# Patient Record
Sex: Male | Born: 1955 | Race: White | Hispanic: No | Marital: Married | Smoking: Current every day smoker
Health system: Southern US, Community
[De-identification: ages and names within clinical notes are randomized; demographics above are authoritative.]

## PROBLEM LIST (undated history)

## (undated) DIAGNOSIS — I252 Old myocardial infarction: Secondary | ICD-10-CM

## (undated) DIAGNOSIS — E785 Hyperlipidemia, unspecified: Secondary | ICD-10-CM

## (undated) DIAGNOSIS — N179 Acute kidney failure, unspecified: Secondary | ICD-10-CM

## (undated) DIAGNOSIS — Z720 Tobacco use: Secondary | ICD-10-CM

## (undated) DIAGNOSIS — I219 Acute myocardial infarction, unspecified: Secondary | ICD-10-CM

## (undated) DIAGNOSIS — Z951 Presence of aortocoronary bypass graft: Secondary | ICD-10-CM

## (undated) DIAGNOSIS — I2510 Atherosclerotic heart disease of native coronary artery without angina pectoris: Secondary | ICD-10-CM

## (undated) HISTORY — DX: Old myocardial infarction: I25.2

## (undated) HISTORY — DX: Hyperlipidemia, unspecified: E78.5

## (undated) HISTORY — DX: Tobacco use: Z72.0

## (undated) HISTORY — DX: Presence of aortocoronary bypass graft: Z95.1

## (undated) HISTORY — PX: CORONARY ARTERY BYPASS GRAFT: SHX141

## (undated) HISTORY — DX: Acute kidney failure, unspecified: N17.9

## (undated) HISTORY — PX: CORONARY ANGIOPLASTY WITH STENT PLACEMENT: SHX49

---

## 2002-10-07 ENCOUNTER — Inpatient Hospital Stay (HOSPITAL_COMMUNITY): Admission: EM | Admit: 2002-10-07 | Discharge: 2002-10-17 | Payer: Self-pay | Admitting: Unknown Physician Specialty

## 2002-10-07 ENCOUNTER — Encounter: Payer: Self-pay | Admitting: Internal Medicine

## 2002-10-07 ENCOUNTER — Encounter: Payer: Self-pay | Admitting: *Deleted

## 2002-10-13 ENCOUNTER — Encounter: Payer: Self-pay | Admitting: Surgery

## 2002-10-14 ENCOUNTER — Encounter: Payer: Self-pay | Admitting: Surgery

## 2002-10-15 ENCOUNTER — Encounter: Payer: Self-pay | Admitting: Surgery

## 2003-11-09 ENCOUNTER — Inpatient Hospital Stay (HOSPITAL_COMMUNITY): Admission: EM | Admit: 2003-11-09 | Discharge: 2003-11-10 | Payer: Self-pay

## 2004-10-02 ENCOUNTER — Ambulatory Visit: Payer: Self-pay

## 2006-09-20 ENCOUNTER — Ambulatory Visit: Payer: Self-pay | Admitting: Cardiology

## 2006-09-20 ENCOUNTER — Observation Stay (HOSPITAL_COMMUNITY): Admission: EM | Admit: 2006-09-20 | Discharge: 2006-09-23 | Payer: Self-pay | Admitting: Emergency Medicine

## 2009-01-10 ENCOUNTER — Encounter: Payer: Self-pay | Admitting: Cardiology

## 2009-01-11 ENCOUNTER — Ambulatory Visit: Payer: Self-pay | Admitting: Cardiology

## 2010-09-10 ENCOUNTER — Ambulatory Visit
Admission: RE | Admit: 2010-09-10 | Discharge: 2010-09-10 | Payer: Self-pay | Source: Home / Self Care | Admitting: Family Medicine

## 2010-09-10 ENCOUNTER — Encounter: Payer: Self-pay | Admitting: Family Medicine

## 2010-09-10 DIAGNOSIS — IMO0002 Reserved for concepts with insufficient information to code with codable children: Secondary | ICD-10-CM | POA: Insufficient documentation

## 2010-09-10 DIAGNOSIS — I252 Old myocardial infarction: Secondary | ICD-10-CM | POA: Insufficient documentation

## 2010-09-12 ENCOUNTER — Telehealth (INDEPENDENT_AMBULATORY_CARE_PROVIDER_SITE_OTHER): Payer: Self-pay | Admitting: *Deleted

## 2010-09-27 NOTE — Letter (Signed)
Summary: Out of Work  [facility]  [address]   [place], [place] [address]   Phone: [phone]  Fax: [phone]    September 10, 2010   Employee:  [employer]    To Whom It May Concern:   For Medical reasons, please excuse the above named employee from work today and tomorrow. He has a musculoskeletal chest injury (no evidence of cardiac related pain).      If you need additional information, please feel free to contact our office.         Sincerely,    Donna Christen MD

## 2010-09-27 NOTE — Assessment & Plan Note (Signed)
Summary: CHEST PAIN/WSE   Vital Signs:  Patient Profile:   55 Years Old Male CC:      sharp right side chest pain x 1pm today Height:     66 inches Weight:      251.8 pounds O2 Sat:      98 % O2 treatment:    Room Air Temp:     98.4 degrees F oral Pulse rate:   76 / minute Resp:     18 per minute BP sitting:   167 / 106  (left arm) Cuff size:   large  Pt. in pain?   yes    Location:   right chest    Intensity:   7    Type:       sharp  Vitals Entered By: Lajean Saver RN (September 10, 2010 8:13 PM)                   Updated Prior Medication List: VIAGRA 50 MG TABS (SILDENAFIL CITRATE) 1 by mouth 1 hour prior to sexual activity FISH OIL 1000 MG CAPS (OMEGA-3 FATTY ACIDS)  ASPIRIN 81 MG TBEC (ASPIRIN)  GARLIC OIL 3 MG CAPS (GARLIC)  BEE POLLEN 580 MG CAPS (BEE POLLEN)  B COMPLEX  TABS (B COMPLEX VITAMINS)  RED YEAST RICE EXTRACT 600 MG CAPS (RED YEAST RICE EXTRACT)   Current Allergies: No known allergies History of Present Illness Chief Complaint: sharp right side chest pain x 1pm today History of Present Illness:  Subjective:  Patient was pushing a wire connector into a receptacle on his truck at about 1PM today with his right hand when he felt a sudden stabbing sharp pain in his right anterior chest.  He took two regular aspirin tabs and took two more about 2 hours later.  The pain recurs with deep inspiration and certain movements of his right arm/shoulder.  No shortness of breath.  The pain is not present when he sitting quietly without movement, breathing quietly.  He states that he had a recent cold that lasted about two weeks, and still has a mild cough.  No fevers, chills, and sweats  He has a past history of MI and cardiac bypass surgery.   He smokes 1/2 pack per day.  He takes no prescription meds except Viagra.  REVIEW OF SYSTEMS Constitutional Symptoms      Denies fever, chills, night sweats, weight loss, weight gain, and fatigue.  Eyes       Denies  change in vision, eye pain, eye discharge, glasses, contact lenses, and eye surgery. Ear/Nose/Throat/Mouth       Denies hearing loss/aids, change in hearing, ear pain, ear discharge, dizziness, frequent runny nose, frequent nose bleeds, sinus problems, sore throat, hoarseness, and tooth pain or bleeding.  Respiratory       Denies dry cough, productive cough, wheezing, shortness of breath, asthma, bronchitis, and emphysema/COPD.  Cardiovascular       Complains of chest pain.      Denies murmurs and tires easily with exhertion.      Comments: right side   Gastrointestinal       Denies stomach pain, nausea/vomiting, diarrhea, constipation, blood in bowel movements, and indigestion. Genitourniary       Denies painful urination, kidney stones, and loss of urinary control. Neurological       Denies paralysis, seizures, and fainting/blackouts. Musculoskeletal       Denies muscle pain, joint pain, joint stiffness, decreased range of motion, redness, swelling, muscle weakness, and gout.  Skin       Denies bruising, unusual mles/lumps or sores, and hair/skin or nail changes.  Psych       Denies mood changes, temper/anger issues, anxiety/stress, speech problems, depression, and sleep problems. Other Comments: Patient c/o sudden right sided sharp chest pain today at 1pm. it is constant. He denies SOB, pain elsewhere and is not diaphoretic. EKG done.    Past History:  Past Medical History: Myocardial infarction, hx of  Past Surgical History: 5 bypass  Family History: Family History Diabetes 1st degree relative- mother Family History of Cardiovascular disorder- father  Social History: Occupation: Acupuncturist Married Current Smoker 1/2 PPD x 21 years Alcohol use-yes Drug use-no Regular exercise-no Smoking Status:  current Does Patient Exercise:  no Drug Use:  no   Objective:  Appearance:  Patient appears in no acute distress.  He is alert and oriented  Skin:  warm and  dry; no rash Eyes:  Pupils are equal, round, and reactive to light and accomdation.  Extraocular movement is intact.  Conjunctivae are not inflamed.  Mouth:  moist mucous membranes  Neck:  Supple.  No adenopathy is present.  No tenderness Lungs:  Clear to auscultation.  Breath sounds are equal.   Chest:  Tenderness to deep palpation in the right anterior axillary line. Right shoulder:  Full range of motion without pain. Heart:  Regular rate and rhythm without murmurs, rubs, or gallops.  Abdomen:  Nontender without masses or hepatosplenomegaly.  Bowel sounds are present.  No CVA or flank tenderness.  Extremities:  No edema.  No calf tenderness.   Pedal pulses are full and equal.  EKG:  Normal Chest X-ray with rib detail:   IMPRESSION: No evidence of right-sided rib fracture, pleural effusion or pneumothorax.  No acute cardiopulmonary process.   Assessment New Problems: STRAIN, CHEST WALL (ICD-848.8) FAMILY HISTORY DIABETES 1ST DEGREE RELATIVE (ICD-V18.0) MYOCARDIAL INFARCTION, HX OF (ICD-412) CHEST PAIN UNSPECIFIED (ICD-786.50)  MUSCULOSKELETAL CHEST PAIN (RIGHT PECTORALIS AREA) ? BRONCHITIS  Plan New Medications/Changes: CEFDINIR 300 MG CAPS (CEFDINIR) 1 by mouth q12hr  #20 x 0, 09/10/2010, Donna Christen MD  New Orders: T-DG Ribs Unilateral w/Chest*R* [71101] EKG w/ Interpretation [93000] Pulse Oximetry (single measurment) [94760] New Patient Level IV [[address]] Planning Comments:   Begin applying ice pack to chest 2 or 3 times daily.  Tylenol for pain Begin Omnicef, expectorant. Follow-up with PCP for elevated BP If symptoms become significantly worse during the night, proceed to the local emergency room.   The patient and/or caregiver has been counseled thoroughly with regard to medications prescribed including dosage, schedule, interactions, rationale for use, and possible side effects and they verbalize understanding.  Diagnoses and expected course of recovery discussed and  will return if not improved as expected or if the condition worsens. Patient and/or caregiver verbalized understanding.  Prescriptions: CEFDINIR 300 MG CAPS (CEFDINIR) 1 by mouth q12hr  #20 x 0   Entered and Authorized by:   Donna Christen MD   Signed by:   Donna Christen MD on 09/10/2010   Method used:   Print then Give to Patient   RxID:   [id number]   Patient Instructions: 1)  Apply ice pack for 30 to 45 minutes, 2 or 3 times daily today and tomorrow.   2)  Take Tylenol as needed for pain. 3)  Take plain Mucinex with plenty of fluids for cough. 4)  Followup with your family doctor for high blood pressure.  Orders Added: 1)  T-DG Ribs Unilateral  w/Chest*R* [71101] 2)  EKG w/ Interpretation [93000] 3)  Pulse Oximetry (single measurment) [94760] 4)  New Patient Level IV [[address]]

## 2010-09-27 NOTE — Progress Notes (Signed)
  Phone Note Outgoing Call Call back at [facility] Phone [phone]   Call placed by: Lajean Saver RN,  September 12, 2010 11:21 AM Call placed to: Patient Action Taken: Phone Call Completed Summary of Call: Callback: Patient reports the pain in his chest is no better or worse even with ice and aleve for pain. I referred him to his PCP

## 2011-01-08 NOTE — Assessment & Plan Note (Signed)
[facility]                            CARDIOLOGY OFFICE NOTE   Randall Hall, Randall Hall                        MRN:          [id number]  DATE:01/11/2009                            DOB:          1956/08/13    PRIMARY CARE Randall Hall:  Randall Agee, NP   REASON FOR PRESENTATION:  Evaluate the patient with dizziness and known  coronary artery disease.   HISTORY OF PRESENT ILLNESS:  The patient is 55 years old now.  He has a  history of coronary artery disease as described below.  We last saw him  in 2008 when he had some chest discomfort and had a cardiac  catheterization for evaluation of this.  Since that time, he has had no  further cardiovascular testing.  He denies any chest discomfort.  He is  active at work. However, it is clear he does not exercise routinely.  He  says with his current activities at work he does not get any chest  pressure, neck, or arm discomfort.  He does not describe any  palpitations, presyncope, or syncope.  Unfortunately, he is still  smoking cigarettes.  He was referred to me primarily because of  dizziness.  He had this at work and said that he did not think he could  drive.  He drives a truck.  He did not describe presyncope or syncope  but thinks like he was going backwards when he tried to get up in his  truck.  He said sometimes he wakes in the morning with dizziness and it  may last for several hours.  He downplays it and says it is not severe.  He has not been allowed to drive a truck until he gets this cleared.  We  did check orthostatic blood pressures and they are not positive.  He  does not describe visual disturbances or voice disturbances.  He just  feels slightly off balance.   PAST MEDICAL HISTORY:  Coronary artery disease (status post CABG.  The  last catheterization was in 2008.  LIMA to the LAD was patent, saphenous  vein graft to the PDA and posterolateral was patent about proximal 25%  stenosis, saphenous  vein graft to the diagonal at proximal luminal  irregularity but was otherwise widely patent.  Pre-RIMA to the obtuse  marginal was widely patent.  His EF was 60%.  He had severe native three-  vessel coronary artery disease.), ongoing tobacco abuse, erectile  dysfunction, dyslipidemia.   ALLERGIES:  None.   MEDICATIONS:  Vitamin E, garlic, herbal vitamin, aspirin, iron, ginseng,  fish oil, Bee pollen.   REVIEW OF SYSTEMS:  As stated in the HPI and otherwise negative for all  other systems.   PHYSICAL EXAMINATION:  GENERAL:  The patient is in no distress.  VITAL SIGNS:  Blood pressure 100/80 without an orthostatic blood  pressure drop, 74 and irregular.  HEENT:  Eyes are unremarkable.  Pupils are equal, round, and reactive to  light.  Fundi not visualized.  Oral mucosa unremarkable.  NECK:  No jugular venous distension at 45 degrees, carotid upstroke  brisk  and symmetric.  No bruits.  No thyromegaly.  LYMPHATICS:  No cervical, axillary, inguinal adenopathy.  LUNGS:  Clear to auscultation bilaterally.  BACK:  No costovertebral angle tenderness.  CHEST:  Unremarkable.  HEART:  PMI not displaced or sustained.  S1 and S2 are within normal  limits.  No S3, no S4.  No clicks, no rubs, no murmurs.  ABDOMEN:  Obese, positive bowel sounds, normal in frequency and pitch.  No bruits, no rebound, no guarding.  No midline pulsatile mass.  No  hepatomegaly.  No splenomegaly.  SKIN:  No rashes, no nodules.  EXTREMITIES:  Pulses 2+ throughout.  No edema.  No cyanosis or clubbing.  NEURO:  Oriented to person, place, and time.  Cranial nerves II through  XII grossly intact.  Motor grossly intact.   EKG, sinus rhythm, rate 62, axis within normal limits, intervals within  normal limits.  No acute ST-T wave changes.   ASSESSMENT AND PLAN:  1. Dizziness.  I would not suspect a cardiac etiology to this.  I      would not suspect dysrhythmia by his description.  He is not      orthostatic.  This may  be more an ENT problem.  I did not clear him      to drive, referred him back to Ms. Weeks to see if she wanted to      write the clearance letter based on this evaluation or send him to      ENT.  2. Tobacco.  I discussed with him the need to stop smoking ( greater      than 3 minutes).  3. Coronary artery disease.  He had his catheterization in 2008.  He      has had no new symptoms.  No further cardiovascular testing is      suggested.  However, I think with his lack of attention to      secondary risk reduction, this gentleman is at significant risk for      future cardiovascular events, and he and I discussed this and he      understands this.  4. Risk reduction.  I did review his lipids.  His LDL is 175.  His      triglycerides 203 and his HDL 37.  He has not complied with statins      in the past.  He took himself off of Zocor because of muscle aches.      He understands that he needs to be on statin.  I will defer to his      primary providers to see if they can convince him to do this.      Again, he is at very significant risk for future cardiovascular      events with his current lifestyle and nonadherence/noncompliance.  5. Followup.  I will see him in one year or sooner if he has any      problems.     Randall Rotunda, MD, [facility]  Electronically Signed    JH/MedQ  DD: 01/11/2009  DT: 01/12/2009  Job #: [id number]   cc:   Randall Agee, NP

## 2011-01-11 NOTE — Cardiovascular Report (Signed)
NAME:  Randall Hall, Randall Hall NO.:  [id number]   MEDICAL RECORD NO.:  [id number]                   PATIENT TYPE:  INP   LOCATION:  3705                                 FACILITY:  [facility]   PHYSICIAN:  Charlies Constable, M.D. [facility]              DATE OF BIRTH:  [birth year]   DATE OF PROCEDURE:  11/10/2003  DATE OF DISCHARGE:  11/10/2003                              CARDIAC CATHETERIZATION   CLINICAL HISTORY:  Mr. 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  He recently  developed chest pain and was admitted to the hospital with diagnosis of  unstable angina.  He was enrolled in the ACUITY trial and scheduled for  angiography.   PROCEDURE:  The procedure was performed via the right femoral artery using  arterial sheath and 6 French preformed coronary catheters. A front wall  arterial puncture was performed and Omnipaque contrast was used.  A LIMA  catheter was used for injection of the LIMA graft.  A left bypass graft  catheter was used for injection of the graft to the circumflex artery.  The  right femoral artery was closed with Angio-Seal at the end of the procedure.  The patient tolerated the procedure well and left the laboratory in  satisfactory condition.   RESULTS:  Left main coronary artery:  The left main coronary was free of  significant disease.   Left anterior descending artery:  The left anterior descending artery gave  rise to two septal perforators and a diagonal branch and then was completely  occluded.  The first diagonal branch was also completely occluded.   Circumflex artery:  The circumflex artery gave rise to an intermediate  branch, marginal branch and a posterior lateral branch.  There was competing  flow in the marginal branch.  There was 80% narrowing in the proximal to mid  vessel before the marginal branch and there was a 90% narrowing right at the  takeoff of the marginal  branch.   Right coronary artery:  The right coronary artery was completely occluded  proximally.   The saphenous vein graft to the posterior descending and first posterior  lateral branch of the right coronary artery was patent and functioned  normally.  This filled the first posterior descending and three posterior  lateral branches.   The saphenous vein graft to the marginal branch of the circumflex artery was  patent and functioned normally.   The saphenous vein graft to the diagonal branch of the LAD was patent and  functioned normally.   The LIMA graft to the LAD was patent and functioned normally.   LEFT VENTRICULOGRAM:  The left ventriculogram performed in the RAO  projection showed good wall motion with no areas of hypokinesis.  The  estimated ejection fraction was 60%.   CONCLUSION:  1. Coronary artery disease status post coronary artery bypass graft  surgery     February 2004.  2. Severe native vessel disease with total occlusion of the left anterior     descending and right coronary arteries and 80 and 90% stenoses in the     proximal and mid circumflex artery.  3. Patent vein graft to the posterior descending and posterior lateral     branch of the right coronary artery, patent vein graft to the marginal     branch of the circumflex artery, patent vein graft to the diagonal branch     of the left anterior descending and patent left internal mammary artery     graft to the left anterior descending.  4. Normal left ventricular function.   RECOMMENDATIONS:  All the patient's grafts were patent.  He does have an 80  and 90% stenosis in the native circumflex artery which compromises small  posterior lateral branch, but this was present on the preoperative film and  not grafted.  I do not think this is likely the cause of his symptoms.  He  does have symptoms of reflux and I suspect this is the cause of his recent  admission symptoms.  Will plan treatment with proton pump  inhibitor and  discharge probably later today.                                               Charlies Constable, M.D. [facility]    BB/MEDQ  D:  11/10/2003  T:  11/11/2003  Job:  [id number]   cc:   Paulita Cradle, NP  [facility]

## 2011-01-11 NOTE — Cardiovascular Report (Signed)
NAME:  Randall Hall, Randall Hall NO.:  [id number]   MEDICAL RECORD NO.:  [id number]          PATIENT TYPE:  INP   LOCATION:  2031                         FACILITY:  [facility]   PHYSICIAN:  Rollene Rotunda, MD, FACCDATE OF BIRTH:  1956/05/01   DATE OF PROCEDURE:  09/22/2006  DATE OF DISCHARGE:                            CARDIAC CATHETERIZATION   PRIMARY CARE PHYSICIAN:  Paulita Cradle, Nurse Practitioner, [facility]   PROCEDURES:  1. Left heart catheterization.  2. Coronary arteriography.   INDICATIONS:  Evaluate patient with chest pain suggestive of unstable  angina.  He had previous CABG.   PROCEDURAL NOTE:  Left heart catheterization was performed via the right  femoral artery.  The artery was cannulated using anterior wall puncture.  A #6-French arterial sheath was inserted via the modified Seldinger  technique.  Preformed Judkins and a pigtail catheter were utilized.  The  patient tolerated the procedure well and left the lab in stable  addition.   RESULTS:   HEMODYNAMICS:  LV 136/22, AO 127/95.   CORONARIES:  Left main had distal 25% stenosis.   The LAD had mid subtotal stenosis.  There was 70% stenosis after a  second diagonal.  The distal LAD wrapped the apex.  It was seen to fill  via the LIMA.  It did backfill the moderate-size second diagonal across  the 70% lesion.  There was a first diagonal which was large and occluded  at the ostium and filled via a vein graft.   Circumflex had a proximal 70% stenosis.  There was mid 80-90% stenosis.  There was a ramus intermediate which was moderate size and had mid 30%  stenosis.  A mid obtuse marginal was large and normal.  It was seen  predominately to fill via the vein graft.  The right coronary artery was  a dominant vessel.  It was occluded in the mid segment.  There was a mid  long 25% stenosis.  The distal vessel was seen to fill via a sequential  vein graft.   GRAFTS:  LIMA to the  LAD was widely patent.   Saphenous vein graft sequentially to the PDA and posterolateral was  widely patent with proximal 25% stenosis.   Saphenous vein graft to the diagonal had proximal luminal irregularities  but was otherwise widely patent.   A free RIMA to an obtuse marginal was normal.   LEFT VENTRICULOGRAM:  The left ventriculogram was obtained in the RAO  projection.  The EF was 60% with normal wall motion.   CONCLUSION:  1. Severe three-vessel native coronary disease.  2. Widely patent grafts.  3. Normal left ventricular function.   PLAN:  Based on the above, there is no obvious culprit lesion for his  chest discomfort.  He could have discomfort from small vessels versus a  nonanginal etiology.  At this point, he will continue to be managed  medically and with aggressive secondary risk reduction per Mrs.  Steadman.      Rollene Rotunda, MD, [facility]  Electronically Signed     JH/MEDQ  D:  09/22/2006  T:  09/22/2006  Job:  [id number]   cc:   Paulita Cradle, NP

## 2011-01-11 NOTE — Cardiovascular Report (Signed)
   NAME:  Randall Hall, Randall Hall NO.:  [id number]   MEDICAL RECORD NO.:  [id number]                   PATIENT TYPE:  INP   LOCATION:  2011                                 FACILITY:  [facility]   PHYSICIAN:  Rollene Rotunda, M.D. [facility]            DATE OF BIRTH:  12/29/1955   DATE OF PROCEDURE:  10/08/2002  DATE OF DISCHARGE:                              CARDIAC CATHETERIZATION   DATE OF BIRTH:  1956/01/06   PRIMARY CARE PHYSICIAN:  Paulita Cradle, N.P., [facility].   PROCEDURE:  Left heart catheterization/coronary arteriography.   INDICATIONS:  Evaluate patient with unstable angina and an ECG suggestive of  a recent inferior myocardial infarction.  He had slight ST segment changes.   DESCRIPTION OF PROCEDURE:  Left heart catheterization was performed via the  right femoral artery. The artery was cannulated using the anterior wall  puncture. A #6 French arterial sheath was inserted via the modified  Seldinger technique. Preformed Judkins and a pigtail catheter were utilized.  The patient tolerated the procedure well and left the lab in stable  condition.   HEMODYNAMICS:  1. LV 104/20, AO 107/87.  2. The left main had distal 25% stenosis.  3. The LAD had a long mid 90% stenosis.  The first diagonal was large with     ostial 70% stenosis.  4. The circumflex in the AV groove had a proximal 99% stenosis.  5. A ramus intermediate was a somewhat small branching vessel with proximal     30% stenosis.  A mid obtuse marginal had ostial 70% stenosis and was a     large vessel.  6. The right coronary artery was a large dominant vessel.  It was occluded     proximally.  There were left to right and right to right collaterals     demonstrating two large diagonal branches.   LEFT VENTRICULOGRAM:  The left ventriculogram was obtained in the RAO  projection. The EF was 50% with mild to moderate inferior akinesis, mild  anterior mid and distal  hypokinesis.    CONCLUSION:  Three-vessel coronary artery disease with mild left ventricular  dysfunction.   PLAN:  The patient will have a CVTS consult.                                                   Rollene Rotunda, M.D. [facility]    JH/MEDQ  D:  10/08/2002  T:  10/09/2002  Job:  [id number]   cc:   Paulita Cradle, N.P.  [facility]

## 2011-01-11 NOTE — Discharge Summary (Signed)
NAME:  Randall Hall, Randall Hall NO.:  [id number]   MEDICAL RECORD NO.:  [id number]                   PATIENT TYPE:  INP   LOCATION:  3705                                 FACILITY:  [facility]   PHYSICIAN:  Rollene Rotunda, M.D.                DATE OF BIRTH:  12-19-1955   DATE OF ADMISSION:  11/09/2003  DATE OF DISCHARGE:  11/10/2003                           DISCHARGE SUMMARY - REFERRING   DISCHARGE DIAGNOSES:  1. Chest pain, resolved.  2. Coronary artery disease status post coronary artery bypass graft.  3. Family history of coronary artery disease.  4. Former tobacco abuse.  5. Dyslipidemia, treated.  6. Ejection fraction 50%.   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  The following grafts were performed:  LIMA to the LAD, right radial to the OM, saphenous vein graft to the  diagonal, sequential saphenous vein graft to the PD and PL branch.   After getting out of bed on the date of admission he experienced dull aching  pain at his left shoulder, he took an aspirin and repeated this after 15  minutes, the pain persisted and then he presented to the emergency room.   Ultimately he went to the cardiac catheterization lab under the care of Dr.  Charlies Constable.  He was found to have native LAD and RCA total, circumflex had  an 80-90% midstenosis which was not new.  His grafts were normal, LV gram  was normal with an EF of 60%, his pain was not felt to be anginal in nature  and he was discharged to home in stable condition.   Lab studies during the patient's stay included potassium 4.5, BUN 14,  creatinine 1.2, enzymes were negative, hemoglobin 13.4, hematocrit 38.7,  platelets 235, white count 10.4, C-reactive protein 0.4.  EKG normal sinus  rhythm, rate 64, with Q waves in the inferior leads, otherwise no acute   changes.   His discharge medications are unchanged from his previous medications; baby  aspirin daily, he was on Zocor however, complained of arthralgias therefore,  we started Zetia 10 mg a day.  I have given him a prescription for  sublingual nitroglycerin p.r.n. chest pain.  He is on multiple herbal  medicines and vitamins and these may be  resumed.  He has an appointment to follow up with Dr. Antoine Poche in April and  I have asked him to keep this appointment.  He is to call if he has any  trouble with swelling or bruising of his groin.  He is asked not to drive  for 2 days or no heavy lifting.  He is to return to work on November 16, 2003.      Guy Franco, P.A. [facility]  Rollene Rotunda, M.D.    LB/MEDQ  D:  11/10/2003  T:  11/12/2003  Job:  [id number]   cc:   Lupita Leash A. Bevelyn Buckles, N.P.  [facility]

## 2011-01-11 NOTE — Discharge Summary (Signed)
NAME:  Randall Hall, Randall Hall NO.:  [id number]   MEDICAL RECORD NO.:  [id number]                   PATIENT TYPE:  INP   LOCATION:  2004                                 FACILITY:  [facility]   PHYSICIAN:  Evelene Croon, M.D.                  DATE OF BIRTH:  [birth year]   DATE OF ADMISSION:  10/07/2002  DATE OF DISCHARGE:  10/17/2002                                 DISCHARGE SUMMARY   PRIMARY ADMITTING DIAGNOSIS:  Chest pain.   ADDITIONAL/DISCHARGE DIAGNOSES:  1. Severe three-vessel coronary artery disease.  2. Unstable angina.  3. Status post recent myocardial infarction.  4. Hyperlipidemia.  5. Postoperative hypertension.   PROCEDURES PERFORMED:  1. Cardiac catheterization.  2. Coronary artery bypass grafting x 5 (left internal mammary artery to the     left anterior descending, right radial artery to the obtuse marginal,     saphenous vein graft to the diagonal, sequential saphenous vein graft to     posterior descending and posterolateral arteries).  3. Endoscopic vein harvest from left thigh to lower leg.   HISTORY OF PRESENT ILLNESS:  The patient is a 55 year old white male with no  prior cardiac history.  Over the 7-10 days prior to admission, he had  intermittent pain in his left upper chest and shoulder radiating to his left  biceps.  The pain typically occurred with exertion, was relieved with rest,  and had no associated symptoms.  On the day of admission, he had had the  pain off and on throughout most of the day and was seen at [facility].  An EKG performed there was normal.  He was  referred to the ER at [facility] for further evaluation  and treatment.   HOSPITAL COURSE:  His CPK on admission was 162 with an MB of 5.8.  The  troponin peaked at 0.31.  He was seen by cardiology and ultimately underwent  cardiac catheterization which showed severe three-vessel coronary artery  disease, which was not  felt to be amenable to percutaneous intervention.  A  cardiothoracic surgery consultation was obtained and it was felt the patient  was a good candidate for surgical revascularization.  He was taken to the  operating room on October 13, 2002, where he underwent CABG x 5 by Evelene Croon, M.D., with the above-noted grafts.  He tolerated the procedure well  and was transferred to the SICU in stable condition.  He was able to be  extubated shortly after surgery.  He was hemodynamically stable and doing  well on postoperative day #1.  At that time, he was felt to be ready for  transfer to the floor.  Postoperatively, he has done well.  He was initially  started on Imdur for his right radial artery graft, as well as Lopressor.  He had some difficulty maintaining systolic blood pressures  over 80-90,  however, and therefore the Imdur was discontinued.  He was continued on low-  dose beta blocker and was able to maintain systolic blood pressures of  around 90-100.  He has otherwise done very well.  He has been walking in the  halls without difficulty.  He has remained afebrile.  All of his other vital  signs have been stable.  He is back down to within 6 pounds of his  preoperative weight.  He has been tolerating a regular diet and is having  normal bowel and bladder function.  All of his surgical incision sites are  healing well.  His right arm site looks good and he is maintaining good  collateral flow and perfusion to his arm and hand.  It is felt that he is  ready for discharge home at this time.    DISCHARGE MEDICATIONS:  1. Enteric-coated aspirin 325 mg daily.  2. Toprol XL 25 mg daily.  3. Tylox one to two q.4h. p.r.n. for pain.   ACTIVITY:  He is to refrain from driving, heavy lifting, or strenuous  activity.  He may continue daily walking and use of his incentive  spirometer.   WOUND CARE:  He is asked to shower daily and clean his incisions with soap  and water.   FOLLOW-UP:  He  will see Rollene Rotunda, M.D., back in the office in two  weeks and have a chest x-ray at that time.  He will then follow up in three  weeks with Evelene Croon, M.D., and the CV[facility] office will schedule this  appointment.  He is asked to bring his chest x-ray for Dr. Laneta Simmers to review.  He is asked to call our office if he experiences any redness, swelling, or  increased drainage from the incision sites, fever greater than 101 degrees,  chest pain, or shortness of breath symptoms.     Coral Ceo, P.A.                        Evelene Croon, M.D.    GC/MEDQ  D:  10/17/2002  T:  10/17/2002  Job:  [id number]   cc:   [facility]   [facility]

## 2011-01-11 NOTE — Discharge Summary (Signed)
NAME:  BOONE, GEAR NO.:  [id number]   MEDICAL RECORD NO.:  [id number]          PATIENT TYPE:  INP   LOCATION:  2031                         FACILITY:  [facility]   PHYSICIAN:  Rollene Rotunda, MD, FACCDATE OF BIRTH:  [birth year]   DATE OF ADMISSION:  09/20/2006  DATE OF DISCHARGE:  09/23/2006                               DISCHARGE SUMMARY   PHYSICIANS:  Primary cardiologist is Dr. Lewayne Bunting.  Primary physician  is Dr. Birdena Jubilee.   PROCEDURES PERFORMED DURING HOSPITALIZATION:  Cardiac catheterization  completed on September 22, 2006, by Dr. Antoine Poche  A.  Graft to LIMA and LAD widely patent, SVG sequential, PDA and  diagonal 1 proximal 25%, SVG to diagonal proximal luminal  irregularities, RIMA (free) to OM normal, LAD mid subtotal stenosis 80%,  diagonal 1 large 100%, circ proximal 30%, mid 80% to 90%, right coronary  artery 100%, mid long 25%, RI moderate mid 30%.  B.  Severe three-vessel and native-vessel coronary artery disease,  widely patent grafts.  Normal LV function.  Treat medically.   PRINCIPAL DIAGNOSES:  1. Unstable angina.  2. Status post cardiac catheterization with patent grafts and native      three-vessel coronary artery disease.   SECONDARY DIAGNOSES:  1. Ongoing tobacco abuse.  2. Dyslipidemia.   HISTORY OF PRESENT ILLNESS:  A 55 year old Caucasian male with a history  of CAD as described above and coronary artery bypass grafting performed  in 2004 was admitted secondary to sudden onset of sharp upper chest pain  that was radiating to the back.  The pain turned dull and had  associated tightness.  The pain also radiated to the left arm and felt  some also in the neck.   The patient drove to [facility] from [place] to avoid going to  [place] [facility] for unknown reason.  The patient continued to have some  additional discomfort while driving and he took nitroglycerin with  subsequent relief.  The patient also continued to have some mild  low-  grade nausea and diaphoresis with the pain and was admitted for further  evaluation.   The patient was seen and examined by Theodore Demark, PA-C, and Dr.  Andee Lineman on September 21, 2006.  The patient had serial cardiac enzymes  completed and they were found to be negative throughout hospitalization.  The patient had pain relief with nitroglycerin and remained stable  throughout hospitalization.   The patient had subsequent cardiac catheterization as described above.  Please see dictated cardiac catheterization note for thorough  explanation.  The patient was treated medically after catheterization as  grafts were found to be patent.  The patient was counseled on smoking  cessation during hospitalization as he continued to smoke despite known  coronary artery disease and bypass grafting.  The patient was started on  Chantix in a loading dose which is to  be continued on discharge.  The patient was also found to have  hypercholesterolemia with a cholesterol of 243, triglycerides 338, HDL  29 with cholesterol of 146.  At this time, the patient was not started  on a  statin, but will be started on discharge.      Bettey Mare. Lyman Bishop, NP      Rollene Rotunda, MD, [facility]  Electronically Signed    KML/MEDQ  D:  09/23/2006  T:  09/24/2006  Job:  [id number]

## 2011-01-11 NOTE — Consult Note (Signed)
NAME:  Randall Hall, Randall Hall NO.:  [id number]   MEDICAL RECORD NO.:  [id number]                   PATIENT TYPE:  INP   LOCATION:  2011                                 FACILITY:  [facility]   PHYSICIAN:  Evelene Croon, M.D.                  DATE OF BIRTH:  02-Jun-1956   DATE OF CONSULTATION:  10/09/2002  DATE OF DISCHARGE:                                   CONSULTATION   REASON FOR CONSULTATION:  Severe three-vessel coronary artery disease.   HISTORY OF PRESENT ILLNESS:  This patient is a 55 year old gentleman with  multiple cardiac risk factors, but no prior cardiac history, who presented  with a 10-day history of left upper chest and shoulder pain, radiating into  his left biceps.  It typically occurred with exertion, but was also  occurring occasionally at rest.  He described his pain as moderately severe,  and usually relieved with rest.  On the day of admission he had pain  intermittently most of the day, but his electrocardiogram was normal on  admission.  His CPK was 162 with an MB of 5.8.  Troponin  level was 0.15  initially and then 0.31.  He therefore ruled in for recent myocardial  infarction.  He underwent cardiac catheterization yesterday which showed  severe three-vessel disease.  The left main had distal 25% narrowing.  The  LAD had a long, mid vessel 90% stenosis.  There was a large diagonal that  had 70% stenosis.  The left circumflex had about 99% stenosis in the AV  groove portion proximally.  There was a major obtuse marginal that had 70%  osteal stenosis.  The right coronary artery was occluded proximally with  left to right collaterals filling two large marginal branches.  The left  ventricular ejection fraction was about 50% with mild to moderate inferior  akinesis.  There is mild anterior hypokinesis.  Since admission the patient  has remained pain free.   MEDICATIONS:  None prior to admission.   PAST MEDICAL HISTORY:  1. He has a  history of hyperlipidemia.  2. He has no history of hypertension or diabetes.   PAST SURGICAL HISTORY:  He has no previous surgery.   SOCIAL HISTORY:  He smokes about one pack of cigarettes per day and does  drink some alcohol.  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   FAMILY HISTORY:  His father died of myocardial infarction at age 81.   ALLERGIES:  None.   REVIEW OF SYSTEMS:  GENERAL:  He has had no fever or chills.  He denies  fatigue.  He has had about a five pound weight gain over the past month.  Eyes:  Negative.  ENT:  Negative.  ENDOCRINE:  He denies diabetes and  hypothyroidism.  CARDIOVASCULAR:  As above.  He has had exertional chest  pain.  He denies shortness of breath.  He has no PND or orthopnea.  He  denies peripheral edema or palpitations.  RESPIRATORY:  He has had no cough  or sputum production.  GASTROINTESTINAL:  He denies dysphagia and nausea.  He has had no melena or bright red blood per rectum.  GENITOURINARY:  He has  had no dysuria or hematuria.  NEUROLOGIC:  He denies any focal weakness or  numbness.  He denies dizziness and syncope.  VASCULAR:  He has had no  claudication or phlebitis.  MUSCULOSKELETAL:  He denies arthralgias and  myalgias.  PSYCHIATRIC:  Negative.   PHYSICAL EXAMINATION:  VITAL SIGNS:  His blood pressure is 100/55 and his  pulse is 80 and regular.  Respiratory rate is 16 and unlabored.  GENERAL:  He is a well-developed, mildly obese, white male in no distress.  HEENT:  Exam shows him to be normocephalic and atraumatic.  The pupils are  equal and reactive to light and accommodation.  Extraocular muscles are  intact.  Throat is clear.  NECK:  Exam shows normal carotid pulses bilaterally.  There are no bruits.  There is no adenopathy or thyromegaly.  CARDIAC:  Shows regular rate and rhythm with normal S1 and S2.  There is no  murmur, rub, or gallop.  LUNGS:  Clear.  ABDOMEN:  Shows active bowel sounds.  His abdomen is soft, obese, and   nontender.  There are no palpable masses or organomegaly.  EXTREMITIES:  Shows no peripheral edema.  Pedal pulses are palpable  bilaterally.  NEUROLOGIC:  Shows him to be alert and oriented x3.  Motor and sensory exams  are grossly normal.  SKIN:  Warm and dry.   LABORATORY DATA:  Shows normal electrolytes, BUN of 15, and creatinine of  1.7.  Liver function profile was normal.  His hemoglobin was 15.8 with a  platelet count of 211,000.   Electrocardiogram shows normal sinus rhythm with old inferior myocardial  infarction.  There is loss of R-wave height, suggesting possible anterior  infarction.   IMPRESSION:  The patient has severe three-vessel coronary artery disease,  status post recent inferior myocardial infarction.  I agree that coronary  artery bypass graft surgery is the best long term treatment for this  relatively young patient followed by maximum cardiac risk factor reduction  including smoking cessation.  I have discussed the operative procedure of  coronary artery bypass surgery with him and his family, including possible  use of bilateral internal mammary autografts or a radial artery graft.  We  will obtain peripheral vascular studies to evaluate his upper extremity  vasculature.  He is left-handed.  I have discussed alternatives to surgery,  benefits, and risks.  They understand and agree to proceed with surgery.  I  will need to examine the schedule to decide when he can be done.                                                 Evelene Croon, M.D.    BB/MEDQ  D:  10/09/2002  T:  10/09/2002  Job:  [id number]   cc:   Evelene Croon, M.D.  [address]  [place]  [place] [address]  Fax: [phone]

## 2011-01-11 NOTE — H&P (Signed)
NAME:  Randall Hall, Randall Hall NO.:  [id number]   MEDICAL RECORD NO.:  [id number]          PATIENT TYPE:  INP   LOCATION:  1825                         FACILITY:  [facility]   PHYSICIAN:  Learta Codding, MD,FACC DATE OF BIRTH:  10-29-1955   DATE OF ADMISSION:  09/20/2006  DATE OF DISCHARGE:                              HISTORY & PHYSICAL   REFERRING PHYSICIAN:  Dr. Birdena Jubilee.   CARDIOLOGIST:  Dr. Angelina Sheriff.   REASON FOR ADMISSION:  Substernal chest pain.   HISTORY OF PRESENT ILLNESS:  The patient is a 55 year old white male  with a history of coronary artery disease status post coronary artery  bypass grafting performed in 2004.  The patient received 5 vessel bypass  graft with a LIMA to the LAD and radial artery graft on the first  marginal branch, a vein graft to diagonal branch of the LAD and a  sequential vein graft to the PDA and posterolateral branch of the right  coronary artery.  The patient had been doing well up until recently.  He  stated he had sudden onset of sharp upper chest pain that was radiating  to the back.  This pain turned dull and with an associated tightness.  There was radiation to the left arm and felt some tingling in the neck  and the left arm.  The patient took 2 aspirin.  His symptoms  approximately started around 10:30 this morning with minimal exertion.  The pain was rated a 4-5/10.  Subsequently, his pain improved with  aspirin.  However, the patient was very concerned as his symptoms were  similar to his prior presentation in the setting of myocardial  infarction.  The patient lives in [place] and did not want to go to the  local [place] ER and drove himself with his wife down to [facility].  He still had some additional pain and received nitroglycerin  with subsequent relief.  The patient did have a cardiac catheterization  in 2005 which demonstrated the patent stents.  He also reports some  associated nausea and  diaphoresis with his pain today.  In my interview,  his chest pain has resolved, and his EKG does not show any acute  ischemic changes.   ALLERGIES:  No known drug allergies.   PAST MEDICAL HISTORY:  1. Bypass grafting as outlined above.  Catheterization most recently      in 2005  2. Dyslipidemia.  3. Ongoing tobacco use.   SOCIAL HISTORY:  The patient lives in [place] close to [place] with  his wife.  He is married.  He has a 49 pack-year history of smoking.  Denies alcohol use.  He is a Naval architect.   FAMILY HISTORY:  Notable for father who died from myocardial infarction  and mother also has history of cardiac bypass grafting.  He also has a  brother who has coronary artery disease.   REVIEW OF SYSTEMS:  No fever, chills, or sweats.  No headache or sore  throat.  SKIN:  No rash or lesions.  Positive for chest pain but no  shortness  of breath or dyspnea on exertion.  GU:  No frequency or  dysuria, no weakness or numbness.  Positive for depression and anxiety.  No myalgia or arthralgia.  Positive for nausea but no vomiting.  GERD  symptoms.  No polyuria or polydipsia.   PHYSICAL EXAMINATION:  VITAL SIGNS:  Blood pressure 141/94, heart rate  97 beats per minute, temperature 98.3, respirations 18.  GENERAL:  Well-nourished white male in no apparent distress.  HEENT:  Pupils equal, round, and reactive to light and accommodation.  NECK:  Supple, no lymphadenopathy, no carotid bruits.  LUNGS:  Clear breath sounds bilaterally.  HEART:  Regular rate and rhythm, normal S1, S2.  No murmurs, rubs, or  gallops.  ABDOMEN:  Soft, nontender with no rebound or guarding.  Good bowel  sounds.  EXTREMITIES:  No cyanosis, clubbing, or edema.  NEUROLOGIC:  The patient alert, oriented, and grossly nonfocal.   Chest x-ray is pending.  EKG had no acute abnormalities.   LABORATORY WORK:  Sodium 136, potassium 4.0, BUN 14.  Point of care  markers negative x1.   PROBLEMS:  1. Coronary  artery disease with recurrence of substernal chest pain.  2. Status post coronary artery bypass grafting with left internal      mammary artery graft and radial graft and 3 vein grafts.  Details      as outlined above.  3. Tobacco use.  4. Dyslipidemia.  5. Hypertension, poorly controlled.   PLAN:  1. The patient's symptoms are consistent for unstable angina.  I have      discussed with the patient the need to proceed with cardiac      catheterization, and he is willing to proceed.  2. The patient will be placed on aspirin, heparin, and nitroglycerin.  3. The patient will be monitored, and we will proceed with cardiac      catheterization on Monday.      Learta Codding, MD,FACC  Electronically Signed     GED/MEDQ  D:  09/20/2006  T:  09/20/2006  Job:  [id number]   cc:   Rollene Rotunda, MD, [facility]

## 2011-01-11 NOTE — H&P (Signed)
NAME:  Randall Hall, Randall Hall NO.:  [id number]   MEDICAL RECORD NO.:  [id number]                   PATIENT TYPE:  EMS   LOCATION:  MAJO                                 FACILITY:  [facility]   PHYSICIAN:  Randall Hall, M.D. [facility]            DATE OF BIRTH:  09/16/1955   DATE OF ADMISSION:  10/07/2002  DATE OF DISCHARGE:                                HISTORY & PHYSICAL   HISTORY OF PRESENT ILLNESS:  Randall Hall is a 55 year old gentleman with no  prior cardiac history, who presents with left shoulder pain.  The patient  states that for the past 10 days, he has had pain in his left upper chest  and shoulder with radiation to his left biceps.  There is no associated  nausea, vomiting, shortness of breath, or diaphoresis.  The pain typically  occurs with exertion and is relieved with rest.  However, he has also had  these symptoms at rest.  The pain is not pleuritic and not related to  position.  He had the pain intermittently most of the day today and was seen  at [facility].  His electrocardiogram was normal,  and he was referred to the emergency room.  He is presently pain free.   MEDICATIONS:  He takes no medications on a regular basis.   ALLERGIES:  No known drug allergies.   SOCIAL HISTORY:  He does smoke and also consumes alcohol.   FAMILY HISTORY:  Positive for coronary artery disease, as his father died of  myocardial infarction at age 38.   PAST MEDICAL HISTORY:  Hyperlipidemia, but there is no hypertension or  diabetes mellitus.   PAST SURGICAL HISTORY:  He has had no previous surgery.   REVIEW OF SYSTEMS:  He denies any headaches or fevers or chills.  There is  no productive cough or hemoptysis.  There is no dysphagia or odynophagia,  melena or hematochezia.  There is no dysuria or hematuria.  There is no rash  or seizure activity.  There is no orthopnea, PND, or pedal edema.  There is  no claudication.  The remainder of  the systems are negative.   PHYSICAL EXAMINATION:  VITAL SIGNS:  Blood pressure 129/79, pulse 78.  GENERAL:  Well-developed, well-nourished, in no acute distress.  His skin is  warm and dry.  He does not appear to be depressed.  DIGITS:  No clubbing.  HEENT:  Unremarkable with no __________ .  NECK:  Supple with a normal upstroke bilaterally, and there are no bruits  noted.  There is no jugular venous distension, no thyromegaly noted.  CHEST:  Clear to auscultation, normal expansion.  CARDIOVASCULAR:  Regular rate and rhythm.  Normal S1 and S2.  There are no  murmurs, rubs, or gallops noted.  ABDOMEN:  Nontender, nondistended, positive bowel sounds, no  hepatosplenomegaly, and no masses appreciated.  There is no abdominal bruit.  He has 2+ femoral pulses bilaterally and no bruits.  EXTREMITIES:  No edema, and I can palpate no cords.  He has 2+ dorsalis  pedis pulses bilaterally.  NEUROLOGIC:  Grossly intact.   LABORATORY DATA:  His electrocardiogram shows normal sinus rhythm at a rate  of 75.  The axis is normal.  There is an inferolateral myocardial  infarction, most likely recent with inferolateral T-wave inversion.   DIAGNOSES:  1. Probable recent inferolateral myocardial infarction.  2. Tobacco use.  3. History of hyperlipidemia.   PLAN:  [name] presents with left shoulder pain that is consistent with  angina.  His electrocardiogram shows a recent inferolateral infarct.  We  will admit and cycle enzymes.  We will treat him with aspirin, Integrilin,  heparin, nitroglycerin, Lopressor, and Zocor.  I discussed the risks and  benefits of cardiac catheterization with Randall Hall, and he agrees to  proceed.  We also discussed risk factor modification, including  discontinuing his tobacco use.  We will make further recommendations once we  have results of his catheterization.                                               Randall Hall, M.D. [facility]    BC/MEDQ  D:   10/07/2002  T:  10/07/2002  Job:  [id number]

## 2011-01-11 NOTE — H&P (Signed)
NAME:  Randall Hall, Randall Hall NO.:  [id number]   MEDICAL RECORD NO.:  [id number]                   PATIENT TYPE:  INP   LOCATION:  3705                                 FACILITY:  [facility]   PHYSICIAN:  Rollene Rotunda, M.D.                DATE OF BIRTH:  1956/07/24   DATE OF ADMISSION:  11/09/2003  DATE OF DISCHARGE:  11/10/2003                                HISTORY & PHYSICAL   CARDIOLOGIST:  Dr. Antoine Poche who he sees in [place].   PRIMARY CARE PHYSICIAN:  Dr. Mamie Laurel, [facility].   PRESENTING CIRCUMSTANCE:  This morning, my shoulder started to hurt, then  my left arm went numb.   HISTORY OF PRESENT ILLNESS:  Randall Hall is a 56 year old male. He has a  prior history of acute inferior myocardial infarction in 11/01/2002.  Catheterization demonstrated severe three-vessel coronary artery disease.  The catheterization is as follows:  Left main distal 25% LAD 90% mid point,  first diagonal ostial 70%, AV circumflex 99%, obtuse marginal ostial 70%,  total proximal RCA, ejection fraction 50%. He has subsequent coronary artery  bypass graft surgery by Dr. Laneta Simmers on October 13, 2002. Bypasses were  placed from the LIMA to the LAD from the right radial artery to the obtuse  marginal, a reverse saphenous vein graft to the diagonal, and a sequential  reversed saphenous vein graft to the posterior descending and the  posterolateral branch. He has done well after his coronary bypass surgery.  He has stopped smoking. 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 Of note today after getting  out of bed, he experienced a dull aching pain at the left shoulder blade. He  rated his pain as a 3/10. It was persistent. He took 81 mg of aspirin and  then aspirin tablet 15 minutes later. The pain did persist. It was not  positional. He did not have diaphoresis, dyspnea, or nausea or vomiting. The  patient notes that this pain is effecting exactly the same parts of the body  as the pain of his inferior myocardial infarction in 03-08-04only not  quite as pronounced. The patient drove with his wife to [facility]  and was given nitroglycerin in the emergency room with resolution of pain.  He also received some morphine. The patient at the time of examination is  pain free in no acute distress. The patient also complains of pain when he  bears down with the right arm and shoulder ___________. This has been  persistent for quite some time. The patient is worried that he might have  compromised his median sternotomy incision and the integrity of the rib  closure after surgery.   ALLERGIES:  No known drug allergies.   MEDICATIONS AT PRESENT:  1. Zocor 40 mg at bedtime which he quit secondary  to myopathy two weeks ago.  2. Enteric-coated aspirin 81 mg daily.  3. The patient was on Toprol-XL 25 mg, but he is now off it secondary to     hypotension.  4. The patient also takes a garlic pill, olive leaf, flax seed, fish oil     capsule 1,000 mg a day, elderberry, ginseng, and an iron pill. He also     takes cod liver oil pills.   PAST MEDICAL HISTORY:  1. Finding of severe three-vessel coronary artery disease in the setting of     acute myocardial infarction.  2. 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  3. Dyslipidemia.  4. Very strong family history of premature coronary artery disease.  5. Tobacco habituation, having quit 2004 with a 49-pack-year history.   The patient does not describe any symptoms attributable to hypertension,  cerebral vascular accident, diabetes mellitus, pulmonary embolism, deep vein  thrombosis, or thyroid disease. He has never had GI bleed.   SOCIAL HISTORY:  The patient lives in [place] with his wife. He has four  children, all of whom are alive and well. 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 He  does not smoke cigarettes since February  2004. Before that, he smoked one and a half packs per day. The patient  currently takes two to three mixed drinks per weekend. Does not use  recreational drugs.   FAMILY HISTORY:  His mother is still living at age 2. She is status post  coronary artery bypass graft surgery x4. His father died of a massive  myocardial infarction at age 107. He has one brother who had stent placed at  age 33, three sisters who are alive and well and no diabetes or heart  disease.   REVIEW OF SYSTEMS:  The patient is not complaining of fevers, chills, or  sweats, but he does say that he gained 20 pounds after stopping smoking.  HEENT:  No evidence of epistaxis, voice change, vertigo, photophobia, voice  or hearing loss. His dentition is good. INTEGUMENT:  The patient has no  nonhealing lesions or rashes. CARDIOPULMONARY:  The patient has left  scapular pain this morning accompanied by numbness in the left arm. Numbness  in the left arm was very transient, lasted only one to two minutes. He is  not complaining of shortness of breath. He does not have orthopnea or  paroxysmal nocturnal dyspnea. He does not experience edema or palpitations.  He has had no history of syncope or presyncope. He does not manifest  claudication or coughing and wheezing. UROGENITAL:  The patient has nocturia  about twice a night. The patient complains of numbness in the left arm about  two minutes today this morning. The patient does not have any degenerative  joint disease, joint swelling, deformities, or pain. The patient does have  GERD symptoms which manifested acid reflux which sometimes awakes him at  night with bitter taste in the back of his throat and a choking sensation.  This problem has existed since his surgery in February 2004. ENDOCRINE:  The  patient has no heat or cold intolerance. No skin changes or hair loss.  PHYSICAL EXAMINATION:  VITAL SIGNS:  Temperature 97.5, pulse  72 and regular,  respirations 20, blood pressure 122/79, oxygen saturation 94% on room air.  GENERAL:  The patient is alert and oriented x3 in no acute distress at  present after having received nitroglycerin and morphine in the emergency  room.  HEENT:  Eyes:  Pupils are equal, round, and reactive to light. Extraocular  movements are intact. The patient does not wear dentures. Mucous membranes  are pink and moist without lesions or erythema.  NECK:  Supple. There are no carotid bruits auscultated. No cervical  lymphadenopathy.  HEART:  Regular rate and rhythm without murmur. It is slow and regular.  LUNGS:  Clear to auscultation and percussion bilaterally. There are no  rashes or lesions noted on the skin. He does have a well healed midline  sternotomy. He also has right forearm radial artery harvest cicatrix and  saphenous vein graft harvest sites, and the legs are both well healed.  ABDOMEN:  Mildly obese and distended but nontender. Bowel sounds are  present. There is no hepatosplenomegaly.  UROGENITAL/RECTAL:  Deferred.  EXTREMITIES:  Show no evidence of clubbing, cyanosis, edema, or lesions.  There is no joint deformity or effusions. No costovertebral angle  tenderness. He cannot reproduce his left scapular pain by abducting his left  upper extremity.  NEUROLOGICAL:  No deficits noted. He has palpable dorsalis pedis pulses  bilaterally, palpable radial artery, 4/4 pulses bilaterally.   STUDIES:  Chest x-ray shows no evidence of pulmonary edema or interstitial  infiltrates. The heart size is only mildly enlarged. Electrocardiogram:  Rate of 64, sinus rhythm, no ST-T changes, no hypertrophy.   LABORATORY DATA:  Hematocrit is 43%, hemoglobin 14.6. Serum electrolytes:  Sodium 137, potassium 4.3, chloride 107, bicarbonate 24.7, BUN 21,  creatinine 1.2, glucose 95. Serial cardiac enzymes:  Troponin I studies are  less than 0.05 x2.   IMPRESSION:  1. Admitted with left scapular pain  and transient left arm numbness,     symptoms consistent with those he experienced during inferior myocardial     infarction February 2004.  2. Troponin I studies x2 are negative. Electrocardiogram nondiagnostic.  3. History of severe three-vessel coronary artery disease with subsequent     coronary artery bypass graft surgery February 2004.  4. Strong family history of premature coronary artery disease.  5. History of tobacco habituation, having quit February 2004.  6. Dyslipidemia.   PLAN:  The plan is to admit the patient, cycle his cardiac enzymes q.8h. x3,  start the patient on IV heparin and nitro paste with morphine as backup  should he have breakthrough pain, set up for left heart catheterization,  start the patient on Crestor 10 mg daily. The patient will be given Zocor 20  mg daily here and started on Crestor at discharge.   This examination, assessment, and plan has been formulated after discussion with Dr. Valera Castle and reflects Dr. Vern Claude thinking about the course of  care for this patient at [facility] on this admission.      Maple Mirza, P.A.                    Rollene Rotunda, M.D.    GM/MEDQ  D:  11/09/2003  T:  11/11/2003  Job:  [id number]

## 2011-01-11 NOTE — Discharge Summary (Signed)
NAME:  Randall Hall, Randall Hall NO.:  [id number]   MEDICAL RECORD NO.:  [id number]          PATIENT TYPE:  INP   LOCATION:  2031                         FACILITY:  [facility]   PHYSICIAN:  Bettey Mare. [place], NPDATE OF BIRTH:  Aug 28, 1955   DATE OF ADMISSION:  09/20/2006  DATE OF DISCHARGE:  09/23/2006                               DISCHARGE SUMMARY   PRIMARY CARDIOLOGIST:  Dr. Lewayne Bunting.   PRIMARY CARE PHYSICIAN:  Dr. Paulita Cradle.   PROCEDURE PERFORMED DURING HOSPITALIZATION:  Cardiac catheterization  completed by Dr. Angelina Sheriff on September 22, 2006.  A.  Severe 3-vessel native coronary artery disease LAD mid subtotal  stenosis, 70% stenosis after the second diagonal.  The distal LAD  wrapped the apex, it was seem to fill via the LIMA.  It did back fill  the moderate size second diagonal across with 70% lesion.  There was a  first diagonal which was large, occluded, at the ostium and then filled  via vein graft.  Circumflex had proximal 70% stenosis.  There was 80% to  90% stenosis.  There was a ramus intermediate which was a moderate size  and had a mid 30% stenosis.  A mid obtuse marginal was large and normal.  It was seen predominantly to fill via the vein graft.  The right  coronary artery was a dominant vessel.  It was occluded in the mid  segment.  There was mild long 25% stenosis.  The distal LAD was seen to  fill via a sequential vein graft.  B.  LIMA to LAD widely patent.  SVG subsequently to the PDA and  posterior lateral with widely patent with proximal 25% stenosis.  Saphenous vein graft to diagonal had proximal luminal irregularities but  was otherwise widely patent.  A free RIMA to an obtuse marginal was  normal.  Left ventriculogram revealed EF of 60% with normal wall motion.  Patient will be treated medically.   PRIMARY DIAGNOSES:  1. Known unstable angina with known coronary artery disease and patent      graft.  2. Status post cardiac  catheterization on September 22, 2006, as      described above.   SECONDARY DIAGNOSES:  1. Dyslipidemia.  2. Ongoing tobacco abuse.   HISTORY OF PRESENT ILLNESS:  A 55 year old Caucasian male with history  of coronary artery disease and coronary bypass grafting was admitted to  [facility] after driving here from [place] with complaints of  discomfort in his chest.  He had a sudden onset of sharp upper chest  pain radiating to his back.  The pain turned dull and also had element  of tightness.  There was radiation to the left arm, left neck and the  patient subsequently came to [facility] Emergency Room as a result of  these symptoms.  He was given nitroglycerin with subsequent relief.   HOSPITAL COURSE:  The patient was seen and examined by Dr. Lewayne Bunting  during hospitalization along with Theodore Demark, PA.  The patient was  admitted to rule out MI and cardiac enzymes were cycled.  They were  found to be negative throughout hospitalization.  The patient continued  on nitroglycerin and Heparin until cardiac catheterization on September 22, 2006.  Please see cardiac catheterization transcriptions for  thorough details.  The patient was treated medically for his angina and  will continue on medical treatment as his vein grafts are patent.   The patient also was given smoking cessation instruction and education  during hospitalization.  He was started on Chantix 0.5 mg b.i.d. and  will continue this as an outpatient.  The patient had no subsequent  chest discomfort during hospitalization although he did not smoke during  hospitalization as well.   On day of discharge, the patient was anxious to go home.  There was no  evidence of hematoma, bleeding or infection at the cardiac  catheterization site.  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   LABORATORY DATA:  Labs on discharge:   Hemoglobin 14.9, hematocrit 43.1,  whit blood cells 11.1, platelet 233.  Sodium 139, potassium 4.5,  chloride 105, CO2 27, glucose 107, BUN 13, creatinine 1.2, troponin was  negative at 0.05.  CK 89, CK-MB 1.2, cholesterol 243, triglycerides 338,  HDL 29, LDL 146, TSH 1.336.   VITAL SIGNS ON DISCHARGE:  Blood pressure 104/57.  Pulse 77.  Respirations 20.  Temperature 97.1.  O2 sat 94% on room air.   FOLLOWUP APPOINTMENTS AND PLANS:  1. The patient will follow up with Dr. Andee Lineman on October 20, 2006, at      3 p.m. for post discharge appointment.  2. The patient will start on Chantix 1 mg twice a day after loading      dose of 0.5 mg twice a day x3 days.  3. The patient has bee instructed not to return to work which requires      heavy lifting until Saturday September 27, 2006.  At that date, the      patient can continue to work but is not allowed to lift over 20      pounds during his work day.  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   DISCHARGE MEDICATIONS:  1. Carvedilol 3.125 mg b.i.d.  2. Aspirin 325 once a day.  3. Chantix 1 mg twice  a day after 0.5 mg twice a day x3 days.  4. Imdur 15 mg p.o. daily has also been instituted.   ALLERGIES:  NO KNOWN DRUG ALLERGIES.   Time spent with the patient includes physician time of 30 minutes.      Bettey Mare. Lyman Bishop, NP     KML/MEDQ  D:  09/23/2006  T:  09/24/2006  Job:  [id number]   cc:   Paulita Cradle, M.D.

## 2011-01-11 NOTE — Op Note (Signed)
NAME:  Randall Hall, Randall Hall NO.:  [id number]   MEDICAL RECORD NO.:  [id number]                   PATIENT TYPE:  INP   LOCATION:  2306                                 FACILITY:  [facility]   PHYSICIAN:  Evelene Croon, M.D.                  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   OPERATION:  Median sternotomy, extracorporeal circulation, coronary artery  bypass graft surgery times five using a left internal mammary artery graft  to the left anterior descending coronary artery, right radial artery graft  to the obtuse marginal branch of the left circumflex coronary artery, a  saphenous vein graft to the diagonal branch of the left anterior descending  artery and a sequential saphenous vein graft to the posterior descending and  posterior lateral branch of the right coronary artery.  Endoscopic vein  harvesting from the left leg.   SURGEON:  Evelene Croon, M.D.   ASSISTANT:  Maple Mirza, P.A.   ANESTHESIA:  General endotracheal   CLINICAL HISTORY:  This patient is a 55 year old white male smoker with  multiple cardiac risk factors who presented with unstable anginal symptoms.  His cardiac catheterization showed severe three vessel disease.  The LAD had  90% stenosis.  There was a small diagonal that had about 90% stenosis.  The  left circumflex gave off a single large marginal branch that had 80%  stenosis.  The right coronary artery was occluded with faint filling of the  posterior descending and posterior lateral branch by collaterals.  Left  ventricular function was well preserved.  After review of the angiogram and  examination of the patient, it was felt that coronary artery  bypass graft  surgery was the best treatment.  Due to his young age, I felt that a radial  artery graft would be beneficial particularly to his obtuse marginal branch  which was a large vessel supplying the lateral wall.  His preoperative upper  extremity arterial Dopplers showed that there was no decrease in the right  palmar arch Doppler flow with radial artery compression.  He was left handed  and therefore I decided to use his right radial artery.  I discussed using  his right radial artery with him, the benefits and risks including numbness  or paresthesias in the hand.  He understood and agreed to proceed.  I also  discussed the operative procedure of coronary bypass surgery in general  including alternatives, benefits, and risks including bleeding, blood  transfusion, infection, stroke, myocardial infarction, and death.  He and  his family understood this and agreed to proceed.   DESCRIPTION OF PROCEDURE:  The  patient was taken to the operating room and  placed on the table in the supine position.  After induction of general  endotracheal anesthesia, a Foley catheter was placed in the bladder using  sterile technique.  Then the patient was positioned supine with the right  arm extended.  The right arm was prepped with Betadine soap and solution and  draped in the usual sterile manner.  The right radial artery was exposed  just above the wrist.  An atraumatic vascular clamp was placed across the  pedicle. There was a good Doppler signal distal to the clamp.  Therefore, I  felt that it was save to use the radial artery.  Then the right radial  artery was harvested as a free graft.  All branches were ligated with clips.  The artery was suture ligated proximally and distally with 2-0 silk suture.  There was complete hemostasis.  The forearm was then closed in layers.  The  skin was closed with a 4-0 Vicryl subcuticular closure.  The sponge, needle  and instrument counts were correct  according to the scrub nurse at this  point.  A dry sterile dressing was applied over the incision and the arm was  wrapped with an Ace wrap.  Then the right arm was repositioned at the  patient's side.  The patient's neck, chest, abdomen and lower extremities  were then prepped with Betadine soap and solution and draped in the usual  sterile manner.   The chest was entered through a median sternotomy incision.  The pericardium  was opened in the midline.  Examination of the heart showed good ventricular  contractility.  The ascending aorta had no palpable plaques in it.   The left internal mammary artery was harvested from the chest wall as a  pedicle graft.  This was a medium caliber vessel with excellent blood flow  through it.  At the same time, a segment of greater saphenous vein was  harvested from the left leg using endoscope vein harvest technique.  This  vein was of medium size and of good quality.  We did initially try to find a  vein medial to the right knee but the only vein that we could find was a  small very superficial vein which I did not feel was the feel was the real  saphenous vein and no vein was harvested from this leg.   Then the patient was heparinized and when an adequate activated clotting  time as achieved, the distal ascending aorta was cannulated using a 20  French aortic cannula for arterial end flow.  Venous out flow was achieved  using a two stage venous cannula through the right atrial appendage.  An  antegrade cardioplegia and vent cannula was inserted in the aortic root.   The patient was placed on cardiopulmonary bypass and the distal coronary  artery was identified.  The LAD was a large graftable vessel.  The diagonal  branch was small but graftable.  The obtuse marginal was a large graftable  vessel.  It was heavily diseased proximally.  The right coronary artery gave off a small posterior descending and two small posterior lateral branches.  These  appear to be smaller than they were on the angiogram.  Nevertheless  they were graftable.   Then the aorta was gross clamped and 500 cc of cold blood antegrade  cardioplegia was administered in the aortic root with quick arrest of the  heart.  Systemic hypothermia to 20 degrees Centigrade  and topical  hypothermia with iced saline was used.  A temperature probe was placed in  the septum and installating pad in the pericardium.   First distal anastomosis was performed to the posterior descending coronary  artery.  The internal diameter was about 1.5 mm.  The conduit used was a  segment of greater saphenous vein and the anastomosis performed in a  sequential side-to-side manner using continuous 7-0 Prolene suture.  Flow  was measured through the graft and was excellent.   A second distal anastomosis was performed at the posterior lateral branch.  The internal diameter was 1.5 mm.  The conduit used was a segment of greater  saphenous vein.  The anastomosed was performed in a sequential end-to-side  manner using continuous 7-0 Prolene suture.  Flow was measured through the  graft and was excellent.  Then a dose of cardioplegia was given down this  vein graft and in the aortic root.   A third distal anastomosis was performed at the obtuse marginal branch.  The  internal diameter was about 2 mm.  The conduit used was the right radial  artery graft and this was anastomosed in a end-to-side manner using  continuous 8-0 Prolene suture.  Flow was measured through the graft and was  excellent.   The fourth distal anastomosis was performed to the diagonal branch.  The  internal diameter was 1.6 mm.  The conduit used was a second segment of  greater saphenous vein and the anastomosis performed in an end-to-side  manner using continuous 7-0 Prolene suture.  Flow was measured through the  graft and was excellent.  Then a dose of Cardioplegia was given down the  vein graft and in the aortic root.    The fifth distal anastomosis was performed at the mid portion of the left  anterior descending coronary artery.  The internal diameter was about 2.5  mm.  The conduit used was a left internal mammary graft and this was brought  through an opening in the left pericardium and anterior to the phrenic  nerve.  It was anastomosed to the LAD an end-to-side manner using continuous  8-0 Prolene suture.  The pedicle was tacked to the epicardium with 6-0  Prolene sutures.  The patient was rewarmed to 37 degrees Centigrade and the  clamp removed from the mammary artery pedicle.  There was rapid warming of  the ventricle septum and returned of spontaneous ventricular fibrillation.  The cross clamp was removed with a time of 69 minutes and the patient  defibrillated into a sinus rhythm.   Partial occlusion clamp was placed in the aortic root and the two proximal vein graft anastomosis were performed at the aortic root in an end-to-side  using continuous 6-0 Prolene suture.  The proximal anastomosis of the radial  artery graft was performed directly to the aortic root in an end-to-side  using continuous 7-0 Prolene suture.  The clamp was removed.  The vein  grafts were deaired and the clamps removed from them. The proximal and  distal anastomosis appeared hemostatic and allowed to graft satisfactory.  Graft markers were placed around the proximal anastomosis.  Two temporary  right ventricular and right atrial pacing wires were placed and brought out  through the skin.   When the patient had rewarmed to 37 degrees Centigrade, he was weaned from  cardiopulmonary bypass on inotropic agents.  Total bypass time was 119  minutes.  Cardiac function appeared excellent with a cardiac output of 5  liters per minute.  Protamine was given and the venous and aortic cannulas  were removed without difficulty.  Hemostasis was achieved.  Three chest  tubes were placed with two in the posterior pericardium, one in the  left  pleural space and one in the anterior mediastinum.  The pericardium was  reapproximated over the heart.  Sternum was closed with #6 stainless steel  wires.  Fascia was closed with continuous #1 Vicryl suture.  Subcutaneous  tissue was closed with continuous 2-0 Vicryl and the skin with 3-0 Vicryl  subcuticular closure.  Lower extremity vein harvest site was closed in  layers in a similar manner.  The sponge, needle and instrument counts were  correct according to the scrub nurse.  Dry sterile dressings were applied  over the incisions, around the chest tubes which were hooked to Pleurovac  suction.  The patient remained hemodynamically stable and was transported to  the SICU in guarded but stable condition.                                               Evelene Croon, M.D.    BB/MEDQ  D:  10/14/2002  T:  10/14/2002  Job:  [id number]   cc:   [facility] Office   [place] [facility]   Cardiac Catheterization Lab, [facility]

## 2011-04-04 ENCOUNTER — Encounter: Payer: Self-pay | Admitting: Cardiology

## 2012-07-01 ENCOUNTER — Ambulatory Visit: Payer: Self-pay | Admitting: Cardiology

## 2012-10-28 ENCOUNTER — Ambulatory Visit (INDEPENDENT_AMBULATORY_CARE_PROVIDER_SITE_OTHER): Payer: Self-pay | Admitting: Cardiology

## 2012-10-28 DIAGNOSIS — R0989 Other specified symptoms and signs involving the circulatory and respiratory systems: Secondary | ICD-10-CM

## 2013-08-12 ENCOUNTER — Encounter (INDEPENDENT_AMBULATORY_CARE_PROVIDER_SITE_OTHER): Payer: Self-pay

## 2013-08-12 ENCOUNTER — Ambulatory Visit: Payer: Self-pay | Admitting: Nurse Practitioner

## 2013-08-12 ENCOUNTER — Telehealth: Payer: Self-pay | Admitting: Family Medicine

## 2013-08-12 VITALS — BP 118/78 | HR 68 | Temp 98.0°F | Ht 66.0 in | Wt 244.0 lb

## 2013-08-12 DIAGNOSIS — H663X2 Other chronic suppurative otitis media, left ear: Secondary | ICD-10-CM

## 2013-08-12 DIAGNOSIS — H663X9 Other chronic suppurative otitis media, unspecified ear: Secondary | ICD-10-CM

## 2013-08-12 MED ORDER — FLUTICASONE PROPIONATE 50 MCG/ACT NA SUSP: 2.0000 | Freq: Every day | NASAL | Status: DC

## 2013-08-12 NOTE — Telephone Encounter (Signed)
Ear pain for a few days. Has been out of town. Appt scheduled for this afternoon. Patient aware.

## 2013-08-12 NOTE — Patient Instructions (Signed)
Serous Otitis Media  Serous otitis media is fluid in the middle ear space. This space contains the bones for hearing and air. Air in the middle ear space helps to transmit sound.  The air gets there through the eustachian tube. This tube goes from the back of the nose (nasopharynx) to the middle ear space. It keeps the pressure in the middle ear the same as the outside world. It also helps to drain fluid from the middle ear space. CAUSES  Serous otitis media occurs when the eustachian tube gets blocked. Blockage can come from:  Ear infections.  Colds and other upper respiratory infections.  Allergies.  Irritants such as cigarette smoke.  Sudden changes in air pressure (such as descending in an airplane).  Enlarged adenoids.  A mass in the nasopharynx. During colds and upper respiratory infections, the middle ear space can become temporarily filled with fluid. This can happen after an ear infection also. Once the infection clears, the fluid will generally drain out of the ear through the eustachian tube. If it does not, then serous otitis media occurs. SIGNS AND SYMPTOMS   Hearing loss.  A feeling of fullness in the ear, without pain.  Young children may not show any symptoms but may show slight behavioral changes, such as agitation, ear pulling, or crying. DIAGNOSIS  Serous otitis media is diagnosed by an ear exam. Tests may be done to check on the movement of the eardrum. Hearing exams may also be done. TREATMENT  The fluid most often goes away without treatment. If allergy is the cause, allergy treatment may be helpful. Fluid that persists for several months may require minor surgery. A small tube is placed in the eardrum to:  Drain the fluid.  Restore the air in the middle ear space. In certain situations, antibiotics are used to avoid surgery. Surgery may be done to remove enlarged adenoids (if this is the cause). HOME CARE INSTRUCTIONS   Keep children away from tobacco  smoke.  Be sure to keep any follow-up appointments. SEEK MEDICAL CARE IF:   Your hearing is not better in 3 months.  Your hearing is worse.  You have ear pain.  You have drainage from the ear.  You have dizziness.  You have serous otitis media only in one ear or have any bleeding from your nose (epistaxis).  You notice a lump on your neck. MAKE SURE YOU:  Understand these instructions.   Will watch your condition.   Will get help right away if you are not doing well or get worse.  Document Released: 11/02/2003 Document Revised: 04/14/2013 Document Reviewed: 03/09/2013 [employer] Patient Information 2014 [employer].  

## 2013-08-12 NOTE — Progress Notes (Signed)
   Subjective:    Patient ID: Randall Hall, male    DOB: 02-23-1956, 57 y.o.   MRN: [id number]  HPI Patient in today c/o left ear pain- started over a week ago- has gotten worse.    Review of Systems  Constitutional: Negative for fever, chills and fatigue.  HENT: Positive for ear pain. Negative for postnasal drip, rhinorrhea and sinus pressure.   Respiratory: Positive for cough.        Objective:   Physical Exam  Constitutional: He appears well-developed and well-nourished.  HENT:  Right Ear: Hearing, tympanic membrane, external ear and ear canal normal.  Left Ear: Hearing and external ear normal. A middle ear effusion is present.  Nose: Mucosal edema and rhinorrhea present. Right sinus exhibits no maxillary sinus tenderness and no frontal sinus tenderness. Left sinus exhibits no maxillary sinus tenderness and no frontal sinus tenderness.  Mouth/Throat: Oropharynx is clear and moist and mucous membranes are normal.  Cardiovascular: Normal rate and normal heart sounds.   Pulmonary/Chest: Effort normal and breath sounds normal.  Skin: Skin is warm and dry.    BP 118/78  Pulse 68  Temp(Src) 98 F (36.7 C) (Oral)  Ht 5\' 6"  (1.676 m)  Wt 244 lb (110.678 kg)  BMI 39.40 kg/m2       Assessment & Plan:   1. Chronic suppurative OM, left    Meds ordered this encounter  Medications  . fluticasone (FLONASE) 50 MCG/ACT nasal spray    Sig: Place 2 sprays into both nostrils daily.    Dispense:  16 g    Refill:  6    Order Specific Question:  Supervising Provider    Answer:  Ernestina Penna [[id number]]   Force fluids Rest OTC decongestant RTO prn Mary-Margaret Daphine Deutscher, FNP

## 2014-09-30 ENCOUNTER — Encounter (INDEPENDENT_AMBULATORY_CARE_PROVIDER_SITE_OTHER): Payer: Self-pay

## 2014-09-30 ENCOUNTER — Encounter: Payer: Self-pay | Admitting: Family Medicine

## 2014-09-30 ENCOUNTER — Ambulatory Visit (INDEPENDENT_AMBULATORY_CARE_PROVIDER_SITE_OTHER): Payer: Self-pay | Admitting: Family Medicine

## 2014-09-30 VITALS — BP 112/70 | HR 88 | Temp 97.4°F | Ht 67.0 in | Wt 256.4 lb

## 2014-09-30 DIAGNOSIS — I25760 Atherosclerosis of bypass graft of coronary artery of transplanted heart with unstable angina: Secondary | ICD-10-CM

## 2014-09-30 DIAGNOSIS — Z0000 Encounter for general adult medical examination without abnormal findings: Secondary | ICD-10-CM

## 2014-09-30 DIAGNOSIS — Z024 Encounter for examination for driving license: Secondary | ICD-10-CM

## 2014-09-30 LAB — POCT URINALYSIS DIPSTICK
Bilirubin, UA: NEGATIVE
Blood, UA: NEGATIVE
Glucose, UA: NEGATIVE
Ketones, UA: NEGATIVE
Leukocytes, UA: NEGATIVE
Nitrite, UA: NEGATIVE
Protein, UA: NEGATIVE
Spec Grav, UA: 1.01
Urobilinogen, UA: NEGATIVE
pH, UA: 7

## 2014-09-30 NOTE — Addendum Note (Signed)
Addended by: Fawn KirkHOLT, CATHY on: 09/30/2014 11:49 AM   Modules accepted: Orders, Medications

## 2014-09-30 NOTE — Progress Notes (Signed)
   Subjective:    Patient ID: [name], male    DOB: 19-Feb-1956, 59 y.o.   MRN: [id number]  HPI Patient is here for DOT physical.  He had CABG 2004 and has not had stress test since.  Review of Systems  Constitutional: Negative for fever.  HENT: Negative for ear pain.   Eyes: Negative for discharge.  Respiratory: Negative for cough.   Cardiovascular: Negative for chest pain.  Gastrointestinal: Negative for abdominal distention.  Endocrine: Negative for polyuria.  Genitourinary: Negative for difficulty urinating.  Musculoskeletal: Negative for gait problem and neck pain.  Skin: Negative for color change and rash.  Neurological: Negative for speech difficulty and headaches.  Psychiatric/Behavioral: Negative for agitation.       Objective:    There were no vitals taken for this visit. Physical Exam  Constitutional: He is oriented to person, place, and time. He appears well-developed and well-nourished.  HENT:  Head: Normocephalic and atraumatic.  Mouth/Throat: Oropharynx is clear and moist.  Eyes: Pupils are equal, round, and reactive to light.  Neck: Normal range of motion. Neck supple.  Cardiovascular: Normal rate and regular rhythm.   No murmur heard. Pulmonary/Chest: Effort normal and breath sounds normal.  Abdominal: Soft. Bowel sounds are normal. There is no tenderness.  Neurological: He is alert and oriented to person, place, and time.  Skin: Skin is warm and dry.  Psychiatric: He has a normal mood and affect.          Assessment & Plan:     ICD-9-CM ICD-10-CM   1. Routine general medical examination at a health care facility V70.0 Z00.00 POCT urinalysis dipstick   Temporary for 3 months until gets Stress test from cardiologist  No Follow-up on file.  Deatra CanterWilliam J Oxford FNP

## 2014-10-04 ENCOUNTER — Other Ambulatory Visit: Payer: Self-pay | Admitting: Family Medicine

## 2014-10-04 DIAGNOSIS — I25700 Atherosclerosis of coronary artery bypass graft(s), unspecified, with unstable angina pectoris: Secondary | ICD-10-CM

## 2014-10-05 ENCOUNTER — Ambulatory Visit: Payer: Self-pay | Admitting: Cardiology

## 2014-10-05 NOTE — Addendum Note (Signed)
Addended by: Bernadene BellWYATT, Alvin Rubano M on: 10/05/2014 04:51 PM   Modules accepted: Orders

## 2014-10-25 ENCOUNTER — Telehealth: Payer: Self-pay | Admitting: *Deleted

## 2014-10-25 NOTE — Telephone Encounter (Signed)
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  Test scheduled for 12/01/14 at 3:30 pm at [facility] [address] [place] [phone].  Pt notified of appointment info.

## 2014-12-01 ENCOUNTER — Ambulatory Visit (HOSPITAL_COMMUNITY)
Admission: RE | Admit: 2014-12-01 | Discharge: 2014-12-01 | Disposition: A | Discharge status: Home / Self Care | Payer: 59 | Source: Ambulatory Visit | Attending: Cardiology | Admitting: Cardiology

## 2014-12-01 DIAGNOSIS — I25760 Atherosclerosis of bypass graft of coronary artery of transplanted heart with unstable angina: Secondary | ICD-10-CM | POA: Diagnosis not present

## 2014-12-01 NOTE — Procedures (Signed)
Exercise Treadmill Tes   Test  Exercise Tolerance Test Ordering MD: Angelina SheriffJake Yasin Ducat, MD    Unique Test No: 1  Treadmill:  1  Indication for ETT: DOT Physical, CAD, CABG  Contraindication to ETT: No   Stress Modality: exercise - treadmill  Cardiac Imaging Performed: non   Protocol: standard Bruce - maximal  Max BP:  163/94  Max MPHR (bpm):  161 85% MPR (bpm):  137  MPHR obtained (bpm):  153 % MPHR obtained:  93  Reached 85% MPHR (min:sec):  6 Total Exercise Time (min-sec):  7  Workload in METS:  8.7 Borg Scale: 16  Reason ETT Terminated:  SOB and General Fatigue    ST Segment Analysis At Rest: normal ST segments - no evidence of significant ST depression With Exercise: no evidence of significant ST depression  Other Information Arrhythmia:  No Angina during ETT:  absent (0) Quality of ETT:  diagnostic  ETT Interpretation:  normal - no evidence of ischemia by ST analysis  Comments: The patient had an good exercise tolerance.  There was no chest pain.  There was an appropriate level of dyspnea.  There were no arrhythmias, a normal heart rate response and normal BP response.  There were no ischemic ST T wave changes and a normal heart rate recovery.   Recommendations: Negative adequate POET (Plain Old Exercise Treadmill).

## 2014-12-02 ENCOUNTER — Encounter: Payer: Self-pay | Admitting: Cardiology

## 2014-12-02 ENCOUNTER — Ambulatory Visit (INDEPENDENT_AMBULATORY_CARE_PROVIDER_SITE_OTHER): Payer: 59 | Admitting: Cardiology

## 2014-12-02 VITALS — BP 142/88 | HR 71 | Ht 66.0 in | Wt 260.0 lb

## 2014-12-02 DIAGNOSIS — I2510 Atherosclerotic heart disease of native coronary artery without angina pectoris: Secondary | ICD-10-CM | POA: Diagnosis not present

## 2014-12-02 NOTE — Patient Instructions (Signed)
Your physician wants you to follow-up in: 1 year with Dr Antoine PocheHochrein. You will receive a reminder letter in the mail two months in advance. If you don't receive a letter, please call our office to schedule the follow-up appointment.  A FASTING lipid profile: to be done at your convenience.  [facility] is [facility] lab on the first floor of this building, [address].  They are open from 8am-5pm with a lunch from 12-2.  You do not need an appointment.

## 2014-12-02 NOTE — Progress Notes (Signed)
Cardiology Office Note   Date:  12/02/2014   ID:  Randall Hall, DOB 1956/06/25, MRN [id number]  PCP:  Deatra Canterxford, William J, FNP  Cardiologist:   Rollene RotundaJames Arine Foley, MD   Chief Complaint  Patient presents with  . Coronary Artery Disease      History of Present Illness: Randall Randall Hall is a 59 y.o. male who presents for follow-up of coronary disease. It's been almost 7 years since I saw him. He had bypass in 2004. He was referred back for exercise treadmill testing because he needed just for the DOT.  He took this test yesterday and actually did well with this. There was no evidence of ischemia.  He exercised for about 6 minutes. He achieved his target heart rate. He says he is active. He walks up a hill every day. 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 The patient denies any new symptoms such as chest discomfort, neck or arm discomfort. There has been no new shortness of breath, PND or orthopnea. There have been no reported palpitations, presyncope or syncope.  He denies any of the pain between the shoulder blades that was his previous angina.   Past Medical History  Diagnosis Date  . History of myocardial infarction   . Hyperlipemia   . Tobacco abuse     Past Surgical History  Procedure Laterality Date  . Coronary artery bypass graft      x5     Current Outpatient Prescriptions  Medication Sig Dispense Refill  . aspirin 81 MG tablet Take 81 mg by mouth daily.      Marland Kitchen. b complex vitamins tablet Take 1 tablet by mouth.      Alphonsus Sias. Bee Pollen 580 MG CAPS Take by mouth.      . fish oil-omega-3 fatty acids 1000 MG capsule Take 1 capsule by mouth.      . fluticasone (FLONASE) 50 MCG/ACT nasal spray Place 2 sprays into both nostrils daily. 16 g 6  . Garlic Oil 3 MG CAPS Take by mouth.      . NON FORMULARY Prostate pill (Prostaga)- supplement    . Red Yeast Rice Extract 600 MG CAPS Take by mouth.      . sildenafil (VIAGRA) 50 MG tablet Take 50 mg by mouth as  needed.       No current facility-administered medications for this visit.    Allergies:   Review of patient's allergies indicates no known allergies.    Social History:  The patient  reports that he has been smoking Cigarettes.  He has a 10.5 pack-year smoking history. He does not have any smokeless tobacco history on file. He reports that he drinks alcohol. He reports that he does not use illicit drugs.   Family History:  The patient's family history includes CAD in his brother; CAD (age of onset: 6051) in his father; Diabetes in his mother.    ROS:  Please see the history of present illness.   Otherwise, review of systems are positive for none.   All other systems are reviewed and negative.    PHYSICAL EXAM: VS:  BP 142/88 mmHg  Pulse 71  Ht 5\' 6"  (1.676 m)  Wt 260 lb (117.935 kg)  BMI 41.99 kg/m2 , BMI Body mass index is 41.99 kg/(m^2). GENERAL:  Well appearing HEENT:  Pupils equal round and reactive, fundi not visualized, oral mucosa unremarkable NECK:  No jugular venous distention, waveform within normal limits, carotid  upstroke brisk and symmetric, no bruits, no thyromegaly LYMPHATICS:  No cervical, inguinal adenopathy LUNGS:  Clear to auscultation bilaterally BACK:  No CVA tenderness CHEST:  Unremarkable HEART:  PMI not displaced or sustained,S1 and S2 within normal limits, no S3, no S4, no clicks, no rubs, no murmurs ABD:  Flat, positive bowel sounds normal in frequency in pitch, no bruits, no rebound, no guarding, no midline pulsatile mass, no hepatomegaly, no splenomegaly, obese EXT:  2 plus pulses throughout, no edema, no cyanosis no clubbing SKIN:  No rashes no nodules NEURO:  Cranial nerves II through XII grossly intact, motor grossly intact throughout PSYCH:  Cognitively intact, oriented to person place and time    EKG:  EKG is ordered today. The ekg ordered today demonstrates sinus rhythm, rate 71, axis within normal limits, intervals within normal limits, no  acute ST-T wave changes.   Recent Labs: No results found for requested labs within last 365 days.       Wt Readings from Last 3 Encounters:  12/02/14 260 lb (117.935 kg)  09/30/14 256 lb 6.4 oz (116.302 kg)  08/12/13 244 lb (110.678 kg)      Other studies Reviewed: Additional studies/ records that were reviewed today include:  Old operative report. Review of the above records demonstrates:  Please see elsewhere in the note.     ASSESSMENT AND PLAN:  CAD:  The patient has had no new symptoms. He had a negative POET (Plain Old Exercise Treadmill).    no further cardiovascular testing is planned that he needs aggressive risk reduction.  He is cleared for the DOT.   DYSLIPIDEMIA:  I will check a lipid profile.  TOBACCO:  We discussed strategies to stop smoking completely.  OBESITY:  The patient understands the need to lose weight with diet and exercise. We have discussed specific strategies for this.  Current medicines are reviewed at length with the patient today.  The patient does not have concerns regarding medicines.  The following changes have been made:  no change  Labs/ tests ordered today include: Lipid profile  No orders of the defined types were placed in this encounter.     Disposition:   FU with me in one year.    Signed, Rollene Rotunda, MD  12/02/2014 12:55 PM    [facility]

## 2014-12-05 ENCOUNTER — Encounter: Payer: Self-pay | Admitting: Nurse Practitioner

## 2014-12-06 ENCOUNTER — Encounter: Payer: Self-pay | Admitting: Family Medicine

## 2014-12-07 ENCOUNTER — Ambulatory Visit: Payer: Self-pay | Admitting: Cardiology

## 2014-12-26 ENCOUNTER — Encounter: Payer: Self-pay | Admitting: Nurse Practitioner

## 2015-09-07 ENCOUNTER — Ambulatory Visit: Payer: Self-pay | Admitting: Nurse Practitioner

## 2015-09-07 ENCOUNTER — Encounter: Payer: Self-pay | Admitting: Nurse Practitioner

## 2015-09-07 VITALS — BP 137/86 | HR 75 | Temp 98.6°F | Ht 66.0 in | Wt 260.0 lb

## 2015-09-07 DIAGNOSIS — Z024 Encounter for examination for driving license: Secondary | ICD-10-CM

## 2015-09-07 LAB — POCT URINALYSIS DIPSTICK
Bilirubin, UA: NEGATIVE
GLUCOSE UA: NEGATIVE
Ketones, UA: NEGATIVE
NITRITE UA: NEGATIVE
PH UA: 5
SPEC GRAV UA: 1.02
UROBILINOGEN UA: NEGATIVE

## 2015-09-07 LAB — POCT UA - MICROSCOPIC ONLY
Casts, Ur, LPF, POC: NEGATIVE
Crystals, Ur, HPF, POC: NEGATIVE
Mucus, UA: NEGATIVE
YEAST UA: NEGATIVE

## 2015-09-07 NOTE — Progress Notes (Signed)
  Private DOT- see scanned in results  Results for orders placed or performed in visit on 09/07/15  POCT urinalysis dipstick  Result Value Ref Range   Color, UA gold    Clarity, UA clear    Glucose, UA neg    Bilirubin, UA neg    Ketones, UA neg    Spec Grav, UA 1.020    Blood, UA large    pH, UA 5.0    Protein, UA trace    Urobilinogen, UA negative    Nitrite, UA neg    Leukocytes, UA large (3+) (A) Negative

## 2015-11-26 NOTE — Progress Notes (Signed)
Same day cancellation

## 2015-11-27 ENCOUNTER — Encounter: Payer: Self-pay | Admitting: Cardiology

## 2016-01-18 ENCOUNTER — Encounter: Payer: Self-pay | Admitting: Cardiology

## 2016-01-24 ENCOUNTER — Ambulatory Visit: Payer: Self-pay | Admitting: Cardiology

## 2016-02-20 NOTE — Progress Notes (Signed)
No show  This encounter was created in error - please disregard.

## 2016-02-21 ENCOUNTER — Encounter: Payer: Self-pay | Admitting: Cardiology

## 2016-04-29 NOTE — Progress Notes (Deleted)
Cardiology Office Note   Date:  04/29/2016   ID:  [name], DOB 10-20-1955, MRN [id number]  PCP:  Bennie PieriniMary-Margaret Martin, FNP  Cardiologist:   Rollene RotundaJames Matix Henshaw, MD   No chief complaint on file.     History of Present Illness: [name] is a 60 y.o. male who presents for follow-up of coronary disease. Marland Kitchen. He had bypass in 2004. 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 There was no evidence of ischemia.  ***    He exercised for about 6 minutes. He achieved his target heart rate. He says he is active. He walks up a hill every day. 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 The patient denies any new symptoms such as chest discomfort, neck or arm discomfort. There has been no new shortness of breath, PND or orthopnea. There have been no reported palpitations, presyncope or syncope.  He denies any of the pain between the shoulder blades that was his previous angina.   Past Medical History:  Diagnosis Date  . History of myocardial infarction   . Hyperlipemia   . Tobacco abuse     Past Surgical History:  Procedure Laterality Date  . CORONARY ARTERY BYPASS GRAFT     2004 Dr. Laneta SimmersBartle LIMA to the LAD, SVG to PDA and posterior lateral, SVG to diagonal, RIMA to obtuse marginal.  Last catheterization 2008.     Current Outpatient Prescriptions  Medication Sig Dispense Refill  . aspirin 81 MG tablet Take 81 mg by mouth daily.      Marland Kitchen. b complex vitamins tablet Take 1 tablet by mouth.      Alphonsus Sias. Bee Pollen 580 MG CAPS Take by mouth.      . fish oil-omega-3 fatty acids 1000 MG capsule Take 1 capsule by mouth.      . fluticasone (FLONASE) 50 MCG/ACT nasal spray Place 2 sprays into both nostrils daily. 16 g 6  . Garlic Oil 3 MG CAPS Take by mouth.      . NON FORMULARY Prostate pill (Prostaga)- supplement    . Red Yeast Rice Extract 600 MG CAPS Take by mouth.      . sildenafil (VIAGRA) 50 MG tablet Take 50 mg by mouth as needed.       No current  facility-administered medications for this visit.     Allergies:   Review of patient's allergies indicates no known allergies.    ROS:  Please see the history of present illness.   Otherwise, review of systems are positive for ***.   All other systems are reviewed and negative.    PHYSICAL EXAM: VS:  There were no vitals taken for this visit. , BMI There is no height or weight on file to calculate BMI. GENERAL:  Well appearing HEENT:  Pupils equal round and reactive, fundi not visualized, oral mucosa unremarkable NECK:  No jugular venous distention, waveform within normal limits, carotid upstroke brisk and symmetric, no bruits, no thyromegaly LUNGS:  Clear to auscultation bilaterally BACK:  No CVA tenderness CHEST:  Unremarkable HEART:  PMI not displaced or sustained,S1 and S2 within normal limits, no S3, no S4, no clicks, no rubs, no murmurs ABD:  Flat, positive bowel sounds normal in frequency in pitch, no bruits, no rebound, no guarding, no midline pulsatile mass, no hepatomegaly, no splenomegaly, obese EXT:  2 plus pulses throughout, no edema, no cyanosis no clubbing    EKG:  EKG is ***  ordered today. The ekg ordered today demonstrates sinus rhythm, rate 71, axis within normal limits, intervals within normal limits, no acute ST-T wave changes.   Recent Labs: No results found for requested labs within last 8760 hours.       Wt Readings from Last 3 Encounters:  09/07/15 260 lb (117.9 kg)  12/02/14 260 lb (117.9 kg)  09/30/14 256 lb 6.4 oz (116.3 kg)      Other studies Reviewed: Additional studies/ records that were reviewed today include: *** Review of the above records demonstrates:  ***   ASSESSMENT AND PLAN:  CAD:  The patient has had no new symptoms. He had a negative POET (Plain Old Exercise Treadmill) last year.  ***  DYSLIPIDEMIA: ***  TOBACCO:  We discussed strategies to stop smoking completely.  OBESITY:  The patient understands the need to lose weight  with diet and exercise. We have discussed specific strategies for this.  Current medicines are reviewed at length with the patient today.  The patient does not have concerns regarding medicines.  The following changes have been made: ***  Labs/ tests ordered today include:*** No orders of the defined types were placed in this encounter.    Disposition:   FU with me in ***year.    Signed, Rollene Rotunda, MD  04/29/2016 10:41 PM    [facility]

## 2016-05-01 ENCOUNTER — Ambulatory Visit: Payer: Self-pay | Admitting: Cardiology

## 2016-06-19 ENCOUNTER — Ambulatory Visit: Payer: Self-pay | Admitting: Cardiology

## 2016-07-14 ENCOUNTER — Emergency Department (HOSPITAL_COMMUNITY): Payer: BLUE CROSS/BLUE SHIELD

## 2016-07-14 ENCOUNTER — Encounter (HOSPITAL_COMMUNITY): Payer: Self-pay | Admitting: Emergency Medicine

## 2016-07-14 ENCOUNTER — Inpatient Hospital Stay (HOSPITAL_COMMUNITY)
Admission: EM | Admit: 2016-07-14 | Discharge: 2016-07-17 | DRG: 287 | Disposition: A | Discharge status: Home / Self Care | Payer: BLUE CROSS/BLUE SHIELD | Attending: Cardiology | Admitting: Cardiology

## 2016-07-14 DIAGNOSIS — R079 Chest pain, unspecified: Secondary | ICD-10-CM | POA: Diagnosis not present

## 2016-07-14 DIAGNOSIS — Z7982 Long term (current) use of aspirin: Secondary | ICD-10-CM

## 2016-07-14 DIAGNOSIS — I25110 Atherosclerotic heart disease of native coronary artery with unstable angina pectoris: Principal | ICD-10-CM | POA: Diagnosis present

## 2016-07-14 DIAGNOSIS — Z79899 Other long term (current) drug therapy: Secondary | ICD-10-CM | POA: Diagnosis not present

## 2016-07-14 DIAGNOSIS — N179 Acute kidney failure, unspecified: Secondary | ICD-10-CM | POA: Diagnosis present

## 2016-07-14 DIAGNOSIS — E785 Hyperlipidemia, unspecified: Secondary | ICD-10-CM | POA: Diagnosis present

## 2016-07-14 DIAGNOSIS — I10 Essential (primary) hypertension: Secondary | ICD-10-CM | POA: Diagnosis present

## 2016-07-14 DIAGNOSIS — Z720 Tobacco use: Secondary | ICD-10-CM | POA: Diagnosis present

## 2016-07-14 DIAGNOSIS — I25700 Atherosclerosis of coronary artery bypass graft(s), unspecified, with unstable angina pectoris: Secondary | ICD-10-CM | POA: Diagnosis not present

## 2016-07-14 DIAGNOSIS — E1169 Type 2 diabetes mellitus with other specified complication: Secondary | ICD-10-CM | POA: Diagnosis present

## 2016-07-14 DIAGNOSIS — F17210 Nicotine dependence, cigarettes, uncomplicated: Secondary | ICD-10-CM | POA: Diagnosis not present

## 2016-07-14 DIAGNOSIS — Z8249 Family history of ischemic heart disease and other diseases of the circulatory system: Secondary | ICD-10-CM

## 2016-07-14 DIAGNOSIS — E860 Dehydration: Secondary | ICD-10-CM | POA: Diagnosis not present

## 2016-07-14 DIAGNOSIS — I200 Unstable angina: Secondary | ICD-10-CM | POA: Diagnosis present

## 2016-07-14 DIAGNOSIS — R42 Dizziness and giddiness: Secondary | ICD-10-CM | POA: Diagnosis not present

## 2016-07-14 DIAGNOSIS — R030 Elevated blood-pressure reading, without diagnosis of hypertension: Secondary | ICD-10-CM | POA: Diagnosis not present

## 2016-07-14 DIAGNOSIS — I252 Old myocardial infarction: Secondary | ICD-10-CM

## 2016-07-14 DIAGNOSIS — I2510 Atherosclerotic heart disease of native coronary artery without angina pectoris: Secondary | ICD-10-CM | POA: Diagnosis present

## 2016-07-14 DIAGNOSIS — Z955 Presence of coronary angioplasty implant and graft: Secondary | ICD-10-CM

## 2016-07-14 DIAGNOSIS — Z951 Presence of aortocoronary bypass graft: Secondary | ICD-10-CM

## 2016-07-14 DIAGNOSIS — R0789 Other chest pain: Secondary | ICD-10-CM | POA: Diagnosis not present

## 2016-07-14 DIAGNOSIS — I25710 Atherosclerosis of autologous vein coronary artery bypass graft(s) with unstable angina pectoris: Secondary | ICD-10-CM | POA: Diagnosis not present

## 2016-07-14 HISTORY — DX: Acute kidney failure, unspecified: N17.9

## 2016-07-14 HISTORY — DX: Atherosclerotic heart disease of native coronary artery without angina pectoris: I25.10

## 2016-07-14 LAB — BASIC METABOLIC PANEL
Anion gap: 11 (ref 5–15)
BUN: 18 mg/dL (ref 6–20)
CHLORIDE: 104 mmol/L (ref 101–111)
CO2: 25 mmol/L (ref 22–32)
CREATININE: 1.34 mg/dL — AB (ref 0.61–1.24)
Calcium: 10.2 mg/dL (ref 8.9–10.3)
GFR calc Af Amer: >60 mL/min (ref >60)
GFR calc non Af Amer: 56 mL/min — ABNORMAL LOW (ref >60)
Glucose, Bld: 106 mg/dL — ABNORMAL HIGH (ref 65–99)
POTASSIUM: 4.2 mmol/L (ref 3.5–5.1)
Sodium: 140 mmol/L (ref 135–145)

## 2016-07-14 LAB — CBC
HEMATOCRIT: 48.7 % (ref 39.0–52.0)
Hemoglobin: 17.2 g/dL — ABNORMAL HIGH (ref 13.0–17.0)
MCH: 33 pg (ref 26.0–34.0)
MCHC: 35.3 g/dL (ref 30.0–36.0)
MCV: 93.3 fL (ref 78.0–100.0)
PLATELETS: 224 10*3/uL (ref 150–400)
RBC: 5.22 MIL/uL (ref 4.22–5.81)
RDW: 13.2 % (ref 11.5–15.5)
WBC: 14.7 10*3/uL — ABNORMAL HIGH (ref 4.0–10.5)

## 2016-07-14 LAB — I-STAT TROPONIN, ED: Troponin i, poc: 0.01 ng/mL (ref 0.00–0.08)

## 2016-07-14 LAB — D-DIMER, QUANTITATIVE: D-Dimer, Quant: 0.47 ug/mL-FEU (ref 0.00–0.50)

## 2016-07-14 LAB — TROPONIN I: Troponin I: <0.03 ng/mL (ref <0.03)

## 2016-07-14 MED ORDER — DM-GUAIFENESIN ER 30-600 MG PO TB12
1.0000 | ORAL_TABLET | Freq: Two times a day (BID) | ORAL | Status: DC | PRN
  Filled 2016-07-14: qty 1

## 2016-07-14 MED ORDER — CARVEDILOL 3.125 MG PO TABS
3.1250 mg | ORAL_TABLET | Freq: Two times a day (BID) | ORAL | Status: DC
  Administered 2016-07-14 – 2016-07-17 (×5): 3.125 mg via ORAL
  Filled 2016-07-14 (×5): qty 1

## 2016-07-14 MED ORDER — ALPRAZOLAM 0.25 MG PO TABS: 0.2500 mg | ORAL_TABLET | Freq: Two times a day (BID) | ORAL | Status: DC | PRN

## 2016-07-14 MED ORDER — VITAMIN E 15 UNIT/0.3ML PO SOLN
100.0000 [IU] | Freq: Every day | ORAL | Status: DC
  Administered 2016-07-15: 100 [IU] via ORAL
  Filled 2016-07-14 (×3): qty 2

## 2016-07-14 MED ORDER — ONDANSETRON HCL 4 MG/2ML IJ SOLN: 4.0000 mg | Freq: Four times a day (QID) | INTRAMUSCULAR | Status: DC | PRN

## 2016-07-14 MED ORDER — ASPIRIN 325 MG PO TABS
325.0000 mg | ORAL_TABLET | Freq: Every day | ORAL | Status: DC
  Administered 2016-07-15: 325 mg via ORAL
  Filled 2016-07-14: qty 1

## 2016-07-14 MED ORDER — GARLIC 100 MG PO TABS: 1.0000 | ORAL_TABLET | Freq: Every day | ORAL | Status: DC

## 2016-07-14 MED ORDER — ATORVASTATIN CALCIUM 40 MG PO TABS
40.0000 mg | ORAL_TABLET | Freq: Every day | ORAL | Status: DC
  Administered 2016-07-14 – 2016-07-16 (×3): 40 mg via ORAL
  Filled 2016-07-14 (×3): qty 1

## 2016-07-14 MED ORDER — OMEGA-3-ACID ETHYL ESTERS 1 G PO CAPS
1.0000 | ORAL_CAPSULE | Freq: Every day | ORAL | Status: DC
  Administered 2016-07-15 – 2016-07-16 (×2): 1 g via ORAL
  Filled 2016-07-14 (×3): qty 1

## 2016-07-14 MED ORDER — NITROGLYCERIN 0.4 MG SL SUBL: 0.4000 mg | SUBLINGUAL_TABLET | SUBLINGUAL | Status: DC | PRN

## 2016-07-14 MED ORDER — ACETAMINOPHEN 325 MG PO TABS: 650.0000 mg | ORAL_TABLET | ORAL | Status: DC | PRN

## 2016-07-14 MED ORDER — ALBUTEROL SULFATE (2.5 MG/3ML) 0.083% IN NEBU: 2.5000 mg | INHALATION_SOLUTION | Freq: Four times a day (QID) | RESPIRATORY_TRACT | Status: DC | PRN

## 2016-07-14 MED ORDER — B COMPLEX-C PO TABS
1.0000 | ORAL_TABLET | Freq: Every day | ORAL | Status: DC
  Administered 2016-07-15: 1 via ORAL
  Filled 2016-07-14 (×3): qty 1

## 2016-07-14 MED ORDER — ZOLPIDEM TARTRATE 5 MG PO TABS
5.0000 mg | ORAL_TABLET | Freq: Every evening | ORAL | Status: DC | PRN
  Administered 2016-07-15: 5 mg via ORAL
  Filled 2016-07-14: qty 1

## 2016-07-14 MED ORDER — NICOTINE 21 MG/24HR TD PT24
21.0000 mg | MEDICATED_PATCH | Freq: Every day | TRANSDERMAL | Status: DC
  Administered 2016-07-15 – 2016-07-17 (×3): 21 mg via TRANSDERMAL
  Filled 2016-07-14 (×4): qty 1

## 2016-07-14 MED ORDER — SODIUM CHLORIDE 0.9 % IV SOLN
INTRAVENOUS | Status: DC
  Administered 2016-07-14: 21:00:00 via INTRAVENOUS

## 2016-07-14 MED ORDER — ENOXAPARIN SODIUM 40 MG/0.4ML ~~LOC~~ SOLN
40.0000 mg | SUBCUTANEOUS | Status: DC
  Administered 2016-07-14 – 2016-07-15 (×2): 40 mg via SUBCUTANEOUS
  Filled 2016-07-14 (×2): qty 0.4

## 2016-07-14 MED ORDER — MORPHINE SULFATE (PF) 4 MG/ML IV SOLN: 2.0000 mg | INTRAVENOUS | Status: DC | PRN

## 2016-07-14 NOTE — ED Notes (Signed)
RN attempted IV x 2 unsuccessful. Documented. Informed staff RN of need for IV when patient is transported to his inpatient room.

## 2016-07-14 NOTE — ED Provider Notes (Signed)
[facility]-EMERGENCY DEPT Provider Note   CSN: [id number] Arrival date & time: 07/14/16  1634     History   Chief Complaint Chief Complaint  Patient presents with  . Chest Pain    HPI Randall Hall is a 60 y.o. male.  Randall Hall is a 60 y.o. male with h/o MI s/p CABG in 2004, HLD, tobacco use presents to ED with complaint of chest pain. Patient was watching tv when onset of centralized chest pressure this morning with left arm pain and numbness, dizziness, generalized fatigue, and shaking. Pt states dizziness and generalized fatigue are similar sxs to when he had his MI. Pain is not made worse with deep inspiration. No aggravating factors. He took 4 baby ASA with resolution of sxs. While in ED he has had intermittent chest pressure lasting 1-2 minutes. Wife reports pt has complained of intermittent chest pressure for the last 2 weeks, typically while driving. 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 Denies fever, URI sxs, changes in vision, cough, hemoptysis, lower leg swelling/pain, h/o blood clot, h/o cancer, abdominal pain, N/V, dysuria, hematuria, rash, facial droop, slurred speech, or LOC. Patient does state his pressure typically runs low; however, in ED elevated at 182/87.       Past Medical History:  Diagnosis Date  . CAD (coronary artery disease)   . History of myocardial infarction   . Hyperlipemia   . Tobacco abuse     Patient Active Problem List   Diagnosis Date Noted  . Chest pain 07/14/2016  . AKI (acute kidney injury) (HCC) 07/14/2016  . Elevated blood pressure reading 07/14/2016  . CAD (coronary artery disease)   . Tobacco abuse   . Hyperlipemia   . MYOCARDIAL INFARCTION, HX OF 09/10/2010  . CHEST PAIN UNSPECIFIED 09/10/2010  . STRAIN, CHEST WALL 09/10/2010    Past Surgical History:  Procedure Laterality Date  . CORONARY ARTERY BYPASS GRAFT     2004 Dr. Laneta Simmers LIMA to the LAD, SVG to PDA and posterior lateral, SVG to diagonal, RIMA to obtuse marginal.  Last  catheterization 2008.       Home Medications    Prior to Admission medications   Medication Sig Start Date End Date Taking? Authorizing Provider  aspirin 81 MG tablet Take 81 mg by mouth daily.      Historical Provider, MD  b complex vitamins tablet Take 1 tablet by mouth.      Historical Provider, MD  Bee Pollen 580 MG CAPS Take by mouth.      Historical Provider, MD  fish oil-omega-3 fatty acids 1000 MG capsule Take 1 capsule by mouth.      Historical Provider, MD  fluticasone (FLONASE) 50 MCG/ACT nasal spray Place 2 sprays into both nostrils daily. 08/12/13   Mary-Margaret Daphine Deutscher, FNP  Garlic Oil 3 MG CAPS Take by mouth.      Historical Provider, MD  NON FORMULARY Prostate pill Nonah Mattes)- supplement    Historical Provider, MD  Red Yeast Rice Extract 600 MG CAPS Take by mouth.      Historical Provider, MD  sildenafil (VIAGRA) 50 MG tablet Take 50 mg by mouth as needed.      Historical Provider, MD    Family History Family History  Problem Relation Age of Onset  . Diabetes Mother   . CAD Father 58    Died  . CAD Brother     Social History Social History  Substance Use Topics  . Smoking status: Current Every Day Smoker  Packs/day: 0.50    Years: 21.00    Types: Cigarettes  . Smokeless tobacco: Never Used  . Alcohol use 0.0 oz/week     Allergies   Patient has no known allergies.   Review of Systems Review of Systems  Constitutional: Positive for fatigue. Negative for fever.  HENT: Negative for trouble swallowing.   Eyes: Negative for visual disturbance.  Respiratory: Negative for cough and shortness of breath.   Cardiovascular: Positive for chest pain. Negative for leg swelling.  Gastrointestinal: Negative for abdominal pain, nausea and vomiting.  Genitourinary: Negative for dysuria and hematuria.  Musculoskeletal: Negative for arthralgias and myalgias.  Skin: Negative for rash.  Neurological: Positive for dizziness and numbness. Negative for syncope, facial  asymmetry, speech difficulty and headaches.     Physical Exam Updated Vital Signs BP 182/87 (BP Location: Left Arm)   Pulse 76   Temp 98.3 F (36.8 C) (Oral)   Resp 14   Ht 5' 6.5" (1.689 m)   Wt 117 kg   SpO2 98%   BMI 41.02 kg/m   Physical Exam  Constitutional: He appears well-developed and well-nourished. No distress.  HENT:  Head: Normocephalic and atraumatic.  Mouth/Throat: Oropharynx is clear and moist. No oropharyngeal exudate.  Eyes: Conjunctivae and EOM are normal. Pupils are equal, round, and reactive to light. Right eye exhibits no discharge. Left eye exhibits no discharge. No scleral icterus.  Neck: Normal range of motion and phonation normal. Neck supple. No neck rigidity. Normal range of motion present.  Cardiovascular: Normal rate, regular rhythm, normal heart sounds and intact distal pulses.   No murmur heard. Pulmonary/Chest: Effort normal and breath sounds normal. No stridor. No respiratory distress. He has no wheezes. He has no rales. He exhibits no tenderness.  Abdominal: Soft. Bowel sounds are normal. He exhibits no distension. There is no tenderness. There is no rigidity, no rebound, no guarding and no CVA tenderness.  Musculoskeletal: Normal range of motion.  No lower extremity swelling or pain. No TTP of posterior calf. Negative homan's sign.   Lymphadenopathy:    He has no cervical adenopathy.  Neurological: He is alert. He is not disoriented. Coordination and gait normal. GCS eye subscore is 4. GCS verbal subscore is 5. GCS motor subscore is 6.  Skin: Skin is warm and dry. He is not diaphoretic.  Psychiatric: He has a normal mood and affect. His behavior is normal.     ED Treatments / Results  Labs (all labs ordered are listed, but only abnormal results are displayed) Labs Reviewed  BASIC METABOLIC PANEL - Abnormal; Notable for the following:       Result Value   Glucose, Bld 106 (*)    Creatinine, Ser 1.34 (*)    GFR calc non Af Amer 56 (*)     All other components within normal limits  CBC - Abnormal; Notable for the following:    WBC 14.7 (*)    Hemoglobin 17.2 (*)    All other components within normal limits  D-DIMER, QUANTITATIVE (NOT AT [facility])  I-STAT TROPOININ, ED    EKG  EKG Interpretation  Date/Time:  Sunday July 14 2016 16:41:35 EST Ventricular Rate:  70 PR Interval:  146 QRS Duration: 84 QT Interval:  376 QTC Calculation: 406 R Axis:   68 Text Interpretation:  Normal sinus rhythm Normal ECG since last tracing no significant change Confirmed by BELFI  MD, MELANIE ([id number]) on 07/14/2016 5:32:48 PM       Radiology Dg Chest 2 View  Result Date: 07/14/2016 CLINICAL DATA:  Chest pressure, generalized weakness and dizziness since 10 a.m. this morning. EXAM: CHEST  2 VIEW COMPARISON:  Single-view of the chest 09/20/2006 and 09/10/2010. FINDINGS: The patient is status post CABG. There is bronchitic change. No consolidative process, pneumothorax or effusion. No evidence of edema. Remote healed left clavicle fracture is noted. IMPRESSION: Chronic bronchitic change without acute disease. Electronically Signed   By: Drusilla Kannerhomas  Dalessio M.D.   On: 07/14/2016 17:29    Procedures Procedures (including critical care time)  Medications Ordered in ED Medications - No data to display   Initial Impression / Assessment and Plan / ED Course  I have reviewed the triage vital signs and the nursing notes.  Pertinent labs & imaging results that were available during my care of the patient were reviewed by me and considered in my medical decision making (see chart for details).  Clinical Course as of Jul 14 1921  Wynelle LinkSun Jul 14, 2016  1800 No focal consolidation, effusion, or PTX DG Chest 2 View [AM]    Clinical Course User Index [AM] Lona KettleAshley Laurel Dallan Schonberg, [place]PA-C    Patient presents to ED with chest pain. Patient is afebrile and non-toxic appearing in NAD. Vital signs remarkable for elevated BP, otherwise stable. Heart RRR. Lungs  CTABL. Obese abdomen, soft, non-tender.  Mild AKI noted. Elevated WBC - no cough, afebrile, no dyuria, no abdominal pain, doubt infectious - ?reactive. Initial troponin negative. CXR shows no acute changes. EKG shows sinus rhythm. Heart score 4. 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 Given cardiac hx will consult cardiology. Likely to need admission for ACS r/o.   7:04 PM: Spoke with Dr. Mendel Corninguner of Cardiology, recommends hospital admission for ACS r/o, notify cards if trop +. Thank you for your time and input. D-dimer nml. Consult to hospitalist placed.   7:20 PM: [facility] greatly appreciate his time, agree to admit patient for ACS r/o. Thank you for your continued care of this patient.   Final Clinical Impressions(s) / ED Diagnoses   Final diagnoses:  Tobacco abuse  Hyperlipidemia, unspecified hyperlipidemia type  Chest pain, unspecified type    New Prescriptions New Prescriptions   No medications on file     Lona Kettleshley Laurel Laresa Oshiro, PA-C 07/14/16 1922    Rolan BuccoMelanie Belfi, MD 07/14/16 2056

## 2016-07-14 NOTE — ED Notes (Signed)
Attempted to give reportx1 

## 2016-07-14 NOTE — ED Triage Notes (Signed)
C/o pressure to center of chest with dizziness and generalized weakness since 10am.  Denies sob, nausea, and vomiting.

## 2016-07-14 NOTE — H&P (Addendum)
History and Physical    Randall HearingJohn Hall ZOX:[id number] DOB: 05/16/1956 DOA: 07/14/2016  Referring MD/NP/PA:   PCP: Randall PieriniMary-Margaret Martin, FNP   Patient coming from:  The patient is coming from home.  At baseline, pt is independent for most of ADL.  Chief Complaint: chest pain  HPI: Randall HearingJohn Hall is a 60 y.o. male with medical history significant of CAD, MI, s/p of CABG 2004, tobacco abuse, hyperlipidemia, who presents with chest pain.  Patient states that his chest pain started at about 10 AM. It is located in substernal area, intermittent, 5 out of 10 in severity, pressure-like, radiating to the left arm. It is not pleuritic. It is associated with generalized weakness and dizziness. He has mild dry cough, but no shortness of breath, fever or chills. It happens every 15-20 minutes, lasts for about 1-2 minutes, resolves spontaneously. He took 4 pill of 81 mg of ASA with some relief.  currently he is chest pain-free. Patient denies nausea, vomiting, diarrhea, abdominal pain, symptoms of UTI or unilateral weakness. No rashes. Patient is a truck driver, and denies tenderness over calf areas.  ED Course: pt was found to have negative d-dimer, negative troponin, WBC 14.7,  AKI with Cre 1.34, temperature normal, blood pressure elevated at arrival 182/87 -->132/75, chest x-ray showed chronic bronchitis change without infiltration. Patient is placed on telemetry bed for observation.   Review of Systems:   General: no fevers, chills, no changes in body weight, has fatigue and weakness HEENT: no blurry vision, Hall changes or sore throat Respiratory: has dry cough, no dyspnea, wheezing CV: has chest pain, no palpitations GI: no nausea, vomiting, abdominal pain, diarrhea, constipation GU: no dysuria, burning on urination, increased urinary frequency, hematuria  Ext: no leg edema Neuro: no unilateral weakness, numbness, or tingling, no vision change or Hall loss. has dizziness. Skin: no rash, no  skin tear. MSK: No muscle spasm, no deformity, no limitation of range of movement in spin Heme: No easy bruising.  Travel history: No recent long distant travel.  Allergy: No Known Allergies  Past Medical History:  Diagnosis Date  . CAD (coronary artery disease)   . History of myocardial infarction   . Hyperlipemia   . Tobacco abuse     Past Surgical History:  Procedure Laterality Date  . CORONARY ARTERY BYPASS GRAFT     2004 Dr. Laneta SimmersBartle LIMA to the LAD, SVG to PDA and posterior lateral, SVG to diagonal, RIMA to obtuse marginal.  Last catheterization 2008.    Social History:  reports that he has been smoking Cigarettes.  He has a 10.50 pack-year smoking history. He has never used smokeless tobacco. He reports that he drinks alcohol. He reports that he uses drugs, including Marijuana.  Family History:  Family History  Problem Relation Age of Onset  . Diabetes Mother   . CAD Father 3651    Died  . CAD Brother      Prior to Admission medications   Medication Sig Start Date End Date Taking? Authorizing Provider  aspirin 81 MG tablet Take 81 mg by mouth daily.      Historical Provider, MD  b complex vitamins tablet Take 1 tablet by mouth.      Historical Provider, MD  Bee Pollen 580 MG CAPS Take by mouth.      Historical Provider, MD  fish oil-omega-3 fatty acids 1000 MG capsule Take 1 capsule by mouth.      Historical Provider, MD  fluticasone (FLONASE) 50 MCG/ACT nasal spray Place 2  sprays into both nostrils daily. 08/12/13   Randall Daphine Deutscher, FNP  Garlic Oil 3 MG CAPS Take by mouth.      Historical Provider, MD  NON FORMULARY Prostate pill Nonah Mattes)- supplement    Historical Provider, MD  Red Yeast Rice Extract 600 MG CAPS Take by mouth.      Historical Provider, MD  sildenafil (VIAGRA) 50 MG tablet Take 50 mg by mouth as needed.      Historical Provider, MD    Physical Exam: Vitals:   07/14/16 1649 07/14/16 1900 07/14/16 1915 07/14/16 1930  BP:  131/83 132/75 139/83    Pulse:  67 65 67  Resp:  21 14 15   Temp:      TempSrc:      SpO2:  93% 93% 93%  Weight: 117 kg (258 lb)     Height: 5' 6.5" (1.689 m)      General: Not in acute distress HEENT:       Eyes: PERRL, EOMI, no scleral icterus.       ENT: No discharge from the ears and nose, no pharynx injection, no tonsillar enlargement.        Neck: No JVD, no bruit, no mass felt. Heme: No neck lymph node enlargement. Cardiac: S1/S2, RRR, No murmurs, No gallops or rubs. Respiratory: No rales, wheezing, rhonchi or rubs. GI: Soft, nondistended, nontender, no rebound pain, no organomegaly, BS present. GU: No hematuria Ext: No pitting leg edema bilaterally. 2+DP/PT pulse bilaterally. Musculoskeletal: No joint deformities, No joint redness or warmth, no limitation of ROM in spin. Skin: No rashes.  Neuro: Alert, oriented X3, cranial nerves II-XII grossly intact, moves all extremities normally. Psych: Patient is not psychotic, no suicidal or hemocidal ideation.  Labs on Admission: I have personally reviewed following labs and imaging studies  CBC:  Recent Labs Lab 07/14/16 1654  WBC 14.7*  HGB 17.2*  HCT 48.7  MCV 93.3  PLT 224   Basic Metabolic Panel:  Recent Labs Lab 07/14/16 1654  NA 140  K 4.2  CL 104  CO2 25  GLUCOSE 106*  BUN 18  CREATININE 1.34*  CALCIUM 10.2   GFR: Estimated Creatinine Clearance: 70.3 mL/min (by C-G formula based on SCr of 1.34 mg/dL (H)). Liver Function Tests: No results for input(s): AST, ALT, ALKPHOS, BILITOT, PROT, ALBUMIN in the last 168 hours. No results for input(s): LIPASE, AMYLASE in the last 168 hours. No results for input(s): AMMONIA in the last 168 hours. Coagulation Profile: No results for input(s): INR, PROTIME in the last 168 hours. Cardiac Enzymes: No results for input(s): CKTOTAL, CKMB, CKMBINDEX, TROPONINI in the last 168 hours. BNP (last 3 results) No results for input(s): PROBNP in the last 8760 hours. HbA1C: No results for input(s):  HGBA1C in the last 72 hours. CBG: No results for input(s): GLUCAP in the last 168 hours. Lipid Profile: No results for input(s): CHOL, HDL, LDLCALC, TRIG, CHOLHDL, LDLDIRECT in the last 72 hours. Thyroid Function Tests: No results for input(s): TSH, T4TOTAL, FREET4, T3FREE, THYROIDAB in the last 72 hours. Anemia Panel: No results for input(s): VITAMINB12, FOLATE, FERRITIN, TIBC, IRON, RETICCTPCT in the last 72 hours. Urine analysis:    Component Value Date/Time   BILIRUBINUR neg 09/07/2015 0954   PROTEINUR trace 09/07/2015 0954   UROBILINOGEN negative 09/07/2015 0954   NITRITE neg 09/07/2015 0954   LEUKOCYTESUR large (3+) (A) 09/07/2015 0954   Sepsis Labs: @LABRCNTIP (procalcitonin:4,lacticidven:4) )No results found for this or any previous visit (from the past 240 hour(s)).  Radiological Exams on Admission: Dg Chest 2 View  Result Date: 07/14/2016 CLINICAL DATA:  Chest pressure, generalized weakness and dizziness since 10 a.m. this morning. EXAM: CHEST  2 VIEW COMPARISON:  Single-view of the chest 09/20/2006 and 09/10/2010. FINDINGS: The patient is status post CABG. There is bronchitic change. No consolidative process, pneumothorax or effusion. No evidence of edema. Remote healed left clavicle fracture is noted. IMPRESSION: Chronic bronchitic change without acute disease. Electronically Signed   By: Drusilla Kannerhomas  Dalessio M.D.   On: 07/14/2016 17:29     EKG: Independently reviewed. Sinus rhythm, QTC 406, early R progression, Q wave in III.  ssessment/Plan Principal Problem:   Chest pain Active Problems:   CAD (coronary artery disease)   Tobacco abuse   AKI (acute kidney injury) (HCC)   Elevated blood pressure reading   Hyperlipidemia   Chest pain and hx of CAD: D-dimer is negative, less likely to have a PE, no infiltration on chest x-ray for pneumonia. Pt has significant risk factors including old age, hyperlipidemia, CAD, s/p of CABG, and smoking, will need to r/o ACS. EDP  spoke with Dr. Mendel Corninguner of Cardiology, recommends hospital admission for ACS r/o, notify cards if trop positive.   - will place on Tele bed for obs - cycle CE q6 x3 and repeat her EKG in the am  - prn Nitroglycerin, Morphine, and aspirin (pt took 324 mg ASA at home) - start lipitor 40 mg daily - start low dose of Coreg 3.125 mg bid - Risk factor stratification: will check FLP, UDS and A1C  - 2d echo - please call Card in AM  Tobacco abuse: -Did counseling about importance of quitting smoking -Nicotine patch  HLD: Last LDL was not our record. Patient is not taking medications at home. -Start Lipitor 40 mg daily -Check FLP  AKI: Likely due to prerenal secondary to dehydration - IVF: NS 100 cc/h - Check FeNa - Avoid ACEI and NSAIDs  Elevated blood pressure reading: no hx of hypertension, likely due to pain. -Started Coreg for chest pain which should help   DVT ppx: SQ Lovenox Code Status: Full code Family Communication: Yes, patient's wife and son at bed side Disposition Plan:  Anticipate discharge back to previous home environment Consults called: EDP spoke with Dr. Mendel Corninguner of Cardiology, recommends hospital admission for ACS r/o, notify cards if trop +.  Admission status: Obs / tele    Date of Service 07/14/2016    Lorretta HarpIU, Riyana Biel [facility] Pager [phone]  If 7PM-7AM, please contact night-coverage [email or web address] Password [email or web address] 07/14/2016, 7:49 PM

## 2016-07-14 NOTE — ED Notes (Signed)
Pt on way to XR 

## 2016-07-15 ENCOUNTER — Observation Stay (HOSPITAL_BASED_OUTPATIENT_CLINIC_OR_DEPARTMENT_OTHER): Payer: BLUE CROSS/BLUE SHIELD

## 2016-07-15 DIAGNOSIS — E785 Hyperlipidemia, unspecified: Secondary | ICD-10-CM | POA: Diagnosis not present

## 2016-07-15 DIAGNOSIS — R030 Elevated blood-pressure reading, without diagnosis of hypertension: Secondary | ICD-10-CM | POA: Diagnosis not present

## 2016-07-15 DIAGNOSIS — I200 Unstable angina: Secondary | ICD-10-CM

## 2016-07-15 DIAGNOSIS — R079 Chest pain, unspecified: Secondary | ICD-10-CM | POA: Diagnosis not present

## 2016-07-15 DIAGNOSIS — N179 Acute kidney failure, unspecified: Secondary | ICD-10-CM

## 2016-07-15 DIAGNOSIS — I25700 Atherosclerosis of coronary artery bypass graft(s), unspecified, with unstable angina pectoris: Secondary | ICD-10-CM | POA: Diagnosis not present

## 2016-07-15 LAB — TROPONIN I

## 2016-07-15 LAB — SODIUM, URINE, RANDOM: Sodium, Ur: 140 mmol/L

## 2016-07-15 LAB — ECHOCARDIOGRAM COMPLETE
EWDT: 127 ms
FS: 29 % (ref 28–44)
Height: 66.5 in
IVS/LV PW RATIO, ED: 0.86
LA ID, A-P, ES: 46 mm
LA diam end sys: 46 mm
LA diam index: 2.08 cm/m2
LA vol index: 19.1 mL/m2
LA vol: 42.3 mL
LAVOLA4C: 36.7 mL
LDCA: 3.14 cm2
LV e' LATERAL: 9.57 cm/s
LVOTD: 20 mm
MV Dec: 127
MVPKEVEL: 1.2 m/s
PW: 14 mm — AB (ref 0.6–1.1)
RV LATERAL S' VELOCITY: 11.6 cm/s
RV TAPSE: 16.5 mm
TDI e' lateral: 9.57
TDI e' medial: 8.49
Weight: 3998.4 oz

## 2016-07-15 LAB — LIPID PANEL
CHOLESTEROL: 237 mg/dL — AB (ref 0–200)
HDL: 30 mg/dL — ABNORMAL LOW (ref >40)
LDL Cholesterol: 138 mg/dL — ABNORMAL HIGH (ref 0–99)
TRIGLYCERIDES: 343 mg/dL — AB (ref <150)
Total CHOL/HDL Ratio: 7.9 RATIO
VLDL: 69 mg/dL — ABNORMAL HIGH (ref 0–40)

## 2016-07-15 LAB — RAPID URINE DRUG SCREEN, HOSP PERFORMED
Amphetamines: NOT DETECTED
BARBITURATES: NOT DETECTED
Benzodiazepines: NOT DETECTED
Cocaine: NOT DETECTED
Opiates: NOT DETECTED
TETRAHYDROCANNABINOL: NOT DETECTED

## 2016-07-15 LAB — CREATININE, URINE, RANDOM: CREATININE, URINE: 230.07 mg/dL

## 2016-07-15 MED ORDER — SODIUM CHLORIDE 0.9% FLUSH: 3.0000 mL | Freq: Two times a day (BID) | INTRAVENOUS | Status: DC

## 2016-07-15 MED ORDER — SODIUM CHLORIDE 0.9 % IV BOLUS (SEPSIS)
1000.0000 mL | Freq: Once | INTRAVENOUS | Status: AC
  Administered 2016-07-15: 1000 mL via INTRAVENOUS

## 2016-07-15 MED ORDER — SODIUM CHLORIDE 0.9 % WEIGHT BASED INFUSION
1.0000 mL/kg/h | INTRAVENOUS | Status: DC
Start: 2016-07-15 — End: 2016-07-16
  Administered 2016-07-15 – 2016-07-16 (×3): 1 mL/kg/h via INTRAVENOUS

## 2016-07-15 MED ORDER — ASPIRIN 81 MG PO CHEW
81.0000 mg | CHEWABLE_TABLET | Freq: Every day | ORAL | Status: DC
  Administered 2016-07-16 – 2016-07-17 (×2): 81 mg via ORAL
  Filled 2016-07-15 (×2): qty 1

## 2016-07-15 MED ORDER — SODIUM CHLORIDE 0.9% FLUSH: 3.0000 mL | INTRAVENOUS | Status: DC | PRN

## 2016-07-15 MED ORDER — SODIUM CHLORIDE 0.9 % IV SOLN: 250.0000 mL | INTRAVENOUS | Status: DC | PRN

## 2016-07-15 NOTE — Progress Notes (Signed)
2D echocardiogram has been performed. 

## 2016-07-15 NOTE — Progress Notes (Signed)
[facility] PROGRESS NOTE    Progress Note  Randall HearingJohn Bettes  [id number] DOB: May 24, 1956 DOA: 07/14/2016 PCP: Bennie PieriniMary-Margaret Martin, FNP     Brief Narrative:   Randall Hall is an 60 y.o. male significant past medical history of CAD, MI status post CABG in 2014 and hyperlipidemia who presented to the ED with chest pain that started on the morning of admission. Cardiac biomarkers have been negative 2, her triglycerides are 300, her HDL is less than 40 held her LDL is greater than 100  Assessment/Plan:   Chest pain: Chest pain happen all sitting down and lasted about 5 minutes, No events on telemetry, cardiac biomarkers have been negative 3, EKG shows sinus rhythm normal axis no specific 2 changes. D-dimer is negative. Continue Lipitor and Coreg. A1c is pending UDS is negative.  CAD (coronary artery disease) Cont Lipitor.  Tobacco abuse She was counseled about quitting smoking, continue nicotine patch.  AKI (acute kidney injury) (HCC) Unknown baseline, fractional secretion of sodium is less than 1. Likely prerenal in etiology.  Elevated blood pressure reading: Likely due to pain. Now improved. BP at goal.   DVT prophylaxis: lovenox Family Communication:none Disposition Plan/Barrier to D/C: home in am Code Status:     Code Status Orders        Start     Ordered   07/14/16 1941  Full code  Continuous     07/14/16 1941    Code Status History    Date Active Date Inactive Code Status Order ID Comments User Context   This patient has a current code status but no historical code status.        IV Access:    Peripheral IV   Procedures and diagnostic studies:   Dg Chest 2 View  Result Date: 07/14/2016 CLINICAL DATA:  Chest pressure, generalized weakness and dizziness since 10 a.m. this morning. EXAM: CHEST  2 VIEW COMPARISON:  Single-view of the chest 09/20/2006 and 09/10/2010. FINDINGS: The patient is status post CABG. There is bronchitic change. No  consolidative process, pneumothorax or effusion. No evidence of edema. Remote healed left clavicle fracture is noted. IMPRESSION: Chronic bronchitic change without acute disease. Electronically Signed   By: Drusilla Kannerhomas  Dalessio M.D.   On: 07/14/2016 17:29     Medical Consultants:    None.  Anti-Infectives:   None  Subjective:    Randall HearingJohn Wigger has chest pain and shortness of breath renal complaints.  Objective:    Vitals:   07/14/16 1930 07/14/16 2051 07/14/16 2100 07/15/16 0500  BP: 139/83  128/83 133/80  Pulse: 67   (!) 59  Resp: 15   20  Temp:  98.4 F (36.9 C)  98.1 F (36.7 C)  TempSrc:  Oral  Oral  SpO2: 93%   97%  Weight:    113.4 kg (249 lb 14.4 oz)  Height:        Intake/Output Summary (Last 24 hours) at 07/15/16 0904 Last data filed at 07/15/16 0840  Gross per 24 hour  Intake              644 ml  Output              400 ml  Net              244 ml   Filed Weights   07/14/16 1649 07/15/16 0500  Weight: 117 kg (258 lb) 113.4 kg (249 lb 14.4 oz)    Exam: General exam: In no acute distress. Respiratory system: Good  air movement and clear to auscultation. Cardiovascular system: S1 & S2 heard, RRR. No JVD. Gastrointestinal system: Abdomen is nondistended, soft and nontender.  Extremities: No pedal edema. Skin: No rashes, lesions or ulcers Psychiatry: Judgement and insight appear normal.   Data Reviewed:    Labs: Basic Metabolic Panel:  Recent Labs Lab 07/14/16 1654  NA 140  K 4.2  CL 104  CO2 25  GLUCOSE 106*  BUN 18  CREATININE 1.34*  CALCIUM 10.2   GFR Estimated Creatinine Clearance: 69.1 mL/min (by C-G formula based on SCr of 1.34 mg/dL (H)). Liver Function Tests: No results for input(s): AST, ALT, ALKPHOS, BILITOT, PROT, ALBUMIN in the last 168 hours. No results for input(s): LIPASE, AMYLASE in the last 168 hours. No results for input(s): AMMONIA in the last 168 hours. Coagulation profile No results for input(s): INR, PROTIME in the  last 168 hours.  CBC:  Recent Labs Lab 07/14/16 1654  WBC 14.7*  HGB 17.2*  HCT 48.7  MCV 93.3  PLT 224   Cardiac Enzymes:  Recent Labs Lab 07/14/16 2056 07/15/16 0136 07/15/16 0714  TROPONINI <0.03 <0.03 <0.03   BNP (last 3 results) No results for input(s): PROBNP in the last 8760 hours. CBG: No results for input(s): GLUCAP in the last 168 hours. D-Dimer:  Recent Labs  07/14/16 1654  DDIMER 0.47   Hgb A1c: No results for input(s): HGBA1C in the last 72 hours. Lipid Profile:  Recent Labs  07/15/16 0136  CHOL 237*  HDL 30*  LDLCALC 138*  TRIG 343*  CHOLHDL 7.9   Thyroid function studies: No results for input(s): TSH, T4TOTAL, T3FREE, THYROIDAB in the last 72 hours.  Invalid input(s): FREET3 Anemia work up: No results for input(s): VITAMINB12, FOLATE, FERRITIN, TIBC, IRON, RETICCTPCT in the last 72 hours. Sepsis Labs:  Recent Labs Lab 07/14/16 1654  WBC 14.7*   Microbiology No results found for this or any previous visit (from the past 240 hour(s)).   Medications:   . aspirin  325 mg Oral Daily  . atorvastatin  40 mg Oral q1800  . B-complex with vitamin C  1 tablet Oral Daily  . carvedilol  3.125 mg Oral BID WC  . enoxaparin (LOVENOX) injection  40 mg Subcutaneous Q24H  . nicotine  21 mg Transdermal Daily  . omega-3 acid ethyl esters  1 capsule Oral Daily  . vitamin e  100 Units Oral Daily   Continuous Infusions: . sodium chloride 100 mL/hr at 07/14/16 2116    Time spent: 25 min   LOS: 0 days   Marinda ElkFELIZ ORTIZ, Avni Traore  [facility] Pager [phone]  *Please refer to [email or web address], password [email or web address] to get updated schedule on who will round on this patient, as hospitalists switch teams weekly. If 7PM-7AM, please contact night-coverage at [email or web address], password [email or web address] for any overnight needs.  07/15/2016, 9:04 AM

## 2016-07-15 NOTE — Consult Note (Signed)
CARDIOLOGY CONSULT NOTE   Patient ID: Randall Hall MRN: [id number] DOB/AGE: Mar 25, 1956 60 y.o.  Admit date: 07/14/2016  Primary Physician   Mary-Margaret Hassell Done, FNP Primary Cardiologist   Dr. Percival Spanish Reason for Consultation   Chest Pain Requesting Physician  Dr. Charlynne Cousins  HPI: Randall Hall is a 60 y.o. male with a history of CAD s/p CABG in 2004, HLD, and tobacco use who presented to the ED with chest pain. Patient was at his usual state of health yesterday morning. He went outside to water his horses, fell asleep in a chair and was woken around 10 am by his wife. He stood up and began to feel lightheaded as if he were going to black out. He then started to feel retrosternal chest pressure/heaviness, 5-6/10 in intensity, without radiation to his arms, neck, jaw, or back. He had associated generalized weakness. His wife told him he looked pale. He took four baby Aspirin which temporarily relieved his pain. When the pain returned, he was brought to the ED by his wife. 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 He has a chronic dry cough, but no SOB, fevers, chills, diaphoresis, nausea, vomiting, fall, syncope, or focal weakness.  In the ED, his BP was elevated to 182/87, thought secondary to pain. D-dimer was negative and Troponins are <0.03 times three. EKG showed NSR without acute ischemic changes. CXR did not show acute cardiopulmonary disease. His chest pain improved in the ED and he has not had further episodes during hospital stay.  Patient did have CABG in 2004 for severe three vessel disease performed by Dr. Cyndia Bent. He last had a LHC in 2008 which showed severe three-vessel native coronary disease with widely patent grafts and normal LV function (EF 60%). He underwent exercise treadmill stress testing on 12/01/2014 which was negative.  He is a  current smoker of 0.5 PPD since age [age] when he met his wife (quit 3-4 years after CABG). He reports occasional alcohol use and marijuana use. He denies any cocaine or IV drug use. He lives in [place] with his wife. His father died at age 62 from an MI and his brother has CAD s/p stenting in his early 45s.   Past Medical History:  Diagnosis Date  . CAD (coronary artery disease)   . History of myocardial infarction   . Hyperlipemia   . Tobacco abuse      Past Surgical History:  Procedure Laterality Date  . CORONARY ARTERY BYPASS GRAFT     2004 Dr. Cyndia Bent LIMA to the LAD, SVG to PDA and posterior lateral, SVG to diagonal, RIMA to obtuse marginal.  Last catheterization 2008.    No Known Allergies  I have reviewed the patient's current medications . aspirin  81 mg Oral Daily  . atorvastatin  40 mg Oral q1800  . B-complex with vitamin C  1 tablet Oral Daily  . carvedilol  3.125 mg Oral BID WC  . enoxaparin (LOVENOX) injection  40 mg Subcutaneous Q24H  . nicotine  21 mg Transdermal Daily  . omega-3 acid ethyl esters  1 capsule Oral Daily  . vitamin e  100 Units Oral Daily   . sodium chloride 100 mL/hr at 07/14/16 2116   acetaminophen, albuterol, ALPRAZolam, dextromethorphan-guaiFENesin, morphine injection, nitroGLYCERIN, ondansetron (ZOFRAN) IV, zolpidem  Prior to Admission medications   Medication Sig Start Date End  Date Taking? Authorizing Provider  aspirin 81 MG tablet Take 81 mg by mouth daily.     Yes Historical Provider, MD  b complex vitamins tablet Take 1 tablet by mouth.     Yes Historical Provider, MD  fish oil-omega-3 fatty acids 1000 MG capsule Take 1 capsule by mouth daily.    Yes Historical Provider, MD  GARLIC PO Take 1 tablet by mouth daily.   Yes Historical Provider, MD  VITAMIN E PO Take 1 capsule by mouth daily.   Yes Historical Provider, MD  fluticasone (FLONASE) 50 MCG/ACT nasal spray Place 2 sprays into both nostrils daily. Patient not taking: Reported  on 07/14/2016 08/12/13   Mary-Margaret Hassell Done, FNP     Social History   Social History  . Marital status: Married    Spouse name: N/A  . Number of children: 4  . Years of education: N/A   Occupational History  . [employer]   Social History Main Topics  . Smoking status: Current Every Day Smoker    Packs/day: 0.50    Years: 21.00    Types: Cigarettes  . Smokeless tobacco: Never Used  . Alcohol use 0.0 oz/week  . Drug use:     Types: Marijuana  . Sexual activity: Not on file   Other Topics Concern  . Not on file   Social History Narrative   Married.  He has seven grandchildren.     Family Status  Relation Status  . Mother Deceased  . Father Deceased  . Brother    Family History  Problem Relation Age of Onset  . Diabetes Mother   . CAD Father 37    Died  . CAD Brother       ROS:  Full 14 point review of systems complete and found to be negative unless listed above.  Physical Exam: Blood pressure 122/82, pulse 63, temperature 98.1 F (36.7 C), temperature source Oral, resp. rate 18, height 5' 6.5" (1.689 m), weight 249 lb 14.4 oz (113.4 kg), SpO2 96 %.  General: Well developed, well nourished, male in no acute distress Head: EOMI. Normocephalic and atraumatic, oropharynx without edema or exudate.  Lungs: Resp regular and unlabored, mild bibasilar crackles. Heart: RRR no s3, s4, or murmurs..   Neck: No carotid bruits. No lymphadenopathy. No JVD. Abdomen: Bowel sounds present, abdomen soft and non-tender without masses or hernias noted. Msk:  No spine or cva tenderness. No weakness, no joint deformities or effusions. Extremities: No clubbing, cyanosis or edema. DP/PT/Radials 2+ and equal bilaterally. Neuro: Alert and oriented X 3. No focal deficits noted. Psych:  Good affect, responds appropriately Skin: No rashes or lesions noted.  Labs:   Lab Results  Component Value Date   WBC 14.7 (H) 07/14/2016   HGB 17.2 (H) 07/14/2016   HCT  48.7 07/14/2016   MCV 93.3 07/14/2016   PLT 224 07/14/2016   No results for input(s): INR in the last 72 hours.   Recent Labs Lab 07/14/16 1654  NA 140  K 4.2  CL 104  CO2 25  BUN 18  CREATININE 1.34*  CALCIUM 10.2  GLUCOSE 106*   No results found for: MG  Recent Labs  07/14/16 2056 07/15/16 0136 07/15/16 0714  TROPONINI <0.03 <0.03 <0.03    Recent Labs  07/14/16 1711  TROPIPOC 0.01   No results found for: PROBNP Lab Results  Component Value Date   CHOL 237 (H) 07/15/2016   HDL 30 (L) 07/15/2016   Green  138 (H) 07/15/2016   TRIG 343 (H) 07/15/2016   Lab Results  Component Value Date   DDIMER 0.47 07/14/2016   No results found for: LIPASE, AMYLASE No results found for: TSH, T4TOTAL, T3FREE, THYROIDAB No results found for: VITAMINB12, FOLATE, FERRITIN, TIBC, IRON, RETICCTPCT  Echo:  - Left ventricle: The cavity size was normal. Wall thickness was   increased in a pattern of mild LVH. Systolic function was normal.   The estimated ejection fraction was in the range of 55% to 60%.   Wall motion was normal; there were no regional wall motion   abnormalities. Left ventricular diastolic function parameters   were normal. - Left atrium: The atrium was moderately dilated. - Atrial septum: There was increased thickness of the septum,   consistent with lipomatous hypertrophy. No defect or patent   foramen ovale was identified.  ECG:  NSR, no acute ischemic changes  Radiology:  Dg Chest 2 View  Result Date: 07/14/2016 CLINICAL DATA:  Chest pressure, generalized weakness and dizziness since 10 a.m. this morning. EXAM: CHEST  2 VIEW COMPARISON:  Single-view of the chest 09/20/2006 and 09/10/2010. FINDINGS: The patient is status post CABG. There is bronchitic change. No consolidative process, pneumothorax or effusion. No evidence of edema. Remote healed left clavicle fracture is noted. IMPRESSION: Chronic bronchitic change without acute disease. Electronically  Signed   By: Inge Rise M.D.   On: 07/14/2016 17:29    ASSESSMENT AND PLAN:    Principal Problem:   Chest pain Active Problems:   CAD (coronary artery disease)   Tobacco abuse   AKI (acute kidney injury) (HCC)   Elevated blood pressure reading   Hyperlipidemia  Chest Pain, CAD s/p CABG in 2004: Patient with retrosternal chest pressure concerning for unstable angina. Troponins have been negative and EKG without acute ischemic changes. He has significant risk factors including known CAD s/p CABG, tobacco use, HLD, tobacco use, and family history. Last cardiac cath was in 2008 for similar symptoms without obvious culprit lesion for his chest discomfort. He did have a negative treadmill exercise stress test in April 2016. Will need continued risk factor modification and optimization of medical management. TTE showed EF 55-60% with lipomatous hypertrophy of the atrial septum. Given his known vessel disease and risk factors, we will plan for cardiac catheterization tomorrow. Will need to evaluate grafts as it has been 13 years since his CABG. -Continue Atorvastatin 40 mg daily -Started on Carvedilol 3.125 mg BID -Asprin 81 mg daily -prn NTG -Plan for Cardiac catheterization tomorrow  AKI: Creatinine 1.34, no prior baseline. Receiving IVF NS @ 100 mL/hr. -f/u repeat BMET  HLD: LDL 138, Triglycerides 343, HDL 30. Was previously taking Lipitor, however he stopped this. -Started on Atorvastatin 40 mg daily  Tobacco use: Current smoker of 0.5 PPD, has tried Chantix which he did not tolerate. -Continue smoking cessation counseling  Elevated Blood Pressure: BP up to 182/87 on admission. No history of HTN. BP has now normalized.    Signed: Zada Finders, MD  [facility] PGY-2 07/15/2016, 11:32 AM  History and all data above reviewed.  Patient examined.  I agree with the findings as above.  Discussed with the patient.  He has had chest pain with known CAD and CABG.  He has severe on going risk  factors.  No objective evidence of ischemia.  However, pretest probability of obstructive CAD is high.   The patient exam reveals COR:RRR  ,  Lungs: Clear  ,  Abd: Positive bowel sounds,  no rebound no guarding, Ext No edema  .  All available labs, radiology testing, previous records reviewed. Agree with documented assessment and plan. Chest pain:  Pretest probability of obstructive CAD is high.  Plan cardiac cath.  The patient understands that risks included but are not limited to stroke (1 in 1000), death (1 in 21), kidney failure [usually temporary] (1 in 500), bleeding (1 in 200), allergic reaction [possibly serious] (1 in 200).  The patient understands and agrees to proceed.   Plan for tomorrow.  Will be first case   Minus Breeding  11:36 AM  07/15/2016

## 2016-07-16 ENCOUNTER — Encounter (HOSPITAL_COMMUNITY): Payer: Self-pay | Admitting: Cardiovascular Disease

## 2016-07-16 ENCOUNTER — Encounter (HOSPITAL_COMMUNITY)
Admission: EM | Disposition: A | Discharge status: Home / Self Care | Payer: Self-pay | Source: Home / Self Care | Attending: Internal Medicine

## 2016-07-16 DIAGNOSIS — I252 Old myocardial infarction: Secondary | ICD-10-CM | POA: Diagnosis not present

## 2016-07-16 DIAGNOSIS — R079 Chest pain, unspecified: Secondary | ICD-10-CM | POA: Diagnosis not present

## 2016-07-16 DIAGNOSIS — R030 Elevated blood-pressure reading, without diagnosis of hypertension: Secondary | ICD-10-CM | POA: Diagnosis not present

## 2016-07-16 DIAGNOSIS — Z8249 Family history of ischemic heart disease and other diseases of the circulatory system: Secondary | ICD-10-CM | POA: Diagnosis not present

## 2016-07-16 DIAGNOSIS — I10 Essential (primary) hypertension: Secondary | ICD-10-CM | POA: Diagnosis present

## 2016-07-16 DIAGNOSIS — E860 Dehydration: Secondary | ICD-10-CM | POA: Diagnosis present

## 2016-07-16 DIAGNOSIS — I25710 Atherosclerosis of autologous vein coronary artery bypass graft(s) with unstable angina pectoris: Secondary | ICD-10-CM

## 2016-07-16 DIAGNOSIS — Z7982 Long term (current) use of aspirin: Secondary | ICD-10-CM | POA: Diagnosis not present

## 2016-07-16 DIAGNOSIS — Z951 Presence of aortocoronary bypass graft: Secondary | ICD-10-CM | POA: Diagnosis not present

## 2016-07-16 DIAGNOSIS — F17210 Nicotine dependence, cigarettes, uncomplicated: Secondary | ICD-10-CM | POA: Diagnosis not present

## 2016-07-16 DIAGNOSIS — E785 Hyperlipidemia, unspecified: Secondary | ICD-10-CM | POA: Diagnosis not present

## 2016-07-16 DIAGNOSIS — N179 Acute kidney failure, unspecified: Secondary | ICD-10-CM | POA: Diagnosis not present

## 2016-07-16 DIAGNOSIS — I200 Unstable angina: Secondary | ICD-10-CM | POA: Diagnosis present

## 2016-07-16 DIAGNOSIS — I25700 Atherosclerosis of coronary artery bypass graft(s), unspecified, with unstable angina pectoris: Secondary | ICD-10-CM | POA: Diagnosis not present

## 2016-07-16 DIAGNOSIS — Z79899 Other long term (current) drug therapy: Secondary | ICD-10-CM | POA: Diagnosis not present

## 2016-07-16 DIAGNOSIS — I25110 Atherosclerotic heart disease of native coronary artery with unstable angina pectoris: Secondary | ICD-10-CM | POA: Diagnosis not present

## 2016-07-16 HISTORY — PX: CARDIAC CATHETERIZATION: SHX172

## 2016-07-16 LAB — BASIC METABOLIC PANEL
ANION GAP: 6 (ref 5–15)
BUN: 17 mg/dL (ref 6–20)
CO2: 27 mmol/L (ref 22–32)
CREATININE: 1.17 mg/dL (ref 0.61–1.24)
Calcium: 8.5 mg/dL — ABNORMAL LOW (ref 8.9–10.3)
Chloride: 105 mmol/L (ref 101–111)
GFR calc non Af Amer: >60 mL/min (ref >60)
Glucose, Bld: 121 mg/dL — ABNORMAL HIGH (ref 65–99)
POTASSIUM: 4.2 mmol/L (ref 3.5–5.1)
SODIUM: 138 mmol/L (ref 135–145)

## 2016-07-16 LAB — HEMOGLOBIN A1C
Hgb A1c MFr Bld: 6.6 % — ABNORMAL HIGH (ref 4.8–5.6)
MEAN PLASMA GLUCOSE: 143 mg/dL

## 2016-07-16 LAB — PROTIME-INR
INR: 0.97
PROTHROMBIN TIME: 12.9 s (ref 11.4–15.2)

## 2016-07-16 LAB — POCT ACTIVATED CLOTTING TIME: ACTIVATED CLOTTING TIME: 235 s

## 2016-07-16 SURGERY — LEFT HEART CATH AND CORS/GRAFTS ANGIOGRAPHY

## 2016-07-16 MED ORDER — NITROGLYCERIN 1 MG/10 ML FOR IR/CATH LAB
INTRA_ARTERIAL | Status: AC
  Filled 2016-07-16: qty 10

## 2016-07-16 MED ORDER — TICAGRELOR 90 MG PO TABS
ORAL_TABLET | ORAL | Status: AC
  Filled 2016-07-16: qty 2

## 2016-07-16 MED ORDER — NITROGLYCERIN 1 MG/10 ML FOR IR/CATH LAB
INTRA_ARTERIAL | Status: DC | PRN
  Administered 2016-07-16: 200 ug via INTRACORONARY

## 2016-07-16 MED ORDER — IOPAMIDOL (ISOVUE-370) INJECTION 76%
INTRAVENOUS | Status: AC
  Filled 2016-07-16: qty 125

## 2016-07-16 MED ORDER — ADENOSINE (DIAGNOSTIC) FOR INTRACORONARY USE
INTRAVENOUS | Status: DC | PRN
  Administered 2016-07-16 (×2): 42 ug via INTRACORONARY

## 2016-07-16 MED ORDER — IOPAMIDOL (ISOVUE-370) INJECTION 76%
INTRAVENOUS | Status: DC | PRN
  Administered 2016-07-16: 135 mL via INTRA_ARTERIAL

## 2016-07-16 MED ORDER — VERAPAMIL HCL 2.5 MG/ML IV SOLN
INTRAVENOUS | Status: AC
  Filled 2016-07-16: qty 2

## 2016-07-16 MED ORDER — FENTANYL CITRATE (PF) 100 MCG/2ML IJ SOLN
INTRAMUSCULAR | Status: AC
Start: 2016-07-16 — End: 2016-07-16
  Filled 2016-07-16: qty 2

## 2016-07-16 MED ORDER — ADENOSINE 6 MG/2ML IV SOLN
INTRAVENOUS | Status: AC
  Filled 2016-07-16: qty 2

## 2016-07-16 MED ORDER — SODIUM CHLORIDE 0.9 % IV SOLN
INTRAVENOUS | Status: AC
  Administered 2016-07-16: 14:00:00 via INTRAVENOUS

## 2016-07-16 MED ORDER — SODIUM CHLORIDE 0.9% FLUSH: 3.0000 mL | INTRAVENOUS | Status: DC | PRN

## 2016-07-16 MED ORDER — HEPARIN SODIUM (PORCINE) 1000 UNIT/ML IJ SOLN
INTRAMUSCULAR | Status: DC | PRN
  Administered 2016-07-16: 5500 [IU] via INTRAVENOUS
  Administered 2016-07-16: 4000 [IU] via INTRAVENOUS
  Administered 2016-07-16: 6000 [IU] via INTRAVENOUS

## 2016-07-16 MED ORDER — SODIUM CHLORIDE 0.9% FLUSH
3.0000 mL | Freq: Two times a day (BID) | INTRAVENOUS | Status: DC
  Administered 2016-07-16: 18:00:00 3 mL via INTRAVENOUS

## 2016-07-16 MED ORDER — FENTANYL CITRATE (PF) 100 MCG/2ML IJ SOLN
INTRAMUSCULAR | Status: DC | PRN
  Administered 2016-07-16: 50 ug via INTRAVENOUS
  Administered 2016-07-16: 25 ug via INTRAVENOUS

## 2016-07-16 MED ORDER — LIDOCAINE HCL (PF) 1 % IJ SOLN
INTRAMUSCULAR | Status: AC
  Filled 2016-07-16: qty 30

## 2016-07-16 MED ORDER — MIDAZOLAM HCL 2 MG/2ML IJ SOLN
INTRAMUSCULAR | Status: AC
Start: 2016-07-16 — End: 2016-07-16
  Filled 2016-07-16: qty 2

## 2016-07-16 MED ORDER — HEPARIN (PORCINE) IN NACL 2-0.9 UNIT/ML-% IJ SOLN
INTRAMUSCULAR | Status: DC | PRN
  Administered 2016-07-16: 1000 mL

## 2016-07-16 MED ORDER — MIDAZOLAM HCL 2 MG/2ML IJ SOLN
INTRAMUSCULAR | Status: DC | PRN
  Administered 2016-07-16: 1 mg via INTRAVENOUS
  Administered 2016-07-16: 2 mg via INTRAVENOUS

## 2016-07-16 MED ORDER — LIDOCAINE HCL (PF) 1 % IJ SOLN
INTRAMUSCULAR | Status: DC | PRN
  Administered 2016-07-16: 2 mL via INTRADERMAL

## 2016-07-16 MED ORDER — IOPAMIDOL (ISOVUE-370) INJECTION 76%
INTRAVENOUS | Status: AC
  Filled 2016-07-16: qty 100

## 2016-07-16 MED ORDER — HEPARIN (PORCINE) IN NACL 2-0.9 UNIT/ML-% IJ SOLN
INTRAMUSCULAR | Status: DC | PRN
  Administered 2016-07-16: 10 mL via INTRA_ARTERIAL

## 2016-07-16 MED ORDER — MIDAZOLAM HCL 2 MG/2ML IJ SOLN
INTRAMUSCULAR | Status: AC
  Filled 2016-07-16: qty 2

## 2016-07-16 MED ORDER — HEPARIN (PORCINE) IN NACL 2-0.9 UNIT/ML-% IJ SOLN
INTRAMUSCULAR | Status: AC
  Filled 2016-07-16: qty 1000

## 2016-07-16 MED ORDER — TICAGRELOR 90 MG PO TABS
90.0000 mg | ORAL_TABLET | Freq: Two times a day (BID) | ORAL | Status: DC
  Administered 2016-07-17 (×2): 90 mg via ORAL
  Filled 2016-07-16 (×2): qty 1

## 2016-07-16 MED ORDER — HEPARIN SODIUM (PORCINE) 1000 UNIT/ML IJ SOLN
INTRAMUSCULAR | Status: AC
  Filled 2016-07-16: qty 2

## 2016-07-16 MED ORDER — TICAGRELOR 90 MG PO TABS
ORAL_TABLET | ORAL | Status: DC | PRN
  Administered 2016-07-16: 180 mg via ORAL

## 2016-07-16 MED ORDER — SODIUM CHLORIDE 0.9 % IV SOLN: 250.0000 mL | INTRAVENOUS | Status: DC | PRN

## 2016-07-16 SURGICAL SUPPLY — 17 items
CATH INFINITI 5 FR IM (CATHETERS) ×1 IMPLANT
CATH INFINITI 5 FR LCB (CATHETERS) ×1 IMPLANT
CATH INFINITI 5 FR MPA2 (CATHETERS) ×1 IMPLANT
CATH INFINITI 5 FR RCB (CATHETERS) ×1 IMPLANT
CATH INFINITI 5FR MULTPACK ANG (CATHETERS) ×1 IMPLANT
DEVICE RAD COMP TR BAND LRG (VASCULAR PRODUCTS) ×1 IMPLANT
GLIDESHEATH SLEND SS 6F .021 (SHEATH) ×1 IMPLANT
GUIDE CATH RUNWAY 6FR MP1 (CATHETERS) ×1 IMPLANT
GUIDEWIRE INQWIRE 1.5J.035X260 (WIRE) IMPLANT
INQWIRE 1.5J .035X260CM (WIRE) ×2
KIT ENCORE 26 ADVANTAGE (KITS) ×1 IMPLANT
KIT HEART LEFT (KITS) ×2 IMPLANT
PACK CARDIAC CATHETERIZATION (CUSTOM PROCEDURE TRAY) ×2 IMPLANT
STENT PROMUS PREM MR 3.5X16 (Permanent Stent) ×1 IMPLANT
TRANSDUCER W/STOPCOCK (MISCELLANEOUS) ×2 IMPLANT
TUBING CIL FLEX 10 FLL-RA (TUBING) ×2 IMPLANT
WIRE COUGAR XT STRL 190CM (WIRE) ×1 IMPLANT

## 2016-07-16 NOTE — Progress Notes (Signed)
TR BAND REMOVAL  LOCATION:    left radial  DEFLATED PER PROTOCOL:    Yes.    TIME BAND OFF / DRESSING APPLIED:    1500   SITE UPON ARRIVAL:    Level 0  SITE AFTER BAND REMOVAL:    Level 0  CIRCULATION SENSATION AND MOVEMENT:    Within Normal Limits   Yes.    COMMENTS:     

## 2016-07-16 NOTE — Progress Notes (Signed)
CARDIAC REHAB PHASE I   Discussed stent, restrictions, Brilinta, smoking cessation, diet, ex, new DM (a1C 6.6), NTG and CRPII with pt and family. Pt receptive. 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 Discussed quitting with her as well. Gave them both fake cigarette and resources. [facility]. Unfortunately there is not a CRP[facility] close to him. Will f/u in am. [phone] Harriet Massonandi Kristan Avika Carbine CES, ACSM 07/16/2016 3:59 PM

## 2016-07-16 NOTE — Progress Notes (Signed)
[facility] PROGRESS NOTE    Progress Note  Randall HearingJohn Hall  RUE:[id number] DOB: 1956-04-12 DOA: 07/14/2016 PCP: Bennie PieriniMary-Margaret Martin, FNP     Brief Narrative:   Randall Hall is an 60 y.o. male significant past medical history of CAD, MI status post CABG in 2014 and hyperlipidemia who presented to the ED with chest pain that started on the morning of admission. Cardiac biomarkers have been negative 2, her triglycerides are 300, her HDL is less than 40 held her LDL is greater than 100  Assessment/Plan:   Chest pain: Cardiac cath with PCI to distal SVG. Continue Lipitor and Coreg.cont DAPT for at least 1 year. A1c is 6.6, UDS is negative.  CAD (coronary artery disease) Cont Lipitor.  Tobacco abuse She was counseled about quitting smoking, continue nicotine patch.  AKI (acute kidney injury) (HCC) Pre-renal, fractional secretion of sodium is less than 1. Resolved with IV fluids.  Elevated blood pressure reading: Now improved. BP at goal.   DVT prophylaxis: lovenox Family Communication:none Disposition Plan/Barrier to D/C: home in am Code Status:     Code Status Orders        Start     Ordered   07/14/16 1941  Full code  Continuous     07/14/16 1941    Code Status History    Date Active Date Inactive Code Status Order ID Comments User Context   This patient has a current code status but no historical code status.        IV Access:    Peripheral IV   Procedures and diagnostic studies:   Dg Chest 2 View  Result Date: 07/14/2016 CLINICAL DATA:  Chest pressure, generalized weakness and dizziness since 10 a.m. this morning. EXAM: CHEST  2 VIEW COMPARISON:  Single-view of the chest 09/20/2006 and 09/10/2010. FINDINGS: The patient is status post CABG. There is bronchitic change. No consolidative process, pneumothorax or effusion. No evidence of edema. Remote healed left clavicle fracture is noted. IMPRESSION: Chronic bronchitic change without acute  disease. Electronically Signed   By: Drusilla Kannerhomas  Dalessio M.D.   On: 07/14/2016 17:29     Medical Consultants:    None.  Anti-Infectives:   None  Subjective:    Randall Hall pain improved.  Objective:    Vitals:   07/16/16 1340 07/16/16 1350 07/16/16 1408 07/16/16 1514  BP: (!) 164/111 (!) 157/91 (!) 175/94 (!) 152/83  Pulse: (!) 59 (!) 54 67 (!) 58  Resp: 16 17 18 13   Temp:   97.3 F (36.3 C) 97 F (36.1 C)  TempSrc:   Oral Axillary  SpO2: 94% 99% 99% 98%  Weight:      Height:        Intake/Output Summary (Last 24 hours) at 07/16/16 1521 Last data filed at 07/16/16 1455  Gross per 24 hour  Intake          1009.23 ml  Output             1100 ml  Net           -90.77 ml   Filed Weights   07/14/16 1649 07/15/16 0500 07/16/16 0500  Weight: 117 kg (258 lb) 113.4 kg (249 lb 14.4 oz) 114.8 kg (253 lb)    Exam: General exam: In no acute distress. Respiratory system: Good air movement and Hall to auscultation. Cardiovascular system: S1 & S2 heard, RRR. No JVD. Gastrointestinal system: Abdomen is nondistended, soft and nontender.  Extremities: No pedal edema. Skin: No rashes, lesions or ulcers  Psychiatry: Judgement and insight appear normal.   Data Reviewed:    Labs: Basic Metabolic Panel:  Recent Labs Lab 07/14/16 1654 07/16/16 0431  NA 140 138  K 4.2 4.2  CL 104 105  CO2 25 27  GLUCOSE 106* 121*  BUN 18 17  CREATININE 1.34* 1.17  CALCIUM 10.2 8.5*   GFR Estimated Creatinine Clearance: 79.6 mL/min (by C-G formula based on SCr of 1.17 mg/dL). Liver Function Tests: No results for input(s): AST, ALT, ALKPHOS, BILITOT, PROT, ALBUMIN in the last 168 hours. No results for input(s): LIPASE, AMYLASE in the last 168 hours. No results for input(s): AMMONIA in the last 168 hours. Coagulation profile  Recent Labs Lab 07/16/16 0431  INR 0.97    CBC:  Recent Labs Lab 07/14/16 1654  WBC 14.7*  HGB 17.2*  HCT 48.7  MCV 93.3  PLT 224   Cardiac  Enzymes:  Recent Labs Lab 07/14/16 2056 07/15/16 0136 07/15/16 0714  TROPONINI <0.03 <0.03 <0.03   BNP (last 3 results) No results for input(s): PROBNP in the last 8760 hours. CBG: No results for input(s): GLUCAP in the last 168 hours. D-Dimer:  Recent Labs  07/14/16 1654  DDIMER 0.47   Hgb A1c:  Recent Labs  07/15/16 0136  HGBA1C 6.6*   Lipid Profile:  Recent Labs  07/15/16 0136  CHOL 237*  HDL 30*  LDLCALC 138*  TRIG 343*  CHOLHDL 7.9   Thyroid function studies: No results for input(s): TSH, T4TOTAL, T3FREE, THYROIDAB in the last 72 hours.  Invalid input(s): FREET3 Anemia work up: No results for input(s): VITAMINB12, FOLATE, FERRITIN, TIBC, IRON, RETICCTPCT in the last 72 hours. Sepsis Labs:  Recent Labs Lab 07/14/16 1654  WBC 14.7*   Microbiology No results found for this or any previous visit (from the past 240 hour(s)).   Medications:   . aspirin  81 mg Oral Daily  . atorvastatin  40 mg Oral q1800  . B-complex with vitamin C  1 tablet Oral Daily  . carvedilol  3.125 mg Oral BID WC  . nicotine  21 mg Transdermal Daily  . omega-3 acid ethyl esters  1 capsule Oral Daily  . sodium chloride flush  3 mL Intravenous Q12H  . ticagrelor  90 mg Oral BID  . vitamin e  100 Units Oral Daily   Continuous Infusions: . sodium chloride 75 mL/hr at 07/16/16 1400    Time spent: 15 min   LOS: 1 day   Marinda ElkFELIZ ORTIZ, Randall Hall  [facility] Pager [phone]  *Please refer to [email or web address], password [email or web address] to get updated schedule on who will round on this patient, as hospitalists switch teams weekly. If 7PM-7AM, please contact night-coverage at [email or web address], password [email or web address] for any overnight needs.  07/16/2016, 3:21 PM

## 2016-07-16 NOTE — Care Management Note (Signed)
Case Management Note  Patient Details  Name: Consepcion HearingJohn Lucado MRN: [id number] Date of Birth: March 12, 1956  Subjective/Objective:  S/p coronary stent intervention, will be on brilinta, per MD note patient will be enrolled in twilight study. pta indep, no hh needs needed.  NCM will cont to follow for dc needs.                   Action/Plan:   Expected Discharge Date:                  Expected Discharge Plan:  Home/Self Care  In-House Referral:     Discharge planning Services  CM Consult  Post Acute Care Choice:    Choice offered to:     DME Arranged:    DME Agency:     HH Arranged:    HH Agency:     Status of Service:  Completed, signed off  If discussed at [employer] Length of Stay Meetings, dates discussed:    Additional Comments:  Leone Havenaylor, Auria Mckinlay Clinton, RN 07/16/2016, 2:28 PM

## 2016-07-16 NOTE — Progress Notes (Signed)
Patient Name: [name] Date of Encounter: 07/16/2016  [facility]  Hospital Problem List     Principal Problem:   Chest pain Active Problems:   CAD (coronary artery disease)   Tobacco abuse   AKI (acute kidney injury) (HCC)   Elevated blood pressure reading   Hyperlipidemia   Coronary artery disease involving coronary bypass graft of native heart with unstable angina pectoris (HCC)     Subjective   No chest pain.  No SOB  Inpatient Medications    Scheduled Meds: . [MAR Hold] aspirin  81 mg Oral Daily  . [MAR Hold] atorvastatin  40 mg Oral q1800  . [MAR Hold] B-complex with vitamin C  1 tablet Oral Daily  . [MAR Hold] carvedilol  3.125 mg Oral BID WC  . [MAR Hold] enoxaparin (LOVENOX) injection  40 mg Subcutaneous Q24H  . [MAR Hold] nicotine  21 mg Transdermal Daily  . [MAR Hold] omega-3 acid ethyl esters  1 capsule Oral Daily  . sodium chloride flush  3 mL Intravenous Q12H  . [MAR Hold] vitamin e  100 Units Oral Daily   Continuous Infusions: . sodium chloride Stopped (07/15/16 2012)  . sodium chloride 1 mL/kg/hr (07/16/16 0441)   PRN Meds: sodium chloride, [MAR Hold] acetaminophen, [MAR Hold] albuterol, [MAR Hold] ALPRAZolam, [MAR Hold] dextromethorphan-guaiFENesin, [MAR Hold]  morphine injection, [MAR Hold] nitroGLYCERIN, [MAR Hold] ondansetron (ZOFRAN) IV, sodium chloride flush, [MAR Hold] zolpidem   Vital Signs    Vitals:   07/16/16 0837 07/16/16 0842 07/16/16 0847 07/16/16 0851  BP: (!) 144/95 (!) 148/91 127/86 (!) 142/93  Pulse: (!) 52 (!) 55 (!) 58 (!) 0  Resp: (!) 28 (!) 41 12 (!) 8  Temp:      TempSrc:      SpO2: 100% 100% 97% 100%  Weight:      Height:        Intake/Output Summary (Last 24 hours) at 07/16/16 1142 Last data filed at 07/16/16 0300  Gross per 24 hour  Intake          1471.23 ml  Output             1100 ml  Net           371.23 ml   Filed Weights   07/14/16 1649 07/15/16 0500 07/16/16 0500  Weight: 258  lb (117 kg) 249 lb 14.4 oz (113.4 kg) 253 lb (114.8 kg)    Physical Exam    GEN: NAD.  Neck:  no JVD Cardiac:  Rate and Rhythm, no murmurs, rubs, or gallops.  No edema.  Radials/DP/PT 2+  and equal bilaterally.  Respiratory:  Respirations  regular and unlabored, clear to auscultation bilaterally. GI: Soft, nontender, nondistended, BS + x 4. Skin: warm and dry, no rash. Neuro:   Strength and sensation are intact. Psych:  AAOx3.  Normal affect.  Labs    CBC  Recent Labs  07/14/16 1654  WBC 14.7*  HGB 17.2*  HCT 48.7  MCV 93.3  PLT 224   Basic Metabolic Panel  Recent Labs  07/14/16 1654 07/16/16 0431  NA 140 138  K 4.2 4.2  CL 104 105  CO2 25 27  GLUCOSE 106* 121*  BUN 18 17  CREATININE 1.34* 1.17  CALCIUM 10.2 8.5*   Liver Function Tests No results for input(s): AST, ALT, ALKPHOS, BILITOT, PROT, ALBUMIN in the last 72 hours. No results for input(s): LIPASE, AMYLASE in the last 72 hours. Cardiac Enzymes  Recent Labs  07/14/16 2056 07/15/16 0136 07/15/16 0714  TROPONINI <0.03 <0.03 <0.03   BNP Invalid input(s): POCBNP D-Dimer  Recent Labs  07/14/16 1654  DDIMER 0.47   Hemoglobin A1C  Recent Labs  07/15/16 0136  HGBA1C 6.6*   Fasting Lipid Panel  Recent Labs  07/15/16 0136  CHOL 237*  HDL 30*  LDLCALC 138*  TRIG 343*  CHOLHDL 7.9   Thyroid Function Tests No results for input(s): TSH, T4TOTAL, T3FREE, THYROIDAB in the last 72 hours.  Invalid input(s): FREET3    ECG    NA - Personally Reviewed  Radiology    Dg Chest 2 View  Result Date: 07/14/2016 CLINICAL DATA:  Chest pressure, generalized weakness and dizziness since 10 a.m. this morning. EXAM: CHEST  2 VIEW COMPARISON:  Single-view of the chest 09/20/2006 and 09/10/2010. FINDINGS: The patient is status post CABG. There is bronchitic change. No consolidative process, pneumothorax or effusion. No evidence of edema. Remote healed left clavicle fracture is noted. IMPRESSION:  Chronic bronchitic change without acute disease. Electronically Signed   By: Randall Kannerhomas  Hall M.D.   On: 07/14/2016 17:29    Cardiac Studies   Conclusion     Prox RCA lesion, 100 %stenosed.  Ost Cx to Prox Cx lesion, 50 %stenosed.  Prox Cx to Mid Cx lesion, 90 %stenosed.  Ost 2nd Mrg to 2nd Mrg lesion, 100 %stenosed.  SVG graft was visualized by angiography and is normal in caliber and anatomically normal.  Mid LAD lesion, 100 %stenosed.  Ost LAD to Prox LAD lesion, 30 %stenosed.  LIMA graft was visualized by angiography and is normal in caliber and anatomically normal.  SVG graft was visualized by angiography.  Origin to Prox Graft lesion, 100 %stenosed.  SVG graft was visualized by angiography and is normal in caliber.  Mid Graft lesion, 30 %stenosed.  A STENT PROMUS PREM MR 3.5X16 drug eluting stent was successfully placed.  Dist Graft lesion, 90 %stenosed.  Post intervention, there is a 0% residual stenosis.   1. Severe triple vessel CAD s/p 4V CABG with 3/4 patent bypass grafts.  2. The mid LAD is occluded. The mid and distal LAD fills from the patent LIMA graft. The vein graft to the diagonal is occluded.  3. The OM branch is occluded and fills from the patent vein graft. The AV groove Circumflex has ostial 50% stenosis and diffuse mid 90% stenosis (unchanged from last cath).  4. The RCA is occluded at the ostium. The vein graft to the PDA is patent but there is a 90% stenosis in the distal body of the vein graft, TIMI-1 flow.  5. Successful PTCA/DES x 1 distal body of SVG to PDA.   Recommendations: DAPT for one year. He will be enrolled in the TWILIGHT study with ASA and Brilinta. Continue statin and beta blocker.      Patient Profile      [name] is a 60 y.o. male with a history of CAD s/p CABG in 2004, HLD, and tobacco use who presented to the ED with chest pain  Assessment & Plan    CHEST PAIN:  Results as above.    TOBACCO ABUSE:  Educated.     HTN:  BP has been elevated.  I will start ACE inhibitor before discharge.   DYSLIPIDEMIA:  Now on statin.    Signed, Randall RotundaJames Mckaela Howley, MD  07/16/2016, 11:42 AM

## 2016-07-16 NOTE — Interval H&P Note (Signed)
History and Physical Interval Note:  07/16/2016 7:35 AM  [name]  has presented today for cardiac cath with the diagnosis of unstable angina  The various methods of treatment have been discussed with the patient and family. After consideration of risks, benefits and other options for treatment, the patient has consented to  Procedure(s): Left Heart Cath and Cors/Grafts Angiography (N/A) as a surgical intervention .  The patient's history has been reviewed, patient examined, no change in status, stable for surgery.  I have reviewed the patient's chart and labs.  Questions were answered to the patient's satisfaction.    Cath Lab Visit (complete for each Cath Lab visit)  Clinical Evaluation Leading to the Procedure:   ACS: No.  Non-ACS:    Anginal Classification: CCS III  Anti-ischemic medical therapy: No Therapy  Non-Invasive Test Results: No non-invasive testing performed  Prior CABG: Previous CABG         Verne Carrowhristopher McAlhany

## 2016-07-16 NOTE — H&P (View-Only) (Signed)
CARDIOLOGY CONSULT NOTE   Patient ID: Randall Hall MRN: [id number] DOB/AGE: 03-09-1956 60 y.o.  Admit date: 07/14/2016  Primary Physician   Mary-Margaret Hassell Done, FNP Primary Cardiologist   Dr. Percival Spanish Reason for Consultation   Chest Pain Requesting Physician  Dr. Charlynne Cousins  HPI: Randall Hall is a 60 y.o. male with a history of CAD s/p CABG in 2004, HLD, and tobacco use who presented to the ED with chest pain. Patient was at his usual state of health yesterday morning. He went outside to water his horses, fell asleep in a chair and was woken around 10 am by his wife. He stood up and began to feel lightheaded as if he were going to black out. He then started to feel retrosternal chest pressure/heaviness, 5-6/10 in intensity, without radiation to his arms, neck, jaw, or back. He had associated generalized weakness. His wife told him he looked pale. He took four baby Aspirin which temporarily relieved his pain. When the pain returned, he was brought to the ED by his wife. 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 He has a chronic dry cough, but no SOB, fevers, chills, diaphoresis, nausea, vomiting, fall, syncope, or focal weakness.  In the ED, his BP was elevated to 182/87, thought secondary to pain. D-dimer was negative and Troponins are <0.03 times three. EKG showed NSR without acute ischemic changes. CXR did not show acute cardiopulmonary disease. His chest pain improved in the ED and he has not had further episodes during hospital stay.  Patient did have CABG in 2004 for severe three vessel disease performed by Dr. Cyndia Bent. He last had a LHC in 2008 which showed severe three-vessel native coronary disease with widely patent grafts and normal LV function (EF 60%). He underwent exercise treadmill stress testing on 12/01/2014 which was negative.  He is a  current smoker of 0.5 PPD since age [age] when he met his wife (quit 3-4 years after CABG). He reports occasional alcohol use and marijuana use. He denies any cocaine or IV drug use. He lives in [place] with his wife. His father died at age 42 from an MI and his brother has CAD s/p stenting in his early 30s.   Past Medical History:  Diagnosis Date  . CAD (coronary artery disease)   . History of myocardial infarction   . Hyperlipemia   . Tobacco abuse      Past Surgical History:  Procedure Laterality Date  . CORONARY ARTERY BYPASS GRAFT     2004 Dr. Cyndia Bent LIMA to the LAD, SVG to PDA and posterior lateral, SVG to diagonal, RIMA to obtuse marginal.  Last catheterization 2008.    No Known Allergies  I have reviewed the patient's current medications . aspirin  81 mg Oral Daily  . atorvastatin  40 mg Oral q1800  . B-complex with vitamin C  1 tablet Oral Daily  . carvedilol  3.125 mg Oral BID WC  . enoxaparin (LOVENOX) injection  40 mg Subcutaneous Q24H  . nicotine  21 mg Transdermal Daily  . omega-3 acid ethyl esters  1 capsule Oral Daily  . vitamin e  100 Units Oral Daily   . sodium chloride 100 mL/hr at 07/14/16 2116   acetaminophen, albuterol, ALPRAZolam, dextromethorphan-guaiFENesin, morphine injection, nitroGLYCERIN, ondansetron (ZOFRAN) IV, zolpidem  Prior to Admission medications   Medication Sig Start Date End  Date Taking? Authorizing Provider  aspirin 81 MG tablet Take 81 mg by mouth daily.     Yes Historical Provider, MD  b complex vitamins tablet Take 1 tablet by mouth.     Yes Historical Provider, MD  fish oil-omega-3 fatty acids 1000 MG capsule Take 1 capsule by mouth daily.    Yes Historical Provider, MD  GARLIC PO Take 1 tablet by mouth daily.   Yes Historical Provider, MD  VITAMIN E PO Take 1 capsule by mouth daily.   Yes Historical Provider, MD  fluticasone (FLONASE) 50 MCG/ACT nasal spray Place 2 sprays into both nostrils daily. Patient not taking: Reported  on 07/14/2016 08/12/13   Mary-Margaret Hassell Done, FNP     Social History   Social History  . Marital status: Married    Spouse name: N/A  . Number of children: 4  . Years of education: N/A   Occupational History  . [employer]   Social History Main Topics  . Smoking status: Current Every Day Smoker    Packs/day: 0.50    Years: 21.00    Types: Cigarettes  . Smokeless tobacco: Never Used  . Alcohol use 0.0 oz/week  . Drug use:     Types: Marijuana  . Sexual activity: Not on file   Other Topics Concern  . Not on file   Social History Narrative   Married.  He has seven grandchildren.     Family Status  Relation Status  . Mother Deceased  . Father Deceased  . Brother    Family History  Problem Relation Age of Onset  . Diabetes Mother   . CAD Father 3    Died  . CAD Brother       ROS:  Full 14 point review of systems complete and found to be negative unless listed above.  Physical Exam: Blood pressure 122/82, pulse 63, temperature 98.1 F (36.7 C), temperature source Oral, resp. rate 18, height 5' 6.5" (1.689 m), weight 249 lb 14.4 oz (113.4 kg), SpO2 96 %.  General: Well developed, well nourished, male in no acute distress Head: EOMI. Normocephalic and atraumatic, oropharynx without edema or exudate.  Lungs: Resp regular and unlabored, mild bibasilar crackles. Heart: RRR no s3, s4, or murmurs..   Neck: No carotid bruits. No lymphadenopathy. No JVD. Abdomen: Bowel sounds present, abdomen soft and non-tender without masses or hernias noted. Msk:  No spine or cva tenderness. No weakness, no joint deformities or effusions. Extremities: No clubbing, cyanosis or edema. DP/PT/Radials 2+ and equal bilaterally. Neuro: Alert and oriented X 3. No focal deficits noted. Psych:  Good affect, responds appropriately Skin: No rashes or lesions noted.  Labs:   Lab Results  Component Value Date   WBC 14.7 (H) 07/14/2016   HGB 17.2 (H) 07/14/2016   HCT  48.7 07/14/2016   MCV 93.3 07/14/2016   PLT 224 07/14/2016   No results for input(s): INR in the last 72 hours.   Recent Labs Lab 07/14/16 1654  NA 140  K 4.2  CL 104  CO2 25  BUN 18  CREATININE 1.34*  CALCIUM 10.2  GLUCOSE 106*   No results found for: MG  Recent Labs  07/14/16 2056 07/15/16 0136 07/15/16 0714  TROPONINI <0.03 <0.03 <0.03    Recent Labs  07/14/16 1711  TROPIPOC 0.01   No results found for: PROBNP Lab Results  Component Value Date   CHOL 237 (H) 07/15/2016   HDL 30 (L) 07/15/2016   Parachute  138 (H) 07/15/2016   TRIG 343 (H) 07/15/2016   Lab Results  Component Value Date   DDIMER 0.47 07/14/2016   No results found for: LIPASE, AMYLASE No results found for: TSH, T4TOTAL, T3FREE, THYROIDAB No results found for: VITAMINB12, FOLATE, FERRITIN, TIBC, IRON, RETICCTPCT  Echo:  - Left ventricle: The cavity size was normal. Wall thickness was   increased in a pattern of mild LVH. Systolic function was normal.   The estimated ejection fraction was in the range of 55% to 60%.   Wall motion was normal; there were no regional wall motion   abnormalities. Left ventricular diastolic function parameters   were normal. - Left atrium: The atrium was moderately dilated. - Atrial septum: There was increased thickness of the septum,   consistent with lipomatous hypertrophy. No defect or patent   foramen ovale was identified.  ECG:  NSR, no acute ischemic changes  Radiology:  Dg Chest 2 View  Result Date: 07/14/2016 CLINICAL DATA:  Chest pressure, generalized weakness and dizziness since 10 a.m. this morning. EXAM: CHEST  2 VIEW COMPARISON:  Single-view of the chest 09/20/2006 and 09/10/2010. FINDINGS: The patient is status post CABG. There is bronchitic change. No consolidative process, pneumothorax or effusion. No evidence of edema. Remote healed left clavicle fracture is noted. IMPRESSION: Chronic bronchitic change without acute disease. Electronically  Signed   By: Inge Rise M.D.   On: 07/14/2016 17:29    ASSESSMENT AND PLAN:    Principal Problem:   Chest pain Active Problems:   CAD (coronary artery disease)   Tobacco abuse   AKI (acute kidney injury) (HCC)   Elevated blood pressure reading   Hyperlipidemia  Chest Pain, CAD s/p CABG in 2004: Patient with retrosternal chest pressure concerning for unstable angina. Troponins have been negative and EKG without acute ischemic changes. He has significant risk factors including known CAD s/p CABG, tobacco use, HLD, tobacco use, and family history. Last cardiac cath was in 2008 for similar symptoms without obvious culprit lesion for his chest discomfort. He did have a negative treadmill exercise stress test in April 2016. Will need continued risk factor modification and optimization of medical management. TTE showed EF 55-60% with lipomatous hypertrophy of the atrial septum. Given his known vessel disease and risk factors, we will plan for cardiac catheterization tomorrow. Will need to evaluate grafts as it has been 13 years since his CABG. -Continue Atorvastatin 40 mg daily -Started on Carvedilol 3.125 mg BID -Asprin 81 mg daily -prn NTG -Plan for Cardiac catheterization tomorrow  AKI: Creatinine 1.34, no prior baseline. Receiving IVF NS @ 100 mL/hr. -f/u repeat BMET  HLD: LDL 138, Triglycerides 343, HDL 30. Was previously taking Lipitor, however he stopped this. -Started on Atorvastatin 40 mg daily  Tobacco use: Current smoker of 0.5 PPD, has tried Chantix which he did not tolerate. -Continue smoking cessation counseling  Elevated Blood Pressure: BP up to 182/87 on admission. No history of HTN. BP has now normalized.    Signed: Zada Finders, MD  [facility] PGY-2 07/15/2016, 11:32 AM  History and all data above reviewed.  Patient examined.  I agree with the findings as above.  Discussed with the patient.  He has had chest pain with known CAD and CABG.  He has severe on going risk  factors.  No objective evidence of ischemia.  However, pretest probability of obstructive CAD is high.   The patient exam reveals COR:RRR  ,  Lungs: Clear  ,  Abd: Positive bowel sounds,  no rebound no guarding, Ext No edema  .  All available labs, radiology testing, previous records reviewed. Agree with documented assessment and plan. Chest pain:  Pretest probability of obstructive CAD is high.  Plan cardiac cath.  The patient understands that risks included but are not limited to stroke (1 in 1000), death (1 in 21), kidney failure [usually temporary] (1 in 500), bleeding (1 in 200), allergic reaction [possibly serious] (1 in 200).  The patient understands and agrees to proceed.   Plan for tomorrow.  Will be first case   Minus Breeding  11:36 AM  07/15/2016

## 2016-07-17 LAB — BASIC METABOLIC PANEL
ANION GAP: 9 (ref 5–15)
BUN: 11 mg/dL (ref 6–20)
CHLORIDE: 104 mmol/L (ref 101–111)
CO2: 25 mmol/L (ref 22–32)
Calcium: 9.3 mg/dL (ref 8.9–10.3)
Creatinine, Ser: 1.15 mg/dL (ref 0.61–1.24)
GFR calc Af Amer: >60 mL/min (ref >60)
GLUCOSE: 132 mg/dL — AB (ref 65–99)
POTASSIUM: 4.1 mmol/L (ref 3.5–5.1)
SODIUM: 138 mmol/L (ref 135–145)

## 2016-07-17 LAB — CBC
HEMATOCRIT: 45.4 % (ref 39.0–52.0)
HEMOGLOBIN: 15.5 g/dL (ref 13.0–17.0)
MCH: 32 pg (ref 26.0–34.0)
MCHC: 34.1 g/dL (ref 30.0–36.0)
MCV: 93.6 fL (ref 78.0–100.0)
Platelets: 191 10*3/uL (ref 150–400)
RBC: 4.85 MIL/uL (ref 4.22–5.81)
RDW: 12.7 % (ref 11.5–15.5)
WBC: 12 10*3/uL — AB (ref 4.0–10.5)

## 2016-07-17 MED ORDER — ATORVASTATIN CALCIUM 40 MG PO TABS: 40.0000 mg | ORAL_TABLET | Freq: Every day | ORAL | 6 refills | Status: DC

## 2016-07-17 MED ORDER — ANGIOPLASTY BOOK
Freq: Once | Status: DC
  Filled 2016-07-17: qty 1

## 2016-07-17 MED ORDER — CARVEDILOL 3.125 MG PO TABS: 3.1250 mg | ORAL_TABLET | Freq: Two times a day (BID) | ORAL | 6 refills | Status: DC

## 2016-07-17 MED ORDER — NICOTINE 21 MG/24HR TD PT24: 21.0000 mg | MEDICATED_PATCH | Freq: Every day | TRANSDERMAL | 0 refills | Status: DC

## 2016-07-17 MED ORDER — LISINOPRIL 5 MG PO TABS
5.0000 mg | ORAL_TABLET | Freq: Every day | ORAL | Status: DC
  Administered 2016-07-17: 5 mg via ORAL
  Filled 2016-07-17: qty 1

## 2016-07-17 MED ORDER — TICAGRELOR 90 MG PO TABS: 90.0000 mg | ORAL_TABLET | Freq: Two times a day (BID) | ORAL | 11 refills | Status: DC

## 2016-07-17 MED ORDER — LISINOPRIL 5 MG PO TABS: 5.0000 mg | ORAL_TABLET | Freq: Every day | ORAL | 6 refills | Status: DC

## 2016-07-17 MED ORDER — NITROGLYCERIN 0.4 MG SL SUBL: 0.4000 mg | SUBLINGUAL_TABLET | SUBLINGUAL | 12 refills | Status: DC | PRN

## 2016-07-17 MED ORDER — HEART ATTACK BOUNCING BOOK
Freq: Once | Status: DC
  Filled 2016-07-17: qty 1

## 2016-07-17 NOTE — Discharge Summary (Signed)
Discharge Summary    Patient ID: Randall Hall,  MRN: [id number], DOB/AGE: [birth year] 61 y.o.  Admit date: 07/14/2016 Discharge date: 07/17/2016  Primary Care Provider: Mary-Margaret Hassell Done Primary Cardiologist: Dr. Percival Spanish  Discharge Diagnoses    Principal Problem:   Chest pain Active Problems:   CAD (coronary artery disease)   Tobacco abuse   AKI (acute kidney injury) ([place])   Elevated blood pressure reading   Hyperlipidemia   Coronary artery disease involving coronary bypass graft of native heart with unstable angina pectoris ([place])   Unstable angina (HCC)   Allergies No Known Allergies  Diagnostic Studies/Procedures    Echo 07/15/16 LV EF: 55% -   60%  ------------------------------------------------------------------- History:   PMH:   Chest pain.  PMH:   Myocardial infarction.  Risk factors:  Dyslipidemia.  ------------------------------------------------------------------- Study Conclusions  - Left ventricle: The cavity size was normal. Wall thickness was   increased in a pattern of mild LVH. Systolic function was normal.   The estimated ejection fraction was in the range of 55% to 60%.   Wall motion was normal; there were no regional wall motion   abnormalities. Left ventricular diastolic function parameters   were normal. - Left atrium: The atrium was moderately dilated. - Atrial septum: There was increased thickness of the septum,   consistent with lipomatous hypertrophy. No defect or patent   foramen ovale was identified.   Coronary Stent Intervention   07/16/16  Left Heart Cath and Cors/Grafts Angiography  Conclusion     Prox RCA lesion, 100 %stenosed.  Ost Cx to Prox Cx lesion, 50 %stenosed.  Prox Cx to Mid Cx lesion, 90 %stenosed.  Ost 2nd Mrg to 2nd Mrg lesion, 100 %stenosed.  SVG graft was visualized by angiography and is normal in caliber and anatomically normal.  Mid LAD lesion, 100 %stenosed.  Ost LAD to Prox LAD  lesion, 30 %stenosed.  LIMA graft was visualized by angiography and is normal in caliber and anatomically normal.  SVG graft was visualized by angiography.  Origin to Prox Graft lesion, 100 %stenosed.  SVG graft was visualized by angiography and is normal in caliber.  Mid Graft lesion, 30 %stenosed.  A STENT PROMUS PREM MR 3.5X16 drug eluting stent was successfully placed.  Dist Graft lesion, 90 %stenosed.  Post intervention, there is a 0% residual stenosis.   1. Severe triple vessel CAD s/p 4V CABG with 3/4 patent bypass grafts.  2. The mid LAD is occluded. The mid and distal LAD fills from the patent LIMA graft. The vein graft to the diagonal is occluded.  3. The OM branch is occluded and fills from the patent vein graft. The AV groove Circumflex has ostial 50% stenosis and diffuse mid 90% stenosis (unchanged from last cath).  4. The RCA is occluded at the ostium. The vein graft to the PDA is patent but there is a 90% stenosis in the distal body of the vein graft, TIMI-1 flow.  5. Successful PTCA/DES x 1 distal body of SVG to PDA.   Recommendations: DAPT for one year. He will be enrolled in the TWILIGHT study with ASA and Brilinta. Continue statin and beta blocker.        History of Present Illness     Randall Hall is a 60 y.o. male with a history of CAD s/p CABG in 2004, HLD, and tobacco use who presented to the ED 07/14/16 with chest pain. Patient was at his usual state of health yesterday morning. He went outside  to water his horses, fell asleep in a chair and was woken around 10 am by his wife. He stood up and began to feel lightheaded as if he were going to black out. He then started to feel retrosternal chest pressure/heaviness, 5-6/10 in intensity, without radiation to his arms, neck, jaw, or back. He had associated generalized weakness. His wife told him he looked pale. He took four baby Aspirin which temporarily relieved his pain. When the pain returned, he was brought to  the ED by his wife. 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 He has a chronic dry cough, but no SOB, fevers, chills, diaphoresis, nausea, vomiting, fall, syncope, or focal weakness.  In the ED, his BP was elevated to 182/87, thought secondary to pain. D-dimer was negative and Troponins are <0.03 times three. EKG showed NSR without acute ischemic changes. CXR did not show acute cardiopulmonary disease. His chest pain improved in the ED and he has not had further episodes during hospital stay.  Patient did have CABG in 2004 for severe three vessel disease performed by Dr. Cyndia Bent. He last had a LHC in 2008 which showed severe three-vessel native coronary disease with widely patent grafts and normal LV function (EF 60%). He underwent exercise treadmill stress testing on 12/01/2014 which was negative.  He is a current smoker of 0.5 PPD since age [age] when he met his wife (quit 3-4 years after CABG). He reports occasional alcohol use and marijuana use. He denies any cocaine or IV drug use. He lives in [place] with his wife. His father died at age 68 from an MI and his brother has CAD s/p stenting in his early 21s.  Hospital Course     Consultants:  IM as attending initially  TTE showed EF 55-60% with lipomatous hypertrophy of the atrial septum. Given his known vessel disease and risk factors,  plan for cardiac catheterization done to evaluate graft. Detailed cath report as above. PTCA/DES x 1 distal body of SVG to PDA.  Medical management for other disease. Ambulated without angina. He is enrolled in TWILIGHT study. Advised complete smoking cessation. BP is improving. Up-titrate as outpatient.   The patient has been seen by Dr. Percival Spanish today and deemed ready for discharge home. All follow-up appointments have been scheduled. Discharge medications are listed below.     Discharge Vitals Blood pressure (!) 158/92, pulse (!) 54, temperature 98.1 F (36.7 C), temperature source Oral, resp. rate 18, height 5' 6.5" (1.689 m), weight 253 lb (114.8 kg), SpO2 98 %.  Filed Weights   07/15/16 0500 07/16/16 0500 07/17/16 0500  Weight: 249 lb 14.4 oz (113.4 kg) 253 lb (114.8 kg) 253 lb (114.8 kg)    Labs & Radiologic Studies     CBC  Recent Labs  07/14/16 1654 07/17/16 0358  WBC 14.7* 12.0*  HGB 17.2* 15.5  HCT 48.7 45.4  MCV 93.3 93.6  PLT 224 540   Basic Metabolic Panel  Recent Labs  07/16/16 0431 07/17/16 0358  NA 138 138  K 4.2 4.1  CL 105 104  CO2 27 25  GLUCOSE 121* 132*  BUN 17 11  CREATININE 1.17 1.15  CALCIUM 8.5* 9.3   Liver Function Tests No results for input(s): AST, ALT, ALKPHOS, BILITOT, PROT, ALBUMIN in the last 72 hours. No results for input(s): LIPASE, AMYLASE in the last 72 hours.  Cardiac Enzymes  Recent Labs  07/14/16 2056 07/15/16 0136 07/15/16 0714  TROPONINI <0.03 <0.03 <0.03   BNP Invalid input(s): POCBNP D-Dimer  Recent Labs  07/14/16 1654  DDIMER 0.47   Hemoglobin A1C  Recent Labs  07/15/16 0136  HGBA1C 6.6*   Fasting Lipid Panel  Recent Labs  07/15/16 0136  CHOL 237*  HDL 30*  LDLCALC 138*  TRIG 343*  CHOLHDL 7.9   Thyroid Function Tests No results for input(s): TSH, T4TOTAL, T3FREE, THYROIDAB in the last 72 hours.  Invalid input(s): FREET3  Dg Chest 2 View  Result Date: 07/14/2016 CLINICAL DATA:  Chest pressure, generalized weakness and dizziness since 10 a.m. this morning. EXAM: CHEST  2 VIEW COMPARISON:  Single-view of the chest 09/20/2006 and 09/10/2010. FINDINGS: The patient is status post CABG. There is bronchitic change. No consolidative process, pneumothorax or effusion. No evidence of edema. Remote healed left clavicle fracture is noted. IMPRESSION: Chronic bronchitic change without acute disease. Electronically Signed   By: Inge Rise M.D.   On: 07/14/2016 17:29     Disposition   Pt is being discharged home today in good condition.  Follow-up Plans & Appointments    Follow-up Information    Minus Breeding, MD. Go on 08/07/2016.   Specialty:  Cardiology Why:  _0 :45 Contact information: [place] [id number] [phone]          Discharge Instructions    Amb Referral to Cardiac Rehabilitation    Complete by:  As directed    Diagnosis:   Coronary Stents PTCA     Diet - low sodium heart healthy    Complete by:  As directed    Discharge instructions    Complete by:  As directed    No driving for 48 hours. No lifting over 5 lbs for 1 week. No sexual activity for 1 week. You may return to work on 07/22/16. Keep procedure site clean & dry. If you notice increased pain, swelling, bleeding or pus, call/return!  You may shower, but no soaking baths/hot tubs/pools for 1 week.   Increase activity slowly    Complete by:  As directed       Discharge Medications   Current Discharge Medication List    START taking these medications   Details  atorvastatin (LIPITOR) 40 MG tablet Take 1 tablet (40 mg total) by mouth daily at 6 PM. Qty: 30 tablet, Refills: 6    carvedilol (COREG) 3.125 MG tablet Take 1 tablet (3.125 mg total) by mouth 2 (two) times daily with a meal. Qty: 60 tablet, Refills: 6    lisinopril (PRINIVIL,ZESTRIL) 5 MG tablet Take 1 tablet (5 mg total) by mouth daily. Qty: 30 tablet, Refills: 6    nicotine (NICODERM CQ - DOSED IN MG/24 HOURS) 21 mg/24hr patch Place 1 patch (21 mg total) onto the skin daily. Qty: 28 patch, Refills: 0    nitroGLYCERIN (NITROSTAT) 0.4 MG SL tablet Place 1 tablet (0.4 mg total) under the tongue every 5 (five) minutes as needed for chest pain. Qty: 25 tablet, Refills: 12    ticagrelor (BRILINTA) 90 MG TABS tablet Take 1 tablet (90 mg total) by mouth 2 (two) times daily. Qty: 60 tablet, Refills: 11      CONTINUE these medications which have NOT CHANGED   Details  aspirin 81  MG tablet Take 81 mg by mouth daily.      b complex vitamins tablet Take 1 tablet by mouth.  fish oil-omega-3 fatty acids 1000 MG capsule Take 1 capsule by mouth daily.     GARLIC PO Take 1 tablet by mouth daily.    VITAMIN E PO Take 1 capsule by mouth daily.    fluticasone (FLONASE) 50 MCG/ACT nasal spray Place 2 sprays into both nostrils daily. Qty: 16 g, Refills: 6          Outstanding Labs/Studies   BMET during follow up in December Consider OP f/u labs 6-8 weeks given statin initiation this admission.  Duration of Discharge Encounter   Greater than 30 minutes including physician time.  Jarrett Soho PA-C 07/17/2016, 12:03 PM  Patient seen and examined.  Plan as discussed in my rounding note for today and outlined above. Minus Breeding  07/17/2016  2:17 PM

## 2016-07-17 NOTE — Progress Notes (Signed)
Patient Name: [name] Date of Encounter: 07/17/2016  Primary Cardiologist: [facility]  Hospital Problem List     Principal Problem:   Chest pain Active Problems:   CAD (coronary artery disease)   Tobacco abuse   AKI (acute kidney injury) (HCC)   Elevated blood pressure reading   Hyperlipidemia   Coronary artery disease involving coronary bypass graft of native heart with unstable angina pectoris (HCC)   Unstable angina (HCC)     Subjective   No chest pain.  No SOB  Inpatient Medications    Scheduled Meds: . angioplasty book   Does not apply Once  . aspirin  81 mg Oral Daily  . atorvastatin  40 mg Oral q1800  . B-complex with vitamin C  1 tablet Oral Daily  . carvedilol  3.125 mg Oral BID WC  . nicotine  21 mg Transdermal Daily  . omega-3 acid ethyl esters  1 capsule Oral Daily  . sodium chloride flush  3 mL Intravenous Q12H  . ticagrelor  90 mg Oral BID  . vitamin e  100 Units Oral Daily   Continuous Infusions:  PRN Meds: sodium chloride, acetaminophen, albuterol, ALPRAZolam, dextromethorphan-guaiFENesin, morphine injection, nitroGLYCERIN, ondansetron (ZOFRAN) IV, sodium chloride flush, zolpidem   Vital Signs    Vitals:   07/17/16 0030 07/17/16 0400 07/17/16 0500 07/17/16 0755  BP: (!) 159/85 119/61  (!) 141/78  Pulse: (!) 58 (!) 57  60  Resp: 12 16  20   Temp:  97.8 F (36.6 C)  98.1 F (36.7 C)  TempSrc:  Oral  Oral  SpO2: 98% 96%  97%  Weight:   253 lb (114.8 kg)   Height:        Intake/Output Summary (Last 24 hours) at 07/17/16 0931 Last data filed at 07/17/16 0746  Gross per 24 hour  Intake              615 ml  Output             2050 ml  Net            -1435 ml   Filed Weights   07/15/16 0500 07/16/16 0500 07/17/16 0500  Weight: 249 lb 14.4 oz (113.4 kg) 253 lb (114.8 kg) 253 lb (114.8 kg)    Physical Exam    GEN: NAD.  Neck:  no JVD Cardiac:  Rate and Rhythm, no murmurs, rubs, or gallops.  No edema.  Radials/DP/PT 2+  and equal  bilaterally, left wrist OK. Respiratory:  Respirations  regular and unlabored, clear to auscultation bilaterally. GI: Soft, nontender, nondistended, BS + x 4. Skin: warm and dry, no rash. Neuro:   Strength and sensation are intact. Psych:  AAOx3.  Normal affect.  Labs    CBC  Recent Labs  07/14/16 1654 07/17/16 0358  WBC 14.7* 12.0*  HGB 17.2* 15.5  HCT 48.7 45.4  MCV 93.3 93.6  PLT 224 191   Basic Metabolic Panel  Recent Labs  07/16/16 0431 07/17/16 0358  NA 138 138  K 4.2 4.1  CL 105 104  CO2 27 25  GLUCOSE 121* 132*  BUN 17 11  CREATININE 1.17 1.15  CALCIUM 8.5* 9.3   Liver Function Tests No results for input(s): AST, ALT, ALKPHOS, BILITOT, PROT, ALBUMIN in the last 72 hours. No results for input(s): LIPASE, AMYLASE in the last 72 hours. Cardiac Enzymes  Recent Labs  07/14/16 2056 07/15/16 0136 07/15/16 0714  TROPONINI <0.03 <0.03 <0.03   BNP Invalid input(s): POCBNP  D-Dimer  Recent Labs  07/14/16 1654  DDIMER 0.47   Hemoglobin A1C  Recent Labs  07/15/16 0136  HGBA1C 6.6*   Fasting Lipid Panel  Recent Labs  07/15/16 0136  CHOL 237*  HDL 30*  LDLCALC 138*  TRIG 343*  CHOLHDL 7.9   Thyroid Function Tests No results for input(s): TSH, T4TOTAL, T3FREE, THYROIDAB in the last 72 hours.  Invalid input(s): FREET3    ECG    NA - Personally Reviewed  Radiology    No results found.  Cardiac Studies   Conclusion     Prox RCA lesion, 100 %stenosed.  Ost Cx to Prox Cx lesion, 50 %stenosed.  Prox Cx to Mid Cx lesion, 90 %stenosed.  Ost 2nd Mrg to 2nd Mrg lesion, 100 %stenosed.  SVG graft was visualized by angiography and is normal in caliber and anatomically normal.  Mid LAD lesion, 100 %stenosed.  Ost LAD to Prox LAD lesion, 30 %stenosed.  LIMA graft was visualized by angiography and is normal in caliber and anatomically normal.  SVG graft was visualized by angiography.  Origin to Prox Graft lesion, 100  %stenosed.  SVG graft was visualized by angiography and is normal in caliber.  Mid Graft lesion, 30 %stenosed.  A STENT PROMUS PREM MR 3.5X16 drug eluting stent was successfully placed.  Dist Graft lesion, 90 %stenosed.  Post intervention, there is a 0% residual stenosis.   1. Severe triple vessel CAD s/p 4V CABG with 3/4 patent bypass grafts.  2. The mid LAD is occluded. The mid and distal LAD fills from the patent LIMA graft. The vein graft to the diagonal is occluded.  3. The OM branch is occluded and fills from the patent vein graft. The AV groove Circumflex has ostial 50% stenosis and diffuse mid 90% stenosis (unchanged from last cath).  4. The RCA is occluded at the ostium. The vein graft to the PDA is patent but there is a 90% stenosis in the distal body of the vein graft, TIMI-1 flow.  5. Successful PTCA/DES x 1 distal body of SVG to PDA.   Recommendations: DAPT for one year. He will be enrolled in the TWILIGHT study with ASA and Brilinta. Continue statin and beta blocker.      Patient Profile      [name] is a 60 y.o. male with a history of CAD s/p CABG in 2004, HLD, and tobacco use who presented to the ED with chest pain  Assessment & Plan    CHEST PAIN:   Medical management of residual disease.  We had a long conversation about better secondary risk reduction.  Needs SLNTG at discharge.  We will arrange follow up.   TOBACCO ABUSE:  Educated.   HTN:  BP has been elevated.  Started Lisinopril.  He will need a BMET on follow up.  He will see me in Dec.    DYSLIPIDEMIA:  Now on statin.    Signed, Randall RotundaJames Eyvette Cordon, MD  07/17/2016, 9:31 AM

## 2016-07-17 NOTE — Progress Notes (Signed)
CARDIAC REHAB PHASE I   PRE:  Rate/Rhythm: 60 SR  BP:  Supine: 141/78  Sitting:   Standing:    SaO2:   MODE:  Ambulation: 800 ft   POST:  Rate/Rhythm: 90 SR  69 at rest  BP:  Supine:   Sitting: 150/86  Standing:    SaO2:  [id number] Pt walked 800 with steady gait. No CP. Stated he had a little burning in chest when he went to bathroom but none with this walk. No questions re ed yesterday. Re enforced brilinta. [place]erring to [place]   Luetta Nuttingharlene Shequilla Goodgame, RN BSN  07/17/2016 8:28 AM

## 2016-07-17 NOTE — Research (Signed)
TWILIGHT Informed Consent   Subject Name: Eron Staat  Subject met inclusion and exclusion criteria.  The informed consent form, study requirements and expectations were reviewed with the subject and questions and concerns were addressed prior to the signing of the consent form.  The subject verbalized understanding of the trail requirements.  The subject agreed to participate in the TWILIGHT trial and signed the informed consent.  The informed consent was obtained prior to performance of any protocol-specific procedures for the subject.  A copy of the signed informed consent was given to the subject and a copy was placed in the subject's medical record.  Mable Fill, Marissa Nestle 07/17/2016, 16:00

## 2016-07-17 NOTE — Progress Notes (Signed)
  BRILINTA  90 MG BID  30 /60 TAB   COVER- YES  CO-PAY- $ 80.00  TIER- 4 DRUG  PRIOR APPROVAL- NO  PHARMACY : [facility] , [facility] AND  [facility]

## 2016-07-22 ENCOUNTER — Other Ambulatory Visit: Payer: Self-pay | Admitting: *Deleted

## 2016-07-22 ENCOUNTER — Telehealth: Payer: Self-pay | Admitting: Cardiology

## 2016-07-22 MED ORDER — AMBULATORY NON FORMULARY MEDICATION: 81.0000 mg | Freq: Every day | Status: DC

## 2016-07-22 MED ORDER — AMBULATORY NON FORMULARY MEDICATION
90.0000 mg | Freq: Every day | Status: DC
Start: 2016-07-17 — End: 2017-11-19

## 2016-07-22 NOTE — Telephone Encounter (Signed)
Had cath last Tues, Wed morning this symptom started. Has been waiting over weekend for this to go away. Pt c/o burning sensation in chest on left side which only occurs when he moves or bends over. He states it does not occur if he places hand on left side of chest and splints before bending over.  Does note left radial cath entry - concerned that maybe some nerve irritation or something occurred w/ cath.  He notes symptom-wise, he actually "feels great" - notes he is better since his catheterization. Having none of the symptoms he had prior to cath - denies SOB, heavy chest pressure, etc.  Pt aware I will have provider review & I will call back. Pt thinks related to cath entry and I told him this sounds reasonable but that if he was having any worsening of his symptoms to go to ER.

## 2016-07-22 NOTE — Telephone Encounter (Signed)
Randall Hall is calling because he had a stent placed on last Tuesday and he is now having some sharp burning pains if he turns a certain way or if he goes to pick up something  . Please call   Thanks

## 2016-07-25 NOTE — Telephone Encounter (Signed)
Please call and ask if symptoms are still ongoing and if so he might need to see an APP.

## 2016-07-25 NOTE — Telephone Encounter (Signed)
Called, attempted to leave msg to call - VM box is full.

## 2016-07-29 NOTE — Telephone Encounter (Signed)
Unable to reach patient or leave message.

## 2016-08-07 ENCOUNTER — Ambulatory Visit: Payer: Self-pay | Admitting: Cardiology

## 2016-08-07 NOTE — Progress Notes (Signed)
Cardiology Office Note   Date:  08/08/2016   ID:  Randall Hall, DOB Jan 03, 1956, MRN [id number]  PCP:  Bennie Pierini, FNP  Cardiologist:   Rollene Rotunda, MD   No chief complaint on file.     History of Present Illness: Randall Hall is a 60 y.o. male who presents for follow-up of coronary disease.  He is status post DES to a vein graft as below.   Since his hospitalization is done well. He has been a little lightheaded and more fatigued but his heart rates been lower.  He stopped smoking!  The patient denies any new symptoms such as chest discomfort, neck or arm discomfort. There has been no new shortness of breath, PND or orthopnea. There have been no reported palpitations, presyncope or syncope.  Past Medical History:  Diagnosis Date  . CAD (coronary artery disease)   . History of myocardial infarction   . Hyperlipemia   . Tobacco abuse     Past Surgical History:  Procedure Laterality Date  . CARDIAC CATHETERIZATION N/A 07/16/2016   Procedure: Left Heart Cath and Cors/Grafts Angiography;  Surgeon: Kathleene Hazel, MD;  Location: [facility]VASIVE CV LAB;  Service: Cardiovascular;  Laterality: N/A;  . CARDIAC CATHETERIZATION N/A 07/16/2016   Procedure: Coronary Stent Intervention;  Surgeon: Kathleene Hazel, MD;  Location: [facility] INVASIVE CV LAB;  Service: Cardiovascular;  Laterality: N/A;  . CORONARY ARTERY BYPASS GRAFT     2004 Dr. Laneta Simmers LIMA to the LAD, SVG to PDA and posterior lateral, SVG to diagonal, RIMA to obtuse marginal.  Last catheterization 2008.     Current Outpatient Prescriptions  Medication Sig Dispense Refill  . AMBULATORY NON FORMULARY MEDICATION Take 90 mg by mouth daily. Medication Name: BRILINTA 90 mg BID (TWILIGHT Research Study PROVIDED)    . AMBULATORY NON FORMULARY MEDICATION Take 81 mg by mouth daily. Medication Name: Aspirin 81 mg Daily (TWILIGHT Research Study PROVIDED)    . atorvastatin (LIPITOR) 40 MG tablet Take 1 tablet (40 mg  total) by mouth daily at 6 PM. 30 tablet 6  . b complex vitamins tablet Take 1 tablet by mouth.      . carvedilol (COREG) 3.125 MG tablet Take 1 tablet (3.125 mg total) by mouth 2 (two) times daily with a meal. 60 tablet 6  . fish oil-omega-3 fatty acids 1000 MG capsule Take 1 capsule by mouth daily.     Marland Kitchen GARLIC PO Take 1 tablet by mouth daily.    Marland Kitchen lisinopril (PRINIVIL,ZESTRIL) 5 MG tablet Take 1 tablet (5 mg total) by mouth daily. 30 tablet 6  . nicotine (NICODERM CQ - DOSED IN MG/24 HOURS) 21 mg/24hr patch Place 1 patch (21 mg total) onto the skin daily. 28 patch 0  . nitroGLYCERIN (NITROSTAT) 0.4 MG SL tablet Place 1 tablet (0.4 mg total) under the tongue every 5 (five) minutes as needed for chest pain. 25 tablet 12  . VITAMIN E PO Take 1 capsule by mouth daily.     No current facility-administered medications for this visit.     Allergies:   Patient has no known allergies.    ROS:  Please see the history of present illness.   Otherwise, review of systems are positive for ED.   All other systems are reviewed and negative.    PHYSICAL EXAM: VS:  BP 102/60   Pulse (!) 52   Ht 5\' 6"  (1.676 m)   Wt 259 lb 9.6 oz (117.8 kg)   BMI 41.90 kg/m  ,  BMI Body mass index is 41.9 kg/m. GENERAL:  Well appearing HEENT:  Pupils equal round and reactive, fundi not visualized, oral mucosa unremarkable NECK:  No jugular venous distention, waveform within normal limits, carotid upstroke brisk and symmetric, no bruits, no thyromegaly LYMPHATICS:  No cervical, inguinal adenopathy LUNGS:  Clear to auscultation bilaterally BACK:  No CVA tenderness CHEST:  Unremarkable HEART:  PMI not displaced or sustained,S1 and S2 within normal limits, no S3, no S4, no clicks, no rubs, no murmurs ABD:  Flat, positive bowel sounds normal in frequency in pitch, no bruits, no rebound, no guarding, no midline pulsatile mass, no hepatomegaly, no splenomegaly, obese EXT:  2 plus pulses throughout, no edema, no cyanosis  no clubbing  CATH:       Prox RCA lesion, 100 %stenosed.  Ost Cx to Prox Cx lesion, 50 %stenosed.  Prox Cx to Mid Cx lesion, 90 %stenosed.  Ost 2nd Mrg to 2nd Mrg lesion, 100 %stenosed.  SVG graft was visualized by angiography and is normal in caliber and anatomically normal.  Mid LAD lesion, 100 %stenosed.  Ost LAD to Prox LAD lesion, 30 %stenosed.  LIMA graft was visualized by angiography and is normal in caliber and anatomically normal.  SVG graft was visualized by angiography.  Origin to Prox Graft lesion, 100 %stenosed.  SVG graft was visualized by angiography and is normal in caliber.  Mid Graft lesion, 30 %stenosed.  A STENT PROMUS PREM MR 3.5X16 drug eluting stent was successfully placed.  Dist Graft lesion, 90 %stenosed.  Post intervention, there is a 0% residual stenosis.   1. Severe triple vessel CAD s/p 4V CABG with 3/4 patent bypass grafts.  2. The mid LAD is occluded. The mid and distal LAD fills from the patent LIMA graft. The vein graft to the diagonal is occluded.  3. The OM branch is occluded and fills from the patent vein graft. The AV groove Circumflex has ostial 50% stenosis and diffuse mid 90% stenosis (unchanged from last cath).  4. The RCA is occluded at the ostium. The vein graft to the PDA is patent but there is a 90% stenosis in the distal body of the vein graft, TIMI-1 flow.  5. Successful PTCA/DES x 1 distal body of SVG to PDA.       EKG:  EKG is ordered today. The ekg ordered today demonstrates sinus rhythm, rate 52, axis within normal limits, intervals within normal limits, no acute ST-T wave changes.   Recent Labs: 07/17/2016: BUN 11; Creatinine, Ser 1.15; Hemoglobin 15.5; Platelets 191; Potassium 4.1; Sodium 138       Wt Readings from Last 3 Encounters:  08/08/16 259 lb 9.6 oz (117.8 kg)  07/17/16 253 lb (114.8 kg)  09/07/15 260 lb (117.9 kg)      Other studies Reviewed: Additional studies/ records that were reviewed  today include:  Hospital records Review of the above records demonstrates:  See elsewhere   ASSESSMENT AND PLAN:  CAD:  The patient has no new sypmtoms.  No further cardiovascular testing is indicated.  We will continue with aggressive risk reduction.  He is in the TWILIGHT study.   I will stop the Coreg because of his low HR and fatigue.  He will let me know if this continues.   DYSLIPIDEMIA:  He will continue the meds as listed and we will check a lipid level 10 weeks.   TOBACCO:  I congratulated him on stopping smoking.   OBESITY:  The patient understands the need to  lose weight with diet and exercise. We have discussed specific strategies for this.  Current medicines are reviewed at length with the patient today.  The patient does not have concerns regarding medicines.  The following changes have been made:  As above.  Labs/ tests ordered today include:  No orders of the defined types were placed in this encounter.    Disposition:   FU with me in six months.    Signed, Rollene RotundaJames Kloe Oates, MD  08/08/2016 10:55 AM    [facility]

## 2016-08-08 ENCOUNTER — Ambulatory Visit (INDEPENDENT_AMBULATORY_CARE_PROVIDER_SITE_OTHER): Payer: BLUE CROSS/BLUE SHIELD | Admitting: Cardiology

## 2016-08-08 ENCOUNTER — Encounter: Payer: Self-pay | Admitting: Cardiology

## 2016-08-08 VITALS — BP 102/60 | HR 52 | Ht 66.0 in | Wt 259.6 lb

## 2016-08-08 DIAGNOSIS — Z79899 Other long term (current) drug therapy: Secondary | ICD-10-CM

## 2016-08-08 DIAGNOSIS — E785 Hyperlipidemia, unspecified: Secondary | ICD-10-CM | POA: Diagnosis not present

## 2016-08-08 NOTE — Patient Instructions (Addendum)
Medication Instructions:  STOP- Cavedilol  Labwork: Fasting Lipid Liver in 10 weeks  Testing/Procedures: None Ordered  Follow-Up: Your physician wants you to follow-up in: 6 Months. You will receive a reminder letter in the mail two months in advance. If you don't receive a letter, please call our office to schedule the follow-up appointment.   Any Other Special Instructions Will Be Listed Below (If Applicable).   If you need a refill on your cardiac medications before your next appointment, please call your pharmacy.

## 2016-08-13 ENCOUNTER — Encounter: Payer: Self-pay | Admitting: *Deleted

## 2016-08-13 DIAGNOSIS — Z006 Encounter for examination for normal comparison and control in clinical research program: Secondary | ICD-10-CM

## 2016-08-13 NOTE — Progress Notes (Signed)
TWILIGHT Research study month 1 telephone visit completed. Patient states he has had 2 nose bleeds that last less than 5 minutes. First nose bleed occurred on the day he was discharged from hospital 21/NOV/2017 and the other was 06/Dec/17. He did not seek medical attention for either events. He states he has been compliant with research provided medication. Next research visit is due no later than 05/MAR/2017. At that visit he will be randomized to ASSA 81 mg daily or PLACEBO. Questions encouraged and answered.

## 2016-08-29 ENCOUNTER — Encounter: Payer: Self-pay | Admitting: Nurse Practitioner

## 2016-09-03 ENCOUNTER — Encounter: Payer: Self-pay | Admitting: Nurse Practitioner

## 2016-10-21 ENCOUNTER — Encounter: Payer: Self-pay | Admitting: *Deleted

## 2016-10-21 DIAGNOSIS — Z006 Encounter for examination for normal comparison and control in clinical research program: Secondary | ICD-10-CM

## 2016-10-21 MED ORDER — AMBULATORY NON FORMULARY MEDICATION: 81.0000 mg | Freq: Every day | Status: DC

## 2016-10-21 NOTE — Progress Notes (Signed)
[name] came in for his 3 month TWILIGHT visit. He states the nose bleeds are better. He has not had one in over a month and last for less then 5 minutes. He states he has been compliant with his study drug but has missed an occasional evening dose. I stressed to him the importance of taking his medication as instructed. I proceeded with randomization. [name] was dispensed two bottles of tricagrelor and one bottle of aspirin/placebo. We will follow up in one month by telephone.

## 2016-11-20 ENCOUNTER — Telehealth: Payer: Self-pay | Admitting: *Deleted

## 2016-11-20 ENCOUNTER — Encounter: Payer: Self-pay | Admitting: *Deleted

## 2016-11-20 DIAGNOSIS — Z006 Encounter for examination for normal comparison and control in clinical research program: Secondary | ICD-10-CM

## 2016-11-20 NOTE — Progress Notes (Signed)
TWILIGHT Research study month 4 telephone follow up completed. Patient denies any bleeding or other adverse events. He states he has been compliant with study provided medication. Next research required visit is due no later than 17/SEP/2018. Questions encouraged and answered.

## 2016-11-20 NOTE — Telephone Encounter (Signed)
Left message for patient to call Research office for his month 4 telephone follow up visit.

## 2016-12-28 ENCOUNTER — Emergency Department (HOSPITAL_COMMUNITY)
Admission: EM | Admit: 2016-12-28 | Discharge: 2016-12-29 | Disposition: A | Discharge status: Home / Self Care | Payer: Self-pay | Attending: Emergency Medicine | Admitting: Emergency Medicine

## 2016-12-28 ENCOUNTER — Encounter (HOSPITAL_COMMUNITY): Payer: Self-pay

## 2016-12-28 ENCOUNTER — Emergency Department (HOSPITAL_COMMUNITY): Payer: Self-pay

## 2016-12-28 DIAGNOSIS — F17210 Nicotine dependence, cigarettes, uncomplicated: Secondary | ICD-10-CM | POA: Insufficient documentation

## 2016-12-28 DIAGNOSIS — Y939 Activity, unspecified: Secondary | ICD-10-CM | POA: Insufficient documentation

## 2016-12-28 DIAGNOSIS — Y929 Unspecified place or not applicable: Secondary | ICD-10-CM | POA: Insufficient documentation

## 2016-12-28 DIAGNOSIS — Y999 Unspecified external cause status: Secondary | ICD-10-CM | POA: Insufficient documentation

## 2016-12-28 DIAGNOSIS — Z951 Presence of aortocoronary bypass graft: Secondary | ICD-10-CM | POA: Insufficient documentation

## 2016-12-28 DIAGNOSIS — S0990XA Unspecified injury of head, initial encounter: Secondary | ICD-10-CM | POA: Insufficient documentation

## 2016-12-28 DIAGNOSIS — R55 Syncope and collapse: Secondary | ICD-10-CM | POA: Insufficient documentation

## 2016-12-28 DIAGNOSIS — I2510 Atherosclerotic heart disease of native coronary artery without angina pectoris: Secondary | ICD-10-CM | POA: Insufficient documentation

## 2016-12-28 DIAGNOSIS — Z79899 Other long term (current) drug therapy: Secondary | ICD-10-CM | POA: Insufficient documentation

## 2016-12-28 DIAGNOSIS — I252 Old myocardial infarction: Secondary | ICD-10-CM | POA: Insufficient documentation

## 2016-12-28 DIAGNOSIS — W19XXXA Unspecified fall, initial encounter: Secondary | ICD-10-CM

## 2016-12-28 LAB — CBC
HEMATOCRIT: 45.9 % (ref 39.0–52.0)
Hemoglobin: 15.4 g/dL (ref 13.0–17.0)
MCH: 32.1 pg (ref 26.0–34.0)
MCHC: 33.6 g/dL (ref 30.0–36.0)
MCV: 95.6 fL (ref 78.0–100.0)
Platelets: 210 10*3/uL (ref 150–400)
RBC: 4.8 MIL/uL (ref 4.22–5.81)
RDW: 13.2 % (ref 11.5–15.5)
WBC: 20.5 10*3/uL — AB (ref 4.0–10.5)

## 2016-12-28 LAB — BASIC METABOLIC PANEL
Anion gap: 10 (ref 5–15)
BUN: 15 mg/dL (ref 6–20)
CHLORIDE: 105 mmol/L (ref 101–111)
CO2: 23 mmol/L (ref 22–32)
CREATININE: 1.42 mg/dL — AB (ref 0.61–1.24)
Calcium: 9.3 mg/dL (ref 8.9–10.3)
GFR calc Af Amer: >60 mL/min (ref >60)
GFR calc non Af Amer: 52 mL/min — ABNORMAL LOW (ref >60)
Glucose, Bld: 138 mg/dL — ABNORMAL HIGH (ref 65–99)
Potassium: 4.4 mmol/L (ref 3.5–5.1)
SODIUM: 138 mmol/L (ref 135–145)

## 2016-12-28 LAB — I-STAT TROPONIN, ED: Troponin i, poc: 0.02 ng/mL (ref 0.00–0.08)

## 2016-12-28 MED ORDER — HYDROCODONE-ACETAMINOPHEN 5-325 MG PO TABS
2.0000 | ORAL_TABLET | Freq: Once | ORAL | Status: AC
  Administered 2016-12-28: 2 via ORAL
  Filled 2016-12-28: qty 2

## 2016-12-28 NOTE — ED Triage Notes (Signed)
Pt brought from home where he was riding a horse and fell about 5-886ft and doesn't know if he hit his head. Then one hour later pt passed out and fell face first and hit head on some hard plastic.

## 2016-12-28 NOTE — ED Provider Notes (Signed)
[facility]-EMERGENCY DEPT Provider Note   CSN: [id number] Arrival date & time: 12/28/16  4098     History   Chief Complaint Chief Complaint  Patient presents with  . Fall    HPI Randall Hall is a 61 y.o. male presenting via EMS after a syncopal episode. Patient reports that he was riding a horse and fell off 5-6 feet sliding to the right as the horse went left from his hip and rolled the episode was witnessed by his son. His foot was completely released from the [place] and he was not dragged. He states that he remembers everything from the event and walked away to go sit down on the bench for a while. He then suddenly syncopized and fell on his face on the piece of plastic and sustained a laceration to his right cheek. He states that he felt it coming with a sickness to his stomach just prior to passing out. He also endorses EtOH use just prior to riding. Patient is currently on anticoagulant status post stent in November 2017. Significant cardiac history. He reports feeling sore in his right buttock proximal but no difficulty walking numbness, tingling or deficits. Reports feeling well at this time except for little soreness in the right buttocks. No headache, dizziness, visual disturbances, weakness, nausea, or any other symptoms at this time.  HPI  Past Medical History:  Diagnosis Date  . CAD (coronary artery disease)   . History of myocardial infarction   . Hyperlipemia   . Tobacco abuse     Patient Active Problem List   Diagnosis Date Noted  . Unstable angina (HCC) 07/16/2016  . Coronary artery disease involving coronary bypass graft of native heart with unstable angina pectoris (HCC)   . Chest pain 07/14/2016  . AKI (acute kidney injury) (HCC) 07/14/2016  . Elevated blood pressure reading 07/14/2016  . CAD (coronary artery disease)   . Tobacco abuse   . Hyperlipidemia   . MYOCARDIAL INFARCTION, HX OF 09/10/2010  . STRAIN, CHEST WALL 09/10/2010    Past Surgical History:    Procedure Laterality Date  . CARDIAC CATHETERIZATION N/A 07/16/2016   Procedure: Left Heart Cath and Cors/Grafts Angiography;  Surgeon: Kathleene Hazel, MD;  Location: [facility] INVASIVE CV LAB;  Service: Cardiovascular;  Laterality: N/A;  . CARDIAC CATHETERIZATION N/A 07/16/2016   Procedure: Coronary Stent Intervention;  Surgeon: Kathleene Hazel, MD;  Location: [facility] INVASIVE CV LAB;  Service: Cardiovascular;  Laterality: N/A;  . CORONARY ARTERY BYPASS GRAFT     2004 Dr. Laneta Simmers LIMA to the LAD, SVG to PDA and posterior lateral, SVG to diagonal, RIMA to obtuse marginal.  Last catheterization 2008.       Home Medications    Prior to Admission medications   Medication Sig Start Date End Date Taking? Authorizing Provider  AMBULATORY NON FORMULARY MEDICATION Take 90 mg by mouth daily. Medication Name: BRILINTA 90 mg BID (TWILIGHT Research Study PROVIDED) 07/17/16  Yes Kathleene Hazel, MD  AMBULATORY NON FORMULARY MEDICATION Take 81 mg by mouth daily. Medication Name: Aspirin or placebo (TWILIGHT study drug) 10/21/16  Yes Kathleene Hazel, MD  atorvastatin (LIPITOR) 40 MG tablet Take 1 tablet (40 mg total) by mouth daily at 6 PM. 07/17/16  Yes Bhagat, Bhavinkumar, PA  b complex vitamins tablet Take 1 tablet by mouth.     Yes [provider]  fish oil-omega-3 fatty acids 1000 MG capsule Take 1 capsule by mouth daily.    Yes [provider]  GARLIC PO  Take 1 tablet by mouth daily.   Yes [provider]  lisinopril (PRINIVIL,ZESTRIL) 5 MG tablet Take 1 tablet (5 mg total) by mouth daily. 07/17/16  Yes Bhagat, Bhavinkumar, PA  nitroGLYCERIN (NITROSTAT) 0.4 MG SL tablet Place 1 tablet (0.4 mg total) under the tongue every 5 (five) minutes as needed for chest pain. 07/17/16  Yes Bhagat, Bhavinkumar, PA  VITAMIN E PO Take 1 capsule by mouth daily.   Yes [provider]  methocarbamol (ROBAXIN) 500 MG tablet Take 1 tablet (500 mg total) by mouth at  bedtime as needed for muscle spasms. 12/29/16   Georgiana Shore, PA-C  nicotine (NICODERM CQ - DOSED IN MG/24 HOURS) 21 mg/24hr patch Place 1 patch (21 mg total) onto the skin daily. Patient not taking: Reported on 12/28/2016 07/18/16   Manson Passey, PA    Family History Family History  Problem Relation Age of Onset  . Diabetes Mother   . CAD Father 65    Died  . CAD Brother     Social History Social History  Substance Use Topics  . Smoking status: Current Every Day Smoker    Packs/day: 0.50    Years: 21.00    Types: Cigarettes  . Smokeless tobacco: Never Used  . Alcohol use 0.0 oz/week     Allergies   Patient has no known allergies.   Review of Systems Review of Systems  Constitutional: Negative for chills and fever.  HENT: Negative for dental problem, ear discharge, ear pain, mouth sores, sinus pain and trouble swallowing.   Eyes: Negative for pain and visual disturbance.  Respiratory: Negative for cough, choking, chest tightness, shortness of breath, wheezing and stridor.   Cardiovascular: Negative for chest pain and palpitations.  Gastrointestinal: Negative for abdominal distention, abdominal pain, nausea and vomiting.  Genitourinary: Negative for flank pain and hematuria.  Musculoskeletal: Positive for myalgias. Negative for back pain, gait problem, joint swelling, neck pain and neck stiffness.  Skin: Positive for wound. Negative for color change, pallor and rash.  Neurological: Positive for syncope. Negative for dizziness, tremors, seizures, facial asymmetry, speech difficulty, weakness, light-headedness, numbness and headaches.     Physical Exam Updated Vital Signs BP 121/72 (BP Location: Left Arm)   Pulse 65   Resp 18   Ht 5\' 6"  (1.676 m)   Wt 114.8 kg   SpO2 96%   BMI 40.84 kg/m   Physical Exam  Constitutional: He is oriented to person, place, and time. He appears well-developed and well-nourished. No distress.   nontoxic-appearing, lying  comfortably in bed in no acute distress.  HENT:  Head: Normocephalic and atraumatic.  Right Ear: External ear normal.  Left Ear: External ear normal.  Mouth/Throat: Oropharynx is clear and moist. No oropharyngeal exudate.  Eyes: Conjunctivae and EOM are normal. Pupils are equal, round, and reactive to light. Right eye exhibits no discharge. Left eye exhibits no discharge.  Medial subconjunctival hemorrhage in the right eye  Neck: Normal range of motion. Neck supple.  Cardiovascular: Normal rate, regular rhythm, normal heart sounds and intact distal pulses.   No murmur heard. Pulmonary/Chest: Effort normal and breath sounds normal. No respiratory distress. He has no rales. He exhibits no tenderness.  Abdominal: Soft. He exhibits no distension. There is no tenderness.  Musculoskeletal: Normal range of motion. He exhibits tenderness. He exhibits no edema or deformity.  Full range of motion of hips bilaterally tenderness of the right gluteal muscle no bony tenderness. No midline tenderness of entire spine  Neurological: He  is alert and oriented to person, place, and time. No cranial nerve deficit or sensory deficit. He exhibits normal muscle tone. Coordination normal.  Neurologic Exam:  - Mental status: Patient is alert and cooperative. Fluent speech and words are clear. Coherent thought processes and insight is good. Patient is oriented x 4 to person, place, time and event.  - Cranial nerves:  CN III, IV, VI: pupils equally round, reactive to light both direct and conscensual and normal accommodation. Full extra-ocular movement. CN V: motor temporalis and masseter strength intact. CN VII : muscles of facial expression intact. CN X :  midline uvula. XI strength of sternocleidomastoid and trapezius muscles 5/5, XII: tongue is midline when protruded.  - Motor: No involuntary movements. Muscle tone and bulk normal throughout. Muscle strength is 5/5 in bilateral shoulder abduction, elbow flexion and  extension, thumb opposition, grip, hip extension, flexion, leg flexion and extension, ankle dorsiflexion and plantar flexion.  - Sensory:  light tough sensation intact in all extremities.  - Cerebellar: rapid alternating movements and point to point movement intact in upper and lower extremities. Normal stance and gait.   Skin: Skin is warm and dry. No rash noted. He is not diaphoretic. No pallor.  Psychiatric: He has a normal mood and affect.  Nursing note and vitals reviewed.    ED Treatments / Results  Labs (all labs ordered are listed, but only abnormal results are displayed) Labs Reviewed  BASIC METABOLIC PANEL - Abnormal; Notable for the following:       Result Value   Glucose, Bld 138 (*)    Creatinine, Ser 1.42 (*)    GFR calc non Af Amer 52 (*)    All other components within normal limits  CBC - Abnormal; Notable for the following:    WBC 20.5 (*)    All other components within normal limits  URINALYSIS, ROUTINE W REFLEX MICROSCOPIC  I-STAT TROPOININ, ED  I-STAT TROPOININ, ED  I-STAT TROPOININ, ED  CBG MONITORING, ED    EKG  EKG Interpretation  Date/Time:  Saturday Dec 28 2016 18:45:03 EDT Ventricular Rate:  82 PR Interval:    QRS Duration: 96 QT Interval:  378 QTC Calculation: 442 R Axis:   72 Text Interpretation:  Sinus rhythm Inferior infarct, age indeterminate since last tracing no significant change Confirmed by Mancel BaleWentz, Elliott [phone]([id number]) on 12/28/2016 11:29:33 PM       Radiology Ct Head Wo Contrast  Result Date: 12/28/2016 CLINICAL DATA:  61 year old male with syncope, fall and head injury. Initial encounter. EXAM: CT HEAD WITHOUT CONTRAST TECHNIQUE: Contiguous axial images were obtained from the base of the skull through the vertex without intravenous contrast. COMPARISON:  None. FINDINGS: Brain: No evidence of acute infarction, hemorrhage, hydrocephalus, extra-axial collection or mass lesion/mass effect. Mild chronic small-vessel white matter ischemic changes  are noted. Vascular: Intracranial atherosclerotic calcifications identified. Skull: Normal. Negative for fracture or focal lesion. Sinuses/Orbits: No acute finding. Other: None. IMPRESSION: No evidence of acute intracranial abnormality. Mild chronic small-vessel white matter ischemic changes. Electronically Signed   By: Harmon PierJeffrey  Hu M.D.   On: 12/28/2016 20:24    Procedures Procedures (including critical care time)  Medications Ordered in ED Medications  HYDROcodone-acetaminophen (NORCO/VICODIN) 5-325 MG per tablet 2 tablet (2 tablets Oral Given 12/28/16 2333)     Initial Impression / Assessment and Plan / ED Course  I have reviewed the triage vital signs and the nursing notes.  Pertinent labs & imaging results that were available during my care of the  patient were reviewed by me and considered in my medical decision making (see chart for details).    Patient presenting after syncopal episode one hour after a fall from a horse without loss of consciousness or head trauma. He did sustain trauma to the right side of his face from syncopal episode falling straight on the piece of plastic an hour later. He has discomfort in the right gluteal muscle with tenderness to palpation. Has full range of motion of the hip and has been ambulating without any problems. No bony tenderness. Patient refused x-ray.  CT head negative 11:00 pm informed patient of systems down and awaiting lab results. He reported worsening discomfort of his right buttocks and requested something for pain. Patient's pain was managed. On reassessment he reported significant improvement.  White blood count elevated with no concern for an infectious process at this time, patient is afebrile, nontoxic-appearing with no other complaints. No history of malignancy, cough, dysuria, or other symptoms.  Patient was advised to drink plenty of fluids and keep his urine clear. Delta troponin negative.  Discharge home with symptomatic relief  and close follow-up with PCP. He was stable, well-appearing and ready to go home.  Discussed strict return precautions and advised to return to the emergency department if experiencing any new or worsening symptoms. Instructions were understood and patient agreed with discharge plan.  Final Clinical Impressions(s) / ED Diagnoses   Final diagnoses:  Fall, initial encounter  Syncope, unspecified syncope type    New Prescriptions New Prescriptions   METHOCARBAMOL (ROBAXIN) 500 MG TABLET    Take 1 tablet (500 mg total) by mouth at bedtime as needed for muscle spasms.     Georgiana Shore, PA-C 12/29/16 Margarito Courser    Mancel Bale, MD 12/29/16 1236

## 2016-12-29 LAB — I-STAT TROPONIN, ED: Troponin i, poc: 0.02 ng/mL (ref 0.00–0.08)

## 2016-12-29 MED ORDER — METHOCARBAMOL 500 MG PO TABS: 500.0000 mg | ORAL_TABLET | Freq: Every evening | ORAL | 0 refills | Status: DC | PRN

## 2016-12-29 NOTE — Discharge Instructions (Addendum)
As discussed, As discussed, you may experience muscle spasm and pain in your neck and back in the next couple days following a fall. The medicine prescribed can help with muscle spasm but cannot betaken if driving, with alcohol or operating machinery. Follow up with your primary care provider. Drink plenty of fluids to keep your urine clear. Return to the emergency department immediately if you experience, chest pain, bad headache, dizziness, nausea, vomiting, confusion, shortness of breath, numbness in your extremities or any other new concerning symptoms in the meantime.

## 2017-03-22 ENCOUNTER — Inpatient Hospital Stay (HOSPITAL_COMMUNITY)
Admission: EM | Admit: 2017-03-22 | Discharge: 2017-03-23 | DRG: 563 | Disposition: A | Discharge status: Home / Self Care | Payer: Self-pay | Attending: General Surgery | Admitting: General Surgery

## 2017-03-22 ENCOUNTER — Observation Stay (HOSPITAL_COMMUNITY): Payer: Self-pay

## 2017-03-22 ENCOUNTER — Emergency Department (HOSPITAL_COMMUNITY): Payer: Self-pay

## 2017-03-22 ENCOUNTER — Encounter (HOSPITAL_COMMUNITY): Payer: Self-pay | Admitting: Emergency Medicine

## 2017-03-22 DIAGNOSIS — I700 Atherosclerosis of aorta: Secondary | ICD-10-CM | POA: Diagnosis present

## 2017-03-22 DIAGNOSIS — T80818A Extravasation of other vesicant agent, initial encounter: Secondary | ICD-10-CM | POA: Diagnosis present

## 2017-03-22 DIAGNOSIS — S42031A Displaced fracture of lateral end of right clavicle, initial encounter for closed fracture: Principal | ICD-10-CM

## 2017-03-22 DIAGNOSIS — F17210 Nicotine dependence, cigarettes, uncomplicated: Secondary | ICD-10-CM | POA: Diagnosis present

## 2017-03-22 DIAGNOSIS — M79672 Pain in left foot: Secondary | ICD-10-CM | POA: Diagnosis present

## 2017-03-22 DIAGNOSIS — Z7982 Long term (current) use of aspirin: Secondary | ICD-10-CM

## 2017-03-22 DIAGNOSIS — Z951 Presence of aortocoronary bypass graft: Secondary | ICD-10-CM

## 2017-03-22 DIAGNOSIS — I252 Old myocardial infarction: Secondary | ICD-10-CM

## 2017-03-22 DIAGNOSIS — Z955 Presence of coronary angioplasty implant and graft: Secondary | ICD-10-CM

## 2017-03-22 DIAGNOSIS — Z952 Presence of prosthetic heart valve: Secondary | ICD-10-CM

## 2017-03-22 DIAGNOSIS — T148XXA Other injury of unspecified body region, initial encounter: Secondary | ICD-10-CM

## 2017-03-22 DIAGNOSIS — S301XXA Contusion of abdominal wall, initial encounter: Secondary | ICD-10-CM | POA: Diagnosis present

## 2017-03-22 HISTORY — DX: Displaced fracture of lateral end of right clavicle, initial encounter for closed fracture: S42.031A

## 2017-03-22 HISTORY — DX: Acute myocardial infarction, unspecified: I21.9

## 2017-03-22 LAB — CBC
HCT: 44.4 % (ref 39.0–52.0)
HCT: 41.9 % (ref 39.0–52.0)
HEMATOCRIT: 38.7 % — AB (ref 39.0–52.0)
Hemoglobin: 15.1 g/dL (ref 13.0–17.0)
Hemoglobin: 13 g/dL (ref 13.0–17.0)
Hemoglobin: 14 g/dL (ref 13.0–17.0)
MCH: 32.6 pg (ref 26.0–34.0)
MCH: 32 pg (ref 26.0–34.0)
MCH: 32 pg (ref 26.0–34.0)
MCHC: 34 g/dL (ref 30.0–36.0)
MCHC: 33.6 g/dL (ref 30.0–36.0)
MCHC: 33.4 g/dL (ref 30.0–36.0)
MCV: 95.9 fL (ref 78.0–100.0)
MCV: 95.3 fL (ref 78.0–100.0)
MCV: 95.7 fL (ref 78.0–100.0)
PLATELETS: 213 10*3/uL (ref 150–400)
PLATELETS: 275 10*3/uL (ref 150–400)
Platelets: 262 10*3/uL (ref 150–400)
RBC: 4.63 MIL/uL (ref 4.22–5.81)
RBC: 4.06 MIL/uL — ABNORMAL LOW (ref 4.22–5.81)
RBC: 4.38 MIL/uL (ref 4.22–5.81)
RDW: 13.5 % (ref 11.5–15.5)
RDW: 13.5 % (ref 11.5–15.5)
RDW: 13.4 % (ref 11.5–15.5)
WBC: 18.9 10*3/uL — ABNORMAL HIGH (ref 4.0–10.5)
WBC: 20.9 10*3/uL — ABNORMAL HIGH (ref 4.0–10.5)
WBC: 17 10*3/uL — ABNORMAL HIGH (ref 4.0–10.5)

## 2017-03-22 LAB — URINALYSIS, ROUTINE W REFLEX MICROSCOPIC
BILIRUBIN URINE: NEGATIVE
Bacteria, UA: NONE SEEN
Glucose, UA: NEGATIVE mg/dL
HGB URINE DIPSTICK: NEGATIVE
Ketones, ur: NEGATIVE mg/dL
NITRITE: NEGATIVE
PH: 5 (ref 5.0–8.0)
Protein, ur: NEGATIVE mg/dL
SPECIFIC GRAVITY, URINE: 1.041 — AB (ref 1.005–1.030)

## 2017-03-22 LAB — PREPARE FRESH FROZEN PLASMA
UNIT DIVISION: 0
Unit division: 0

## 2017-03-22 LAB — TYPE AND SCREEN
ABO/RH(D): O NEG
Antibody Screen: NEGATIVE
UNIT DIVISION: 0
Unit division: 0

## 2017-03-22 LAB — COMPREHENSIVE METABOLIC PANEL
ALBUMIN: 3.7 g/dL (ref 3.5–5.0)
ALT: 26 U/L (ref 17–63)
AST: 27 U/L (ref 15–41)
Alkaline Phosphatase: 60 U/L (ref 38–126)
Anion gap: 9 (ref 5–15)
BUN: 15 mg/dL (ref 6–20)
CHLORIDE: 104 mmol/L (ref 101–111)
CO2: 22 mmol/L (ref 22–32)
CREATININE: 1.39 mg/dL — AB (ref 0.61–1.24)
Calcium: 8.7 mg/dL — ABNORMAL LOW (ref 8.9–10.3)
GFR calc Af Amer: >60 mL/min (ref >60)
GFR calc non Af Amer: 53 mL/min — ABNORMAL LOW (ref >60)
Glucose, Bld: 143 mg/dL — ABNORMAL HIGH (ref 65–99)
POTASSIUM: 4.3 mmol/L (ref 3.5–5.1)
SODIUM: 135 mmol/L (ref 135–145)
Total Bilirubin: 0.4 mg/dL (ref 0.3–1.2)
Total Protein: 6.4 g/dL — ABNORMAL LOW (ref 6.5–8.1)

## 2017-03-22 LAB — BPAM RBC
BLOOD PRODUCT EXPIRATION DATE: 201807312359
BLOOD PRODUCT EXPIRATION DATE: 201807312359
ISSUE DATE / TIME: 201807280748
ISSUE DATE / TIME: 201807280748
UNIT TYPE AND RH: 9500
Unit Type and Rh: 9500

## 2017-03-22 LAB — I-STAT CHEM 8, ED
BUN: 22 mg/dL — ABNORMAL HIGH (ref 6–20)
CREATININE: 1.5 mg/dL — AB (ref 0.61–1.24)
Calcium, Ion: 1.01 mmol/L — ABNORMAL LOW (ref 1.15–1.40)
Chloride: 105 mmol/L (ref 101–111)
Glucose, Bld: 148 mg/dL — ABNORMAL HIGH (ref 65–99)
HEMATOCRIT: 46 % (ref 39.0–52.0)
HEMOGLOBIN: 15.6 g/dL (ref 13.0–17.0)
POTASSIUM: 5 mmol/L (ref 3.5–5.1)
Sodium: 137 mmol/L (ref 135–145)
TCO2: 24 mmol/L (ref 0–100)

## 2017-03-22 LAB — BPAM FFP
BLOOD PRODUCT EXPIRATION DATE: 201807282359
Blood Product Expiration Date: 201807282359
ISSUE DATE / TIME: 201807280002
ISSUE DATE / TIME: 201807280002
UNIT TYPE AND RH: 600
Unit Type and Rh: 6200

## 2017-03-22 LAB — BASIC METABOLIC PANEL
Anion gap: 9 (ref 5–15)
BUN: 12 mg/dL (ref 6–20)
CHLORIDE: 103 mmol/L (ref 101–111)
CO2: 26 mmol/L (ref 22–32)
CREATININE: 1.26 mg/dL — AB (ref 0.61–1.24)
Calcium: 8.5 mg/dL — ABNORMAL LOW (ref 8.9–10.3)
GFR calc Af Amer: >60 mL/min (ref >60)
GFR, EST NON AFRICAN AMERICAN: 60 mL/min — AB (ref >60)
GLUCOSE: 152 mg/dL — AB (ref 65–99)
POTASSIUM: 4.5 mmol/L (ref 3.5–5.1)
Sodium: 138 mmol/L (ref 135–145)

## 2017-03-22 LAB — PROTIME-INR
INR: 0.91
Prothrombin Time: 12.3 seconds (ref 11.4–15.2)

## 2017-03-22 LAB — ETHANOL: ALCOHOL ETHYL (B): 179 mg/dL — AB (ref <5)

## 2017-03-22 LAB — I-STAT CG4 LACTIC ACID, ED: Lactic Acid, Venous: 2.24 mmol/L (ref 0.5–1.9)

## 2017-03-22 LAB — CDS SEROLOGY

## 2017-03-22 LAB — HIV ANTIBODY (ROUTINE TESTING W REFLEX): HIV Screen 4th Generation wRfx: NONREACTIVE

## 2017-03-22 LAB — ABO/RH: ABO/RH(D): O NEG

## 2017-03-22 LAB — MRSA PCR SCREENING: MRSA by PCR: NEGATIVE

## 2017-03-22 MED ORDER — OXYCODONE HCL 5 MG PO TABS
5.0000 mg | ORAL_TABLET | ORAL | Status: DC | PRN
  Administered 2017-03-22 – 2017-03-23 (×2): 5 mg via ORAL
  Filled 2017-03-22 (×2): qty 1

## 2017-03-22 MED ORDER — ONDANSETRON 4 MG PO TBDP: 4.0000 mg | ORAL_TABLET | Freq: Four times a day (QID) | ORAL | Status: DC | PRN

## 2017-03-22 MED ORDER — ONDANSETRON HCL 4 MG/2ML IJ SOLN: 4.0000 mg | Freq: Four times a day (QID) | INTRAMUSCULAR | Status: DC | PRN

## 2017-03-22 MED ORDER — HYDRALAZINE HCL 20 MG/ML IJ SOLN: 10.0000 mg | INTRAMUSCULAR | Status: DC | PRN

## 2017-03-22 MED ORDER — ACETAMINOPHEN 325 MG PO TABS: 650.0000 mg | ORAL_TABLET | Freq: Four times a day (QID) | ORAL | Status: DC | PRN

## 2017-03-22 MED ORDER — PROPOFOL 1000 MG/100ML IV EMUL
INTRAVENOUS | Status: AC
  Filled 2017-03-22: qty 100

## 2017-03-22 MED ORDER — MORPHINE SULFATE (PF) 4 MG/ML IV SOLN
2.0000 mg | INTRAVENOUS | Status: DC | PRN
  Administered 2017-03-22 (×2): 2 mg via INTRAVENOUS
  Administered 2017-03-23: 4 mg via INTRAVENOUS
  Filled 2017-03-22 (×3): qty 1

## 2017-03-22 MED ORDER — SODIUM CHLORIDE 0.9 % IV SOLN
INTRAVENOUS | Status: DC
  Administered 2017-03-22: 03:00:00 via INTRAVENOUS

## 2017-03-22 MED ORDER — NICOTINE 21 MG/24HR TD PT24
21.0000 mg | MEDICATED_PATCH | Freq: Every day | TRANSDERMAL | Status: DC
  Administered 2017-03-22: 21 mg via TRANSDERMAL
  Filled 2017-03-22: qty 1

## 2017-03-22 MED ORDER — SODIUM CHLORIDE 0.9 % IV SOLN
Freq: Once | INTRAVENOUS | Status: AC
  Administered 2017-03-22: 03:00:00 via INTRAVENOUS
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
## 2017-03-22 NOTE — Progress Notes (Signed)
Pt arrived to 2C04. HR 88 BP 90/49. O2 sats 100 on 4L. Pt had abdominal hematoma and RN marked area with sharpie. Pt alert and oriented x4. 1 unit of platelets given in ED and pt transferred with 1 unstarted unit of platelets. Once arrived on unit platelets immediately started. BP cuff changed upon arrival and BP 81/49. At 0555. Pt BP dropped to 52/19. Pt remained alert and oriented and Rapid Response called. Pt immediately transferred to 4NICU.
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
## 2017-03-22 NOTE — ED Notes (Signed)
Family updated as to patient's status.
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
## 2017-03-22 NOTE — Significant Event (Signed)
Rapid Response Event Note  Overview: Time Called: 0551 Arrival Time: 0553 Event Type: Hypotension  Initial Focused Assessment: Restless and hypotensive   Interventions: IV NS bolus  Plan of Care (if not transferred):  Event Summary: Called to assist with care of patient with BP of 60's systolic and restless. Admission history was quickly reviewed with unit staff. Patient was alert and responsive. Making attempts to get out bed. Skin was slight diaphoretic. Heart with regular rate and rhythm. Abd binder was intact but patient was trying to pull it off. LL quad was firm and tender per patient. Due to the level of distress and low BP, patient was started on a IV NS fluid bolus of 500 cc. IV platelets were increased to wide open. On-call provider Dr. Margaree Mackintoshsui was made aware of patient condition. Patient was moved to ICU 4N 20.  Beryl MeagerHarbison, [place]

## 2017-03-22 NOTE — Progress Notes (Signed)
[facility] MD on unit. Pt receiving 2nd platelet at this time. Order was put in while pt was in ED per MD.

## 2017-03-22 NOTE — Progress Notes (Signed)
Pt transferred to 2c2. Report called to receiving rn. Assessment stable.

## 2017-03-22 NOTE — ED Notes (Signed)
Blood draw Qns will draw addl labs when pt returns from CT

## 2017-03-22 NOTE — ED Notes (Signed)
To CT at this time with Shanda BumpsJessica RN and Dr. Corliss Skainssuei
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
## 2017-03-23 ENCOUNTER — Encounter (HOSPITAL_COMMUNITY): Payer: Self-pay

## 2017-03-23 LAB — BPAM PLATELET PHERESIS
Blood Product Expiration Date: 201807282359
Blood Product Expiration Date: 201807282359
ISSUE DATE / TIME: 201807280215
ISSUE DATE / TIME: 201807280431
Unit Type and Rh: 5100
Unit Type and Rh: 6200

## 2017-03-23 LAB — BASIC METABOLIC PANEL
ANION GAP: 7 (ref 5–15)
BUN: 16 mg/dL (ref 6–20)
CHLORIDE: 103 mmol/L (ref 101–111)
CO2: 26 mmol/L (ref 22–32)
Calcium: 8.5 mg/dL — ABNORMAL LOW (ref 8.9–10.3)
Creatinine, Ser: 1.08 mg/dL (ref 0.61–1.24)
GFR calc non Af Amer: >60 mL/min (ref >60)
Glucose, Bld: 141 mg/dL — ABNORMAL HIGH (ref 65–99)
POTASSIUM: 3.9 mmol/L (ref 3.5–5.1)
Sodium: 136 mmol/L (ref 135–145)

## 2017-03-23 LAB — CBC
HCT: 36.7 % — ABNORMAL LOW (ref 39.0–52.0)
HEMOGLOBIN: 12.2 g/dL — AB (ref 13.0–17.0)
MCH: 31.8 pg (ref 26.0–34.0)
MCHC: 33.2 g/dL (ref 30.0–36.0)
MCV: 95.6 fL (ref 78.0–100.0)
Platelets: 241 10*3/uL (ref 150–400)
RBC: 3.84 MIL/uL — AB (ref 4.22–5.81)
RDW: 13.5 % (ref 11.5–15.5)
WBC: 12.5 10*3/uL — ABNORMAL HIGH (ref 4.0–10.5)

## 2017-03-23 LAB — PREPARE PLATELET PHERESIS
UNIT DIVISION: 0
Unit division: 0

## 2017-03-23 MED ORDER — OXYCODONE HCL 5 MG PO TABS: 5.0000 mg | ORAL_TABLET | ORAL | 0 refills | Status: DC | PRN

## 2017-03-23 NOTE — Progress Notes (Signed)
Patient c/o abdominal binder being very tight difficult to breathe. O2 saturations decreasing esp when eyes closed. o2 n/c applied. Abdominal binder too small for patient, changed to XL, fit much better, tight to continue to apply pressure to hematoma on right lower abdomen. No change in marking of hematoma, same.
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
## 2017-03-23 NOTE — Plan of Care (Signed)
Problem: Infection: Goal: Risk for infection will decrease (spleen) Outcome: Progressing  No signs or symptoms of infection  Problem: Bowel/Gastric: Goal: GI tract motility and GI tissue perfusion will improve Outcome: Progressing Had stool

## 2017-03-23 NOTE — Progress Notes (Signed)
Tech offered Pt a bath. Pt stated that he would like to wait until he gets his discharged papers, and will wash-up as he is getting dress to go home. Tech gave Pt all bath supplies.

## 2017-03-23 NOTE — Progress Notes (Signed)
Discharge instructions given to pt. Answered all pt questions. Pt being discharge to home with all belongings.

## 2017-03-24 ENCOUNTER — Encounter (HOSPITAL_COMMUNITY): Payer: Self-pay

## 2017-04-07 ENCOUNTER — Encounter: Payer: Self-pay | Admitting: Cardiology
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
## 2017-04-23 ENCOUNTER — Ambulatory Visit (INDEPENDENT_AMBULATORY_CARE_PROVIDER_SITE_OTHER): Payer: Self-pay | Admitting: Cardiology

## 2017-04-23 ENCOUNTER — Encounter: Payer: Self-pay | Admitting: Cardiology

## 2017-04-23 VITALS — BP 110/70 | HR 72 | Ht 66.0 in | Wt 253.0 lb

## 2017-04-23 DIAGNOSIS — E785 Hyperlipidemia, unspecified: Secondary | ICD-10-CM | POA: Insufficient documentation

## 2017-04-23 DIAGNOSIS — Z720 Tobacco use: Secondary | ICD-10-CM

## 2017-04-23 DIAGNOSIS — I2510 Atherosclerotic heart disease of native coronary artery without angina pectoris: Secondary | ICD-10-CM

## 2017-04-23 NOTE — Patient Instructions (Signed)
Medication Instructions:  The current medical regimen is effective;  continue present plan and medications.  Follow-Up: Follow up in 6 months with Dr. Hochrein.  You will receive a letter in the mail 2 months before you are due.  Please call us when you receive this letter to schedule your follow up appointment.  If you need a refill on your cardiac medications before your next appointment, please call your pharmacy.  Thank you for choosing [facility]!!       

## 2017-04-24 NOTE — Addendum Note (Signed)
Addended by: Sharin GraveFLEMING, PAMELA J on: 04/24/2017 12:08 PM   Modules accepted: Orders

## 2017-05-07 ENCOUNTER — Encounter: Payer: Self-pay | Admitting: *Deleted

## 2017-05-07 DIAGNOSIS — Z006 Encounter for examination for normal comparison and control in clinical research program: Secondary | ICD-10-CM
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
## 2017-11-05 ENCOUNTER — Telehealth: Payer: Self-pay | Admitting: *Deleted

## 2017-11-05 NOTE — Telephone Encounter (Signed)
TWILIGHT Research study: Left message for patient to call research office. He had an appointment yesterday and [place] spoke with him about garage code we thought he was coming. Informed patient that Dr. Antoine PocheHochrein was prescribing ASA 81 mg daily after TWILIGHT study. I still need to know if he experienced any bleedings events and how many remaining study provided pills he has.

## 2017-11-19 ENCOUNTER — Other Ambulatory Visit: Payer: Self-pay | Admitting: *Deleted

## 2017-11-19 ENCOUNTER — Telehealth: Payer: Self-pay | Admitting: *Deleted

## 2017-11-19 MED ORDER — ASPIRIN EC 81 MG PO TBEC: 81.0000 mg | DELAYED_RELEASE_TABLET | Freq: Every day | ORAL | 3 refills | Status: AC

## 2017-11-19 NOTE — Telephone Encounter (Signed)
TWILIGHT Research study 15 month follow up completed via phone because patient did not show up fo appointment . He denies any bleeding events. He started ASA 81 mg Daily on 11/10/17 as instructed by my voice messages. He does not know how many brilinta he had remaining but he had zero ASA/Placebo. I thanked him for his participation in the study and told him I have 1 remaining call due in May. He verbalized understanding.

## 2017-11-24 MED ORDER — ASPIRIN EC 81 MG PO TBEC: 81.0000 mg | DELAYED_RELEASE_TABLET | Freq: Every day | ORAL | 3 refills | Status: DC

## 2017-12-30 ENCOUNTER — Encounter: Payer: Self-pay | Admitting: *Deleted

## 2017-12-30 DIAGNOSIS — Z006 Encounter for examination for normal comparison and control in clinical research program: Secondary | ICD-10-CM

## 2017-12-30 NOTE — Progress Notes (Signed)
TWILIGHT Research study month 18 telephone follow up completed. Patient denies any adverse events and continues with ASA 81 mg. I thanked him for his participation.

## 2018-01-13 ENCOUNTER — Other Ambulatory Visit: Payer: Self-pay | Admitting: Physician Assistant

## 2018-05-26 ENCOUNTER — Encounter: Payer: Self-pay | Admitting: Nurse Practitioner

## 2018-05-26 ENCOUNTER — Ambulatory Visit: Payer: BLUE CROSS/BLUE SHIELD | Admitting: Nurse Practitioner

## 2018-05-26 VITALS — BP 116/64 | HR 76 | Temp 97.1°F | Ht 66.0 in | Wt 264.0 lb

## 2018-05-26 DIAGNOSIS — R350 Frequency of micturition: Secondary | ICD-10-CM

## 2018-05-26 DIAGNOSIS — N3000 Acute cystitis without hematuria: Secondary | ICD-10-CM | POA: Diagnosis not present

## 2018-05-26 DIAGNOSIS — R739 Hyperglycemia, unspecified: Secondary | ICD-10-CM

## 2018-05-26 DIAGNOSIS — E119 Type 2 diabetes mellitus without complications: Secondary | ICD-10-CM

## 2018-05-26 LAB — URINALYSIS, COMPLETE
Bilirubin, UA: NEGATIVE
Glucose, UA: NEGATIVE
KETONES UA: NEGATIVE
Nitrite, UA: NEGATIVE
Protein, UA: NEGATIVE
SPEC GRAV UA: 1.025 (ref 1.005–1.030)
Urobilinogen, Ur: 0.2 mg/dL (ref 0.2–1.0)
pH, UA: 6 (ref 5.0–7.5)

## 2018-05-26 LAB — MICROSCOPIC EXAMINATION

## 2018-05-26 MED ORDER — SULFAMETHOXAZOLE-TRIMETHOPRIM 800-160 MG PO TABS: 1.0000 | ORAL_TABLET | Freq: Two times a day (BID) | ORAL | 0 refills | Status: DC

## 2018-05-26 NOTE — Progress Notes (Signed)
Subjective:    Patient ID: Randall Hall, male    DOB: 01/10/1956, 61 y.o.   MRN: [id number]   Chief Complaint: Urinary Frequency   HPI Patient comes in today c/o urinary frequency. He says it is worse at night. Difficulty starting steam and cannot keep a steady stream. Has been going on for over a year but seems to be worsening.    Review of Systems  Constitutional: Negative.   Respiratory: Negative.   Cardiovascular: Negative.   Genitourinary: Positive for frequency and urgency. Negative for dysuria, hematuria, penile pain, penile swelling and testicular pain.  Neurological: Negative.   Psychiatric/Behavioral: Negative.   All other systems reviewed and are negative.      Objective:   Physical Exam  Constitutional: He is oriented to person, place, and time. He appears well-developed and well-nourished. No distress.  Cardiovascular: Normal rate.  Pulmonary/Chest: Effort normal.  Abdominal: Soft. Bowel sounds are normal.  No CVA tenderness   Neurological: He is alert and oriented to person, place, and time.  Skin: Skin is warm.   BP 116/64   Pulse 76   Temp (!) 97.1 F (36.2 C) (Oral)   Ht 5' 6" (1.676 m)   Wt 264 lb (119.7 kg)   BMI 42.61 kg/m   ua- leuks 2+  bood trace      Assessment & Plan:  Randall Hall in today with chief complaint of Urinary Frequency   1. Frequent urination - Urinalysis, Complete - CMP14+EGFR - PSA, total and free  2. Acute cystitis without hematuria Take medication as prescribe Cotton underwear Take shower not bath Cranberry juice, yogurt Force fluids AZO over the counter X2 days Culture pending RTO prn Will call with PSA results Meds ordered this encounter  Medications  . sulfamethoxazole-trimethoprim (BACTRIM DS) 800-160 MG tablet    Sig: Take 1 tablet by mouth 2 (two) times daily.    Dispense:  14 tablet    Refill:  0    Order Specific Question:   Supervising Provider    Answer:   VINCENT, CAROL L [[id number]]     Mary-Margaret Martin, FNP    

## 2018-05-26 NOTE — Patient Instructions (Signed)
Urinary Tract Infection, Adult °A urinary tract infection (UTI) is an infection of any part of the urinary tract. The urinary tract includes the: °· Kidneys. °· Ureters. °· Bladder. °· Urethra. ° °These organs make, store, and get rid of pee (urine) in the body. °Follow these instructions at home: °· Take over-the-counter and prescription medicines only as told by your doctor. °· If you were prescribed an antibiotic medicine, take it as told by your doctor. Do not stop taking the antibiotic even if you start to feel better. °· Avoid the following drinks: °? Alcohol. °? Caffeine. °? Tea. °? Carbonated drinks. °· Drink enough fluid to keep your pee clear or pale yellow. °· Keep all follow-up visits as told by your doctor. This is important. °· Make sure to: °? Empty your bladder often and completely. Do not to hold pee for long periods of time. °? Empty your bladder before and after sex. °? Wipe from front to back after a bowel movement if you are male. Use each tissue one time when you wipe. °Contact a doctor if: °· You have back pain. °· You have a fever. °· You feel sick to your stomach (nauseous). °· You throw up (vomit). °· Your symptoms do not get better after 3 days. °· Your symptoms go away and then come back. °Get help right away if: °· You have very bad back pain. °· You have very bad lower belly (abdominal) pain. °· You are throwing up and cannot keep down any medicines or water. °This information is not intended to replace advice given to you by your health care provider. Make sure you discuss any questions you have with your health care provider. °Document Released: 01/29/2008 Document Revised: 01/18/2016 Document Reviewed: 07/03/2015 °Elsevier Interactive Patient Education © 2018 Elsevier Inc. ° °

## 2018-05-27 LAB — CMP14+EGFR
A/G RATIO: 1.9 (ref 1.2–2.2)
ALT: 32 IU/L (ref 0–44)
AST: 21 IU/L (ref 0–40)
Albumin: 4.4 g/dL (ref 3.6–4.8)
Alkaline Phosphatase: 78 IU/L (ref 39–117)
BILIRUBIN TOTAL: 0.2 mg/dL (ref 0.0–1.2)
BUN / CREAT RATIO: 16 (ref 10–24)
BUN: 18 mg/dL (ref 8–27)
CALCIUM: 9.2 mg/dL (ref 8.6–10.2)
CO2: 23 mmol/L (ref 20–29)
Chloride: 99 mmol/L (ref 96–106)
Creatinine, Ser: 1.14 mg/dL (ref 0.76–1.27)
GFR, EST AFRICAN AMERICAN: 79 mL/min/{1.73_m2} (ref >59)
GFR, EST NON AFRICAN AMERICAN: 69 mL/min/{1.73_m2} (ref >59)
GLOBULIN, TOTAL: 2.3 g/dL (ref 1.5–4.5)
Glucose: 129 mg/dL — ABNORMAL HIGH (ref 65–99)
POTASSIUM: 4.6 mmol/L (ref 3.5–5.2)
SODIUM: 139 mmol/L (ref 134–144)
TOTAL PROTEIN: 6.7 g/dL (ref 6.0–8.5)

## 2018-05-27 LAB — URINE CULTURE

## 2018-05-27 LAB — PSA, TOTAL AND FREE
PSA FREE PCT: 18 %
PSA, Free: 0.18 ng/mL
Prostate Specific Ag, Serum: 1 ng/mL (ref 0.0–4.0)

## 2018-06-01 NOTE — Addendum Note (Signed)
Addended by: Bennie Pierini on: 06/01/2018 07:51 AM   Modules accepted: Orders

## 2018-06-02 LAB — BAYER DCA HB A1C WAIVED: HB A1C: 7.2 % — AB (ref <7.0)

## 2018-06-02 NOTE — Addendum Note (Signed)
Addended by: Prescott Gum on: 06/02/2018 04:06 PM   Modules accepted: Orders

## 2018-06-03 DIAGNOSIS — T7840XA Allergy, unspecified, initial encounter: Secondary | ICD-10-CM | POA: Diagnosis not present

## 2018-06-03 DIAGNOSIS — R062 Wheezing: Secondary | ICD-10-CM | POA: Diagnosis not present

## 2018-06-03 DIAGNOSIS — E669 Obesity, unspecified: Secondary | ICD-10-CM | POA: Diagnosis not present

## 2018-06-03 DIAGNOSIS — R0600 Dyspnea, unspecified: Secondary | ICD-10-CM | POA: Diagnosis not present

## 2018-06-03 DIAGNOSIS — L509 Urticaria, unspecified: Secondary | ICD-10-CM | POA: Diagnosis not present

## 2018-06-03 DIAGNOSIS — R0602 Shortness of breath: Secondary | ICD-10-CM | POA: Diagnosis not present

## 2018-06-03 DIAGNOSIS — Z951 Presence of aortocoronary bypass graft: Secondary | ICD-10-CM | POA: Diagnosis not present

## 2018-06-03 DIAGNOSIS — F17210 Nicotine dependence, cigarettes, uncomplicated: Secondary | ICD-10-CM | POA: Diagnosis not present

## 2018-06-03 DIAGNOSIS — Z6841 Body Mass Index (BMI) 40.0 and over, adult: Secondary | ICD-10-CM | POA: Diagnosis not present

## 2018-06-03 DIAGNOSIS — Z955 Presence of coronary angioplasty implant and graft: Secondary | ICD-10-CM | POA: Diagnosis not present

## 2018-06-03 MED ORDER — METFORMIN HCL 500 MG PO TABS: 500.0000 mg | ORAL_TABLET | Freq: Two times a day (BID) | ORAL | 1 refills | Status: DC

## 2018-06-03 NOTE — Addendum Note (Signed)
Addended by: Bennie Pierini on: 06/03/2018 07:48 AM   Modules accepted: Orders

## 2018-06-25 NOTE — Progress Notes (Signed)
Cardiology Office Note   Date:  06/26/2018   ID:  Randall Hall, DOB [birth year], MRN [id number]  PCP:  Bennie Pierini, FNP  Cardiologist:   Rollene Rotunda, MD   Chief Complaint  Patient presents with  . Coronary Artery Disease      History of Present Illness: Randall Hall is a 62 y.o. male who presents for follow-up of coronary disease.  He is status post DES to a vein graft as below.   Since his last visit he has been diagnosed with diabetes.  He is been put on metformin.  He denies any cardiovascular symptoms.  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  He goes hunting.  He denies any cardiovascular symptoms.  He is had none of the chest pain that was his previous angina.The patient denies any new symptoms such as chest discomfort, neck or arm discomfort. There has been no new shortness of breath, PND or orthopnea. There have been no reported palpitations, presyncope or syncope.   Past Medical History:  Diagnosis Date  . CAD (coronary artery disease)   . History of myocardial infarction   . Hx of CABG   . Hyperlipemia   . MI (myocardial infarction) (HCC)   . Tobacco abuse     Past Surgical History:  Procedure Laterality Date  . CARDIAC CATHETERIZATION N/A 07/16/2016   Procedure: Left Heart Cath and Cors/Grafts Angiography;  Surgeon: Kathleene Hazel, MD;  Location: [facility] INVASIVE CV LAB;  Service: Cardiovascular;  Laterality: N/A;  . CARDIAC CATHETERIZATION N/A 07/16/2016   Procedure: Coronary Stent Intervention;  Surgeon: Kathleene Hazel, MD;  Location: [facility] INVASIVE CV LAB;  Service: Cardiovascular;  Laterality: N/A;  . CORONARY ANGIOPLASTY WITH STENT PLACEMENT    . CORONARY ARTERY BYPASS GRAFT     2004 Dr. Laneta Simmers LIMA to the LAD, SVG to PDA and posterior lateral, SVG to diagonal, RIMA to obtuse marginal.  Last catheterization 2008.     Current Outpatient Medications  Medication Sig Dispense Refill  . aspirin EC  81 MG tablet Take 1 tablet (81 mg total) by mouth daily. 90 tablet 3  . atorvastatin (LIPITOR) 40 MG tablet Take 1 tablet (40 mg total) by mouth daily at 6 PM. 30 tablet 6  . b complex vitamins tablet Take 1 tablet by mouth.      . fish oil-omega-3 fatty acids 1000 MG capsule Take 1 capsule by mouth daily.     Marland Kitchen GARLIC PO Take 1 tablet by mouth daily.    . metFORMIN (GLUCOPHAGE) 500 MG tablet Take 1 tablet (500 mg total) by mouth 2 (two) times daily with a meal. 180 tablet 1  . nicotine (NICODERM CQ - DOSED IN MG/24 HOURS) 21 mg/24hr patch Place 1 patch (21 mg total) onto the skin daily. 28 patch 0  . nitroGLYCERIN (NITROSTAT) 0.4 MG SL tablet PLACE 1 TABLET (0.4 MG TOTAL) UNDER THE TONGUE EVERY 5 (FIVE) MINUTES AS NEEDED FOR CHEST PAIN. 25 tablet 0  . VITAMIN E PO Take 1 capsule by mouth daily.     No current facility-administered medications for this visit.     Allergies:   Sulfa antibiotics    ROS:  Please see the history of present illness.   Otherwise, review of systems are positive for none.   All other systems are reviewed and negative.    PHYSICAL EXAM: VS:  BP 112/74   Pulse 76   Ht 5'  6" (1.676 m)   Wt 262 lb 6.4 oz (119 kg)   BMI 42.35 kg/m  , BMI Body mass index is 42.35 kg/m.  GENERAL:  Well appearing NECK:  No jugular venous distention, waveform within normal limits, carotid upstroke brisk and symmetric, no bruits, no thyromegaly LUNGS:  Clear to auscultation bilaterally CHEST:  Well healed sternotomy scar. HEART:  PMI not displaced or sustained,S1 and S2 within normal limits, no S3, no S4, no clicks, no rubs, no murmurs ABD:  Flat, positive bowel sounds normal in frequency in pitch, no bruits, no rebound, no guarding, no midline pulsatile mass, no hepatomegaly, no splenomegaly, obese EXT:  2 plus pulses throughout, no edema, no cyanosis no clubbing   CATH:       Prox RCA lesion, 100 %stenosed.  Ost Cx to Prox Cx lesion, 50 %stenosed.  Prox Cx to Mid Cx  lesion, 90 %stenosed.  Ost 2nd Mrg to 2nd Mrg lesion, 100 %stenosed.  SVG graft was visualized by angiography and is normal in caliber and anatomically normal.  Mid LAD lesion, 100 %stenosed.  Ost LAD to Prox LAD lesion, 30 %stenosed.  LIMA graft was visualized by angiography and is normal in caliber and anatomically normal.  SVG graft was visualized by angiography.  Origin to Prox Graft lesion, 100 %stenosed.  SVG graft was visualized by angiography and is normal in caliber.  Mid Graft lesion, 30 %stenosed.  A STENT PROMUS PREM MR 3.5X16 drug eluting stent was successfully placed.  Dist Graft lesion, 90 %stenosed.  Post intervention, there is a 0% residual stenosis.   1. Severe triple vessel CAD s/p 4V CABG with 3/4 patent bypass grafts.  2. The mid LAD is occluded. The mid and distal LAD fills from the patent LIMA graft. The vein graft to the diagonal is occluded.  3. The OM branch is occluded and fills from the patent vein graft. The AV groove Circumflex has ostial 50% stenosis and diffuse mid 90% stenosis (unchanged from last cath).  4. The RCA is occluded at the ostium. The vein graft to the PDA is patent but there is a 90% stenosis in the distal body of the vein graft, TIMI-1 flow.  5. Successful PTCA/DES x 1 distal body of SVG to PDA.       EKG:  EKG is ordered today. The ekg ordered today demonstrates sinus rhythm, rate 76, axis within normal limits, intervals within normal limits, no acute ST-T wave changes.   Recent Labs: 05/26/2018: ALT 32; BUN 18; Creatinine, Ser 1.14; Potassium 4.6; Sodium 139       Wt Readings from Last 3 Encounters:  06/26/18 262 lb 6.4 oz (119 kg)  05/26/18 264 lb (119.7 kg)  04/23/17 253 lb (114.8 kg)      Other studies Reviewed: Additional studies/ records that were reviewed today include:   Review of the above records demonstrates:      ASSESSMENT AND PLAN:  CAD:  The patient has no new sypmtoms.  No further cardiovascular  testing is indicated.  We will continue with aggressive risk reduction and meds as listed.  FATIGUE:  He has not wanted a sleep study.  He says he would not be able exercise.   DYSLIPIDEMIA:    He will have a lipid profile.    DM:  AC1 7.2.  We went over this and I convinced him that he does indeed have diabetes.  He is been work on his diet and hope to come off the medications.  TOBACCO: We talked about the need to stop smoking and he continues to wear the patches at times but is not interested in other therapies.   OBESITY:    He understands the need to lose weight with diet and exercise.   Current medicines are reviewed at length with the patient today.  The patient does not have concerns regarding medicines.  The following changes have been made:  None  Labs/ tests ordered today include:    Orders Placed This Encounter  Procedures  . EKG 12-Lead     Disposition:   FU with me in 12 months.    Signed, Rollene Rotunda, MD  06/26/2018 3:02 PM    [facility]

## 2018-06-26 ENCOUNTER — Encounter: Payer: Self-pay | Admitting: Cardiology

## 2018-06-26 ENCOUNTER — Ambulatory Visit: Payer: BLUE CROSS/BLUE SHIELD | Admitting: Cardiology

## 2018-06-26 VITALS — BP 112/74 | HR 76 | Ht 66.0 in | Wt 262.4 lb

## 2018-06-26 DIAGNOSIS — Z720 Tobacco use: Secondary | ICD-10-CM

## 2018-06-26 DIAGNOSIS — I25700 Atherosclerosis of coronary artery bypass graft(s), unspecified, with unstable angina pectoris: Secondary | ICD-10-CM | POA: Diagnosis not present

## 2018-06-26 DIAGNOSIS — E785 Hyperlipidemia, unspecified: Secondary | ICD-10-CM

## 2018-06-26 DIAGNOSIS — R5383 Other fatigue: Secondary | ICD-10-CM | POA: Diagnosis not present

## 2018-06-26 DIAGNOSIS — E118 Type 2 diabetes mellitus with unspecified complications: Secondary | ICD-10-CM

## 2018-06-26 NOTE — Patient Instructions (Signed)
Medication Instructions:  Continue current medications  If you need a refill on your cardiac medications before your next appointment, please call your pharmacy.  Labwork: None Ordered   If you have labs (blood work) drawn today and your tests are completely normal, you will receive your results only by: . MyChart Message (if you have MyChart) OR . A paper copy in the mail If you have any lab test that is abnormal or we need to change your treatment, we will call you to review the results.  Testing/Procedures: None Ordered  Follow-Up: You will need a follow up appointment in 1 Year.  Please call our office 2 months in advance([phone]) to schedule the (1 Year) appointment.  You may see  DR Hochrein or one of the following Advanced Practice Providers on your designated Care Team:   . Kathryn Lawrence, DNP, ANP . Rhonda Barrett, PA-C .  . Luke Kilroy, PA-C . Krista Kroeger, PA-C . Callie Goodrich, PA-C .  . Hao Meng, PA-C . Angela Duke, PA-C  At [facility], you and your health needs are our priority.  As part of our continuing mission to provide you with exceptional heart care, we have created designated Provider Care Teams.  These Care Teams include your primary Cardiologist (physician) and Advanced Practice Providers (APPs -  Physician Assistants and Nurse Practitioners) who all work together to provide you with the care you need, when you need it.   Thank you for choosing [facility] at [facility]!!       

## 2018-07-28 ENCOUNTER — Encounter: Payer: BLUE CROSS/BLUE SHIELD | Admitting: Nurse Practitioner

## 2018-08-05 ENCOUNTER — Ambulatory Visit: Payer: Self-pay | Admitting: Cardiology

## 2018-10-16 ENCOUNTER — Encounter: Payer: Self-pay | Admitting: Family Medicine

## 2018-10-16 ENCOUNTER — Ambulatory Visit (INDEPENDENT_AMBULATORY_CARE_PROVIDER_SITE_OTHER): Payer: BLUE CROSS/BLUE SHIELD | Admitting: Family Medicine

## 2018-10-16 VITALS — BP 129/78 | HR 75 | Temp 97.7°F | Ht 66.0 in | Wt 264.0 lb

## 2018-10-16 DIAGNOSIS — Z720 Tobacco use: Secondary | ICD-10-CM

## 2018-10-16 DIAGNOSIS — I25700 Atherosclerosis of coronary artery bypass graft(s), unspecified, with unstable angina pectoris: Secondary | ICD-10-CM | POA: Diagnosis not present

## 2018-10-16 DIAGNOSIS — E1169 Type 2 diabetes mellitus with other specified complication: Secondary | ICD-10-CM

## 2018-10-16 DIAGNOSIS — G4719 Other hypersomnia: Secondary | ICD-10-CM

## 2018-10-16 DIAGNOSIS — R351 Nocturia: Secondary | ICD-10-CM | POA: Diagnosis not present

## 2018-10-16 DIAGNOSIS — E785 Hyperlipidemia, unspecified: Secondary | ICD-10-CM

## 2018-10-16 DIAGNOSIS — E118 Type 2 diabetes mellitus with unspecified complications: Secondary | ICD-10-CM

## 2018-10-16 DIAGNOSIS — G4730 Sleep apnea, unspecified: Secondary | ICD-10-CM

## 2018-10-16 DIAGNOSIS — M6208 Separation of muscle (nontraumatic), other site: Secondary | ICD-10-CM

## 2018-10-16 LAB — BAYER DCA HB A1C WAIVED: HB A1C (BAYER DCA - WAIVED): 6.6 % (ref <7.0)

## 2018-10-16 MED ORDER — METFORMIN HCL 500 MG PO TABS: 500.0000 mg | ORAL_TABLET | Freq: Two times a day (BID) | ORAL | 1 refills | Status: DC

## 2018-10-16 MED ORDER — ATORVASTATIN CALCIUM 40 MG PO TABS: 40.0000 mg | ORAL_TABLET | Freq: Every day | ORAL | 1 refills | Status: DC

## 2018-10-16 MED ORDER — BUPROPION HCL ER (XL) 150 MG PO TB24: 150.0000 mg | ORAL_TABLET | Freq: Every day | ORAL | 0 refills | Status: DC

## 2018-10-16 NOTE — Progress Notes (Signed)
Subjective: CC: DM PCP: Raliegh Ip, DO OFV:WAQL Randall Hall is a 63 y.o. male presenting to clinic today for:  1. Type 2 Diabetes with morbid obesity:  Patient reports having been recently diagnosed with DM2.  He reports a family history, including his brother who has poorly controlled type 2 diabetes.  He reports that he is worried about having a foot amputation as result of use of metformin.  He has been taking it but has also been modifying diet quite a bit with reduction in carbohydrates.  He is almost eliminated soda except he does occasionally still have 1.  He is replaced morning foods like biscuits with fruit.  Last eye exam: needs Last foot exam: needs Last A1c:  Lab Results  Component Value Date   HGBA1C 7.2 (H) 06/02/2018   Nephropathy screen indicated?: needs Last flu, zoster and/or pneumovax:  There is no immunization history on file for this patient.  ROS: denies fever, chills, dizziness, LOC, polyuria, polydipsia, unintended weight loss/gain, foot ulcerations, numbness or tingling in extremities or chest pain.  2.  Coronary artery disease with history of MI/hyperlipidemia/ tobacco use Patient with known coronary artery disease with history of MI.  He notes that he has been off of his cholesterol medication for some time now.  He is amenable to restarting this.  His on metformin as above for newly diagnosed type 2 diabetes.  He is an everyday tobacco user and has smoked about 1 pack/day for the last 42 years.  He was able to successfully quit for about a year and a half but then resumed smoking because his wife was a smoker and was his trigger.  He is tried Chantix in the past but he felt that "that messed with his head too much."  He denies any hemoptysis, shortness of breath.  He is very motivated to do anything that he can to avoid amputation now onset of type 2 diabetes.    3.  Concern for sleep apnea He was told by his wife that he snores and becomes apneic  during sleep.  He goes on to state that he has excessive daytime sleepiness and has had this for several months.  At times, he has found himself falling asleep during driving.  He is interested in having a sleep evaluation for sleep apnea.  4.  Nocturia Patient reports 2-3 episodes of nocturia per night.  He reports urinary hesitancy and decreased urinary stream.  At times he also has erectile dysfunction.  He has never had a PSA or prostate exam.  No known family history of prostate cancer, ovarian cancer but he does report a history of colon cancer in his paternal grandfather at age 54.  5.  Concern for hernia Patient reports also concern for hernia within the abdominal wall.  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  He denies any symptoms including hematochezia or difficulty having a bowel movement.  Certainly no nausea.  No pain.  ROS: Per HPI  Allergies  Allergen Reactions  . Sulfa Antibiotics Anaphylaxis    hives swelling   Past Medical History:  Diagnosis Date  . AKI (acute kidney injury) (HCC) 07/14/2016  . CAD (coronary artery disease)   . History of myocardial infarction   . Hx of CABG   . Hyperlipemia   . MI (myocardial infarction) (HCC)   . Tobacco abuse     Current Outpatient Medications:  .  aspirin EC 81  MG tablet, Take 1 tablet (81 mg total) by mouth daily., Disp: 90 tablet, Rfl: 3 .  b complex vitamins tablet, Take 1 tablet by mouth.  , Disp: , Rfl:  .  fish oil-omega-3 fatty acids 1000 MG capsule, Take 1 capsule by mouth daily. , Disp: , Rfl:  .  GARLIC PO, Take 1 tablet by mouth daily., Disp: , Rfl:  .  VITAMIN E PO, Take 1 capsule by mouth daily., Disp: , Rfl:  .  atorvastatin (LIPITOR) 40 MG tablet, Take 1 tablet (40 mg total) by mouth daily at 6 PM. (Patient not taking: Reported on 10/16/2018), Disp: 30 tablet, Rfl: 6 .  metFORMIN (GLUCOPHAGE) 500 MG tablet, Take 1 tablet (500 mg total) by mouth 2 (two) times daily with a  meal. (Patient not taking: Reported on 10/16/2018), Disp: 180 tablet, Rfl: 1 .  nitroGLYCERIN (NITROSTAT) 0.4 MG SL tablet, PLACE 1 TABLET (0.4 MG TOTAL) UNDER THE TONGUE EVERY 5 (FIVE) MINUTES AS NEEDED FOR CHEST PAIN. (Patient not taking: Reported on 10/16/2018), Disp: 25 tablet, Rfl: 0 Social History   Socioeconomic History  . Marital status: Married    Spouse name: Not on file  . Number of children: 4  . Years of education: Not on file  . Highest education level: Not on file  Occupational History  . Occupation: TRUCK DRIVER    Employer: [employer]  Social Needs  . Financial resource strain: Not on file  . Food insecurity:    Worry: Not on file    Inability: Not on file  . Transportation needs:    Medical: Not on file    Non-medical: Not on file  Tobacco Use  . Smoking status: Current Every Day Smoker    Packs/day: 1.00    Years: 21.00    Pack years: 21.00    Types: Cigarettes  . Smokeless tobacco: Never Used  Substance and Sexual Activity  . Alcohol use: Yes  . Drug use: No    Types: Marijuana  . Sexual activity: Not on file  Lifestyle  . Physical activity:    Days per week: Not on file    Minutes per session: Not on file  . Stress: Not on file  Relationships  . Social connections:    Talks on phone: Not on file    Gets together: Not on file    Attends religious service: Not on file    Active member of club or organization: Not on file    Attends meetings of clubs or organizations: Not on file    Relationship status: Not on file  . Intimate partner violence:    Fear of current or ex partner: Not on file    Emotionally abused: Not on file    Physically abused: Not on file    Forced sexual activity: Not on file  Other Topics Concern  . Not on file  Social History Narrative   ** Merged History Encounter **       ** Data from: 12/28/16 Enc Dept: [facility] DE[facility]   Married.  He has seven grandchildren.        ** Data from: 03/23/17 Enc Dept: [facility]  PROGRESSIVE CARE   Lives with wife   Family History  Problem Relation Age of Onset  . Diabetes Mother   . CAD Father 24       Died  . CAD Brother     Objective: Office vital signs reviewed. BP 129/78   Pulse 75   Temp  97.7 F (36.5 C) (Oral)   Ht 5\' 6"  (1.676 m)   Wt 264 lb (119.7 kg)   BMI 42.61 kg/m   Physical Examination:  General: Awake, alert, morbidly obese, No acute distress HEENT: Normal    Neck: enlarged neck girth Cardio: well healed midline scar on chest. Regular rate and rhythm, S1S2 heard, no murmurs appreciated Pulm: Prolonged expiratory phase.  Otherwise clear to auscultation bilaterally.  No wheezes, rhonchi or rales; normal work of breathing on room air GI: Several postsurgical scars that are well-healed noted on the abdomen.  There was a large diastases recti appreciated.  No palpable defects within the abdominal musculature to suggest hernia. Extremities: warm, well perfused, No edema, cyanosis or clubbing; +2 pulses bilaterally  Assessment/ Plan: 63 y.o. male   1. Type 2 diabetes mellitus with complication, without long-term current use of insulin (HCC) Under excellent control with metformin p.o. twice daily.  Refill has been sent to pharmacy.  We discussed that if he can get his A1c under 6 with diet modification, we may consider discontinuing the medication and seeing how he does.  He seemed very motivated by this and will continue to work on diet modification and increase physical exercise.  At next visit, we will need to plan for diabetic foot exam, urine microalbumin and pneumococcal vaccination.  Additionally, patient needs to have his diabetic eye exam performed. - Bayer DCA Hb A1c Waived - Basic Metabolic Panel - metFORMIN (GLUCOPHAGE) 500 MG tablet; Take 1 tablet (500 mg total) by mouth 2 (two) times daily with a meal.  Dispense: 180 tablet; Refill: 1  2. Hyperlipidemia associated with type 2 diabetes mellitus (HCC) We discussed risks of  cardiovascular disease including increased risk for peripheral vascular disease that could result in amputation in the future.  He will resume his Lipitor 40 mg.  We will check lipid panel today.  Of note this is nonfasting. - Basic Metabolic Panel - Lipid Panel - atorvastatin (LIPITOR) 40 MG tablet; Take 1 tablet (40 mg total) by mouth daily at 6 PM.  Dispense: 90 tablet; Refill: 1  3. Coronary artery disease involving coronary bypass graft of native heart with unstable angina pectoris (HCC) As above - Basic Metabolic Panel - atorvastatin (LIPITOR) 40 MG tablet; Take 1 tablet (40 mg total) by mouth daily at 6 PM.  Dispense: 90 tablet; Refill: 1  4. Tobacco abuse Did not tolerate Chantix.  We discussed Wellbutrin as an alternative therapy.  He he was amenable to this and this was initiated today. - buPROPion (WELLBUTRIN XL) 150 MG 24 hr tablet; Take 1 tablet (150 mg total) by mouth daily.  Dispense: 90 tablet; Refill: 0  5. Morbid obesity (HCC) As above - buPROPion (WELLBUTRIN XL) 150 MG 24 hr tablet; Take 1 tablet (150 mg total) by mouth daily.  Dispense: 90 tablet; Refill: 0  6. Diastasis of rectus abdominis Abdominal exam notable for diastases rectus.  No evidence of hernia on exam.  7. Nocturia more than twice per night No prostate exam performed today given multiple other concerns as above.  Check PSA.  May need to consider starting Flomax to improve symptoms. - PSA  8. Excessive daytime sleepiness Stop bang score 7. High likelihood for OSA/ OHS - Ambulatory referral to Sleep Studies  9. Sleep apnea, unspecified type As above. - Ambulatory referral to Sleep Studies   Orders Placed This Encounter  Procedures  . Bayer DCA Hb A1c Waived  . Basic Metabolic Panel  . Lipid Panel  .  PSA  . Ambulatory referral to Sleep Studies    Referral Priority:   Routine    Referral Type:   Consultation    Referral Reason:   Specialty Services Required    Number of Visits Requested:   1     Meds ordered this encounter  Medications  . metFORMIN (GLUCOPHAGE) 500 MG tablet    Sig: Take 1 tablet (500 mg total) by mouth 2 (two) times daily with a meal.    Dispense:  180 tablet    Refill:  1  . atorvastatin (LIPITOR) 40 MG tablet    Sig: Take 1 tablet (40 mg total) by mouth daily at 6 PM.    Dispense:  90 tablet    Refill:  1  . buPROPion (WELLBUTRIN XL) 150 MG 24 hr tablet    Sig: Take 1 tablet (150 mg total) by mouth daily.    Dispense:  90 tablet    Refill:  0     Chanay Nugent Hulen SkainsM Niccolas Loeper, DO [facility] [phone]

## 2018-10-16 NOTE — Patient Instructions (Signed)
I have sent in buproprion. Take 1 tablet daily for smoking cessation.  This can cause decreased appetite and result in weight loss.  It can also improve concentration.  Start taking your cholesterol medication again.  This is important since you have diabetes and history of heart disease.  I have renewed this prescription.  Continue to reduce carbs (sugar, bread, pasta, rice, potatoes, juice, etc).  If your A1c goes down (below 6), we may be able to get you off of the Metformin.  We discussed your sugar level is good today.  Metformin is the gold standard of treatment and more often than not saves lives.  Continue the medication for now and we will recheck in 3 months.  The area on your belly is called diastasis recti. It is not a hernia.  See below.  Diastasis Recti  Diastasis recti is when the muscles of the abdomen (rectus abdominis muscles) become thin and separate. The result is a wider space between the right and left abdomen (abdominal) muscles. This wider space between the muscles may cause a bulge in the middle of your abdomen. You may notice this bulge when you are straining or when you sit up from a lying down position. Diastasis recti can affect men and women. It is most common among pregnant women, infants, people who are obese, and people who have had abdominal surgery. Exercise or surgical treatment may help correct it. What are the causes? Common causes of this condition include:  Pregnancy. The growing uterus puts pressure on the abdominal muscles, which causes the muscles to separate.  Obesity. Excess fat puts pressure on abdominal muscles.  Weightlifting.  Some abdomen exercises.  Advanced age.  Genetics.  Prior abdominal surgery. What increases the risk? This condition is more likely to develop in:  Women.  Newborns, especially newborns who are born early (prematurely). What are the signs or symptoms? Common symptoms of this condition include:  A bulge in the  middle of the abdomen. You will notice it most when you sit up or strain.  Pain in the low back, pelvis, or hips.  Constipation.  Inability to control when you urinate (urinary incontinence).  Bloating.  Poor posture. How is this diagnosed? This condition is diagnosed with a physical exam. Your health care provider will ask you to lie flat on your back and do a crunch or half sit-up. If you have diastasis recti, a vertical bulge will appear between your abdominal muscles in the center of your abdomen. Your health care provider will measure the gap between your muscles with one of the following:  A medical device used to measure the space between two objects (caliper).  A tape measure.  CT scan.  Ultrasound.  Finger spaces. Your health care provider will measure the space using their fingers. How is this treated? If your muscle separation is not too large, you may not need treatment. However, if you are a woman who plans to become pregnant again, you should treat this condition before your next pregnancy. Treatment may include:  Physical therapy to strengthen and tighten your abdominal muscles.  Lifestyle changes such as weight loss and exercise.  Over-the-counter pain medicines as needed.  Surgery to correct the separation. Follow these instructions at home: Activity  Return to your normal activities as told by your health care provider. Ask your health care provider what activities are safe for you.  When lifting weights or doing exercises using your abdominal muscles or the muscles in the center of  your body that give stability (core muscles), make sure you are doing your exercises and movements correctly. Proper form can help to prevent the condition from happening again. General instructions  If you are overweight, ask your health care provider for help with weight loss. Losing even a small amount of weight can help to improve your diastasis recti.  Take  over-the-counter or prescription medicines only as told by your health care provider.  Do not strain. Straining can make the separation worse. Examples of straining include: ? Pushing hard to have a bowel movement, such as due to constipation. ? Lifting heavy objects, including children. ? Standing up and sitting down.  Take steps to prevent constipation: ? Drink enough fluid to keep your urine clear or pale yellow. ? Take over-the-counter or prescription medicines only as directed. ? Eat foods that are high in fiber, such as fresh fruits and vegetables, whole grains, and beans. ? Limit foods that are high in fat and processed sugars, such as fried and sweet foods. Contact a health care provider if:  You notice a new bulge in your abdomen. Get help right away if:  You experience severe discomfort in your abdomen.  You develop severe abdominal pain along with nausea, vomiting, or fever. Summary  Diastasis recti is when the abdomen (abdominal) muscles become thin and separate. Your abdomen will stick out because the space between your right and left abdomen muscles has widened.  The most common symptom is a bulge in your abdomen. You will notice it most when you sit up or are straining.  This condition is diagnosed during a physical exam.  If the abdomen separation is not too big, you may choose not to have treatment. Otherwise, you may need to undergo physical therapy or surgery. This information is not intended to replace advice given to you by your health care provider. Make sure you discuss any questions you have with your health care provider. Document Released: 10/07/2016 Document Revised: 10/07/2016 Document Reviewed: 10/07/2016 Elsevier Interactive Patient Education  [employer].

## 2018-10-17 LAB — BASIC METABOLIC PANEL
BUN / CREAT RATIO: 13 (ref 10–24)
BUN: 14 mg/dL (ref 8–27)
CALCIUM: 9.8 mg/dL (ref 8.6–10.2)
CHLORIDE: 102 mmol/L (ref 96–106)
CO2: 26 mmol/L (ref 20–29)
Creatinine, Ser: 1.06 mg/dL (ref 0.76–1.27)
GFR, EST AFRICAN AMERICAN: 86 mL/min/{1.73_m2} (ref >59)
GFR, EST NON AFRICAN AMERICAN: 74 mL/min/{1.73_m2} (ref >59)
Glucose: 117 mg/dL — ABNORMAL HIGH (ref 65–99)
POTASSIUM: 4.2 mmol/L (ref 3.5–5.2)
SODIUM: 144 mmol/L (ref 134–144)

## 2018-10-17 LAB — LIPID PANEL
CHOL/HDL RATIO: 8.1 ratio — AB (ref 0.0–5.0)
Cholesterol, Total: 266 mg/dL — ABNORMAL HIGH (ref 100–199)
HDL: 33 mg/dL — AB (ref >39)
LDL Calculated: 169 mg/dL — ABNORMAL HIGH (ref 0–99)
Triglycerides: 320 mg/dL — ABNORMAL HIGH (ref 0–149)
VLDL Cholesterol Cal: 64 mg/dL — ABNORMAL HIGH (ref 5–40)

## 2018-10-17 LAB — PSA: PROSTATE SPECIFIC AG, SERUM: 1.3 ng/mL (ref 0.0–4.0)

## 2018-10-19 ENCOUNTER — Other Ambulatory Visit: Payer: Self-pay | Admitting: Family Medicine

## 2018-12-09 ENCOUNTER — Telehealth: Payer: Self-pay | Admitting: Neurology

## 2018-12-09 NOTE — Telephone Encounter (Signed)
Called the patient to inform them that our office has placed new protocols in place for our office visits. Due to Covid 19 our office is reducing our number of office visits in order to minimize the risk to our patients and healthcare providers.Our office is now providing the capability to offer the patients virtual visits at this time.  There was no answer. LVM for the patient to call back to discuss further. 

## 2018-12-10 NOTE — Telephone Encounter (Signed)
Called a 2nd time. No answer. LVM for him to call back.

## 2018-12-11 NOTE — Telephone Encounter (Signed)
Pt called in and stated he didn't want tele or video visit he just wanted to cancel

## 2018-12-16 ENCOUNTER — Institutional Professional Consult (permissible substitution): Payer: BLUE CROSS/BLUE SHIELD | Admitting: Neurology

## 2018-12-23 ENCOUNTER — Telehealth: Payer: Self-pay

## 2018-12-23 NOTE — Telephone Encounter (Signed)
FYI, patient cancelled neurology referral appointment.

## 2019-01-09 ENCOUNTER — Other Ambulatory Visit: Payer: Self-pay | Admitting: Family Medicine

## 2019-01-09 DIAGNOSIS — Z720 Tobacco use: Secondary | ICD-10-CM

## 2019-01-15 ENCOUNTER — Other Ambulatory Visit: Payer: Self-pay

## 2019-01-19 ENCOUNTER — Ambulatory Visit: Payer: BLUE CROSS/BLUE SHIELD | Admitting: Family Medicine

## 2019-01-20 ENCOUNTER — Other Ambulatory Visit: Payer: Self-pay

## 2019-01-21 ENCOUNTER — Ambulatory Visit (INDEPENDENT_AMBULATORY_CARE_PROVIDER_SITE_OTHER): Payer: BLUE CROSS/BLUE SHIELD | Admitting: Family Medicine

## 2019-01-21 DIAGNOSIS — N401 Enlarged prostate with lower urinary tract symptoms: Secondary | ICD-10-CM | POA: Diagnosis not present

## 2019-01-21 DIAGNOSIS — R351 Nocturia: Secondary | ICD-10-CM

## 2019-01-21 DIAGNOSIS — L0292 Furuncle, unspecified: Secondary | ICD-10-CM

## 2019-01-21 DIAGNOSIS — E118 Type 2 diabetes mellitus with unspecified complications: Secondary | ICD-10-CM | POA: Diagnosis not present

## 2019-01-21 MED ORDER — AMOXICILLIN-POT CLAVULANATE 875-125 MG PO TABS: 1.0000 | ORAL_TABLET | Freq: Two times a day (BID) | ORAL | 0 refills | Status: DC

## 2019-01-21 MED ORDER — TADALAFIL 2.5 MG PO TABS: 2.5000 mg | ORAL_TABLET | Freq: Every day | ORAL | 12 refills | Status: DC

## 2019-01-21 NOTE — Progress Notes (Signed)
Telephone visit  Subjective: CC: Follow-up diabetes PCP: Raliegh Ip, DO HUD:JSHF Randall Hall is a 63 y.o. male calls for telephone consult today. Patient provides verbal consent for consult held via phone.  Location of patient: truck Location of provider: [facility] Others present for call: none  1. Type 2 Diabetes with hyperlipidemia:  Patient reports he has been increasing physical activity.  He is doing more active stuff outside and working with horses.  He reports he has lost 3 pounds.  He reports primarily eating fish, vegetables and chicken primarily.  He reports compliance with metformin and Lipitor. Last eye exam: needs Last foot exam: needs Last A1c:  Lab Results  Component Value Date   HGBA1C 6.6 10/16/2018   Nephropathy screen indicated?: needs Last flu, zoster and/or pneumovax: Needs PNA vaccine   There is no immunization history on file for this patient.  ROS:  No chest pain, shortness of breath, numbness and tingling in extremities. Denies polydipsia.  He continues to have nocturia and poor urine flow during the daytime.  He had a PSA done at last visit which was within normal range.  He has a severe allergic reaction to sulfa where he became anaphylactic.  He also reports erectile dysfunction.  He would be interested in starting a medication that would help with both of these issues.    2.  Boil Patient reports a boil on the inner left thigh that seems to recur.  He thinks it may be an infected hair follicle but is not sure.  He did express some material from the lesion.  No significant erythema or swelling.   Allergies  Allergen Reactions  . Sulfa Antibiotics Anaphylaxis    hives swelling   Past Medical History:  Diagnosis Date  . AKI (acute kidney injury) (HCC) 07/14/2016  . CAD (coronary artery disease)   . History of myocardial infarction   . Hx of CABG   . Hyperlipemia   . MI (myocardial infarction) (HCC)   . Tobacco abuse     Current Outpatient  Medications:  .  aspirin EC 81 MG tablet, Take 1 tablet (81 mg total) by mouth daily., Disp: 90 tablet, Rfl: 3 .  atorvastatin (LIPITOR) 40 MG tablet, Take 1 tablet (40 mg total) by mouth daily at 6 PM., Disp: 90 tablet, Rfl: 1 .  b complex vitamins tablet, Take 1 tablet by mouth.  , Disp: , Rfl:  .  buPROPion (WELLBUTRIN XL) 150 MG 24 hr tablet, TAKE 1 TABLET BY MOUTH EVERY DAY, Disp: 90 tablet, Rfl: 0 .  fish oil-omega-3 fatty acids 1000 MG capsule, Take 1 capsule by mouth daily. , Disp: , Rfl:  .  GARLIC PO, Take 1 tablet by mouth daily., Disp: , Rfl:  .  metFORMIN (GLUCOPHAGE) 500 MG tablet, Take 1 tablet (500 mg total) by mouth 2 (two) times daily with a meal., Disp: 180 tablet, Rfl: 1 .  nitroGLYCERIN (NITROSTAT) 0.4 MG SL tablet, PLACE 1 TABLET (0.4 MG TOTAL) UNDER THE TONGUE EVERY 5 (FIVE) MINUTES AS NEEDED FOR CHEST PAIN. (Patient not taking: Reported on 10/16/2018), Disp: 25 tablet, Rfl: 0 .  VITAMIN E PO, Take 1 capsule by mouth daily., Disp: , Rfl:   Assessment/ Plan: 63 y.o. male   1. Benign prostatic hyperplasia with nocturia With nocturia and poor flow.  He has anaphylaxis to sulfa antibiotics and therefore the Flomax class of medications were not pursued.  He does have some erectile dysfunction and therefore I have elected to prescribe daily  Cialis.  I cautioned him against use of this along with the nitroglycerin and we discussed the severe/life-threatening reaction that can occur in combination.  He has not used this medication in over 15 years and therefore I think that he is likely low risk.  He will follow-up in 3 months, this appointment has been scheduled and he is aware of appointment date and time. - Tadalafil 2.5 MG TABS; Take 1 tablet (2.5 mg total) by mouth daily.  Dispense: 30 each; Refill: 12  2. Type 2 diabetes mellitus with complication, without long-term current use of insulin (HCC) Doing well with current medications.  Continue Lipitor and metformin.  We will plan  to check urine microalbumin, perform diabetic foot exam and administer pneumococcal vaccination at next visit.  We will need to update his A1c and if it is less than 6, we can consider trial off of the metformin.  He seems very motivated to minimize medications.  3. Boil Sounds like an infected hair follicle.  I will place him on Augmentin to cover for infection.  We discussed that if this recurs we may need to have him be evaluated. - amoxicillin-clavulanate (AUGMENTIN) 875-125 MG tablet; Take 1 tablet by mouth 2 (two) times daily.  Dispense: 20 tablet; Refill: 0   Start time: 1:35pm End time: 1:51pm  Total time spent on patient care (including telephone call/ virtual visit): 26 minutes  Ashly Hulen SkainsM Gottschalk, DO [facility] [phone]

## 2019-01-21 NOTE — Patient Instructions (Signed)

## 2019-04-08 ENCOUNTER — Other Ambulatory Visit: Payer: Self-pay | Admitting: Family Medicine

## 2019-04-08 DIAGNOSIS — Z720 Tobacco use: Secondary | ICD-10-CM

## 2019-04-08 DIAGNOSIS — I25700 Atherosclerosis of coronary artery bypass graft(s), unspecified, with unstable angina pectoris: Secondary | ICD-10-CM

## 2019-04-08 DIAGNOSIS — E1169 Type 2 diabetes mellitus with other specified complication: Secondary | ICD-10-CM

## 2019-04-09 ENCOUNTER — Ambulatory Visit: Payer: BLUE CROSS/BLUE SHIELD | Admitting: Family Medicine

## 2019-04-09 ENCOUNTER — Ambulatory Visit: Payer: Self-pay | Admitting: Family Medicine

## 2019-05-10 ENCOUNTER — Other Ambulatory Visit: Payer: Self-pay

## 2019-05-11 ENCOUNTER — Encounter: Payer: Self-pay | Admitting: Family Medicine

## 2019-05-11 ENCOUNTER — Ambulatory Visit: Payer: BLUE CROSS/BLUE SHIELD | Admitting: Family Medicine

## 2019-05-11 VITALS — BP 137/88 | HR 72 | Temp 98.0°F | Resp 16 | Ht 66.0 in | Wt 261.2 lb

## 2019-05-11 DIAGNOSIS — Z1159 Encounter for screening for other viral diseases: Secondary | ICD-10-CM | POA: Diagnosis not present

## 2019-05-11 DIAGNOSIS — E1169 Type 2 diabetes mellitus with other specified complication: Secondary | ICD-10-CM

## 2019-05-11 DIAGNOSIS — Z2821 Immunization not carried out because of patient refusal: Secondary | ICD-10-CM

## 2019-05-11 DIAGNOSIS — E118 Type 2 diabetes mellitus with unspecified complications: Secondary | ICD-10-CM

## 2019-05-11 DIAGNOSIS — E785 Hyperlipidemia, unspecified: Secondary | ICD-10-CM | POA: Diagnosis not present

## 2019-05-11 DIAGNOSIS — I25700 Atherosclerosis of coronary artery bypass graft(s), unspecified, with unstable angina pectoris: Secondary | ICD-10-CM

## 2019-05-11 DIAGNOSIS — L0292 Furuncle, unspecified: Secondary | ICD-10-CM

## 2019-05-11 LAB — BAYER DCA HB A1C WAIVED: HB A1C (BAYER DCA - WAIVED): 7.4 % — ABNORMAL HIGH (ref <7.0)

## 2019-05-11 MED ORDER — METFORMIN HCL 500 MG PO TABS: 500.0000 mg | ORAL_TABLET | Freq: Two times a day (BID) | ORAL | 1 refills | Status: DC

## 2019-05-11 MED ORDER — AMOXICILLIN-POT CLAVULANATE 875-125 MG PO TABS: 1.0000 | ORAL_TABLET | Freq: Two times a day (BID) | ORAL | 0 refills | Status: DC

## 2019-05-11 NOTE — Patient Instructions (Addendum)
Your sugar has gone back up.  A1c was 7.4 today.  This is UNCONTROLLED diabetes.  Start back on the Metformin twice daily.  Refills sent.  See me in 3 months.  Get your eye exam done.  Try Dr Marin Comment at [facility].  Often she is very accommodating and can see you same day.  Head over now.  I think she closes at 430.  I have placed a referral to the specialist to cut out the boil that keeps coming back.  Antibiotic also sent.  You had labs performed today.  You will be contacted with the results of the labs once they are available, usually in the next 3 business days for routine lab work.  If you have an active my chart account, they will be released to your MyChart.  If you prefer to have these labs released to you via telephone, please let us know.  If you had a pap smear or biopsy performed, expect to be contacted in about 7-10 days.  Skin Abscess  A skin abscess is an infected area of your skin that contains pus and other material. An abscess can happen in any part of your body. Some abscesses break open (rupture) on their own. Most continue to get worse unless they are treated. The infection can spread deeper into the body and into your blood, which can make you feel sick. A skin abscess is caused by germs that enter the skin through a cut or scrape. It can also be caused by blocked oil and sweat glands or infected hair follicles. This condition is usually treated by:  Draining the pus.  Taking antibiotic medicines.  Placing a warm, wet washcloth over the abscess. Follow these instructions at home: Medicines   Take over-the-counter and prescription medicines only as told by your doctor.  If you were prescribed an antibiotic medicine, take it as told by your doctor. Do not stop taking the antibiotic even if you start to feel better. Abscess care   If you have an abscess that has not drained, place a warm, clean, wet washcloth over the abscess several times a day. Do this as told by your  doctor.  Follow instructions from your doctor about how to take care of your abscess. Make sure you: ? Cover the abscess with a bandage (dressing). ? Change your bandage or gauze as told by your doctor. ? Wash your hands with soap and water before you change the bandage or gauze. If you cannot use soap and water, use hand sanitizer.  Check your abscess every day for signs that the infection is getting worse. Check for: ? More redness, swelling, or pain. ? More fluid or blood. ? Warmth. ? More pus or a bad smell. General instructions  To avoid spreading the infection: ? Do not share personal care items, towels, or hot tubs with others. ? Avoid making skin-to-skin contact with other people.  Keep all follow-up visits as told by your doctor. This is important. Contact a doctor if:  You have more redness, swelling, or pain around your abscess.  You have more fluid or blood coming from your abscess.  Your abscess feels warm when you touch it.  You have more pus or a bad smell coming from your abscess.  You have a fever.  Your muscles ache.  You have chills.  You feel sick. Get help right away if:  You have very bad (severe) pain.  You see red streaks on your skin spreading away from  the abscess. Summary  A skin abscess is an infected area of your skin that contains pus and other material.  The abscess is caused by germs that enter the skin through a cut or scrape. It can also be caused by blocked oil and sweat glands or infected hair follicles.  Follow your doctor's instructions on caring for your abscess, taking medicines, preventing infections, and keeping follow-up visits. This information is not intended to replace advice given to you by your health care provider. Make sure you discuss any questions you have with your health care provider. Document Released: 01/29/2008 Document Revised: 12/03/2018 Document Reviewed: 09/25/2017 Elsevier Patient Education  2020  [employer]

## 2019-05-11 NOTE — Progress Notes (Signed)
Subjective: CC: DM2, HLD PCP: Janora Norlander, DO QIO:NGEX Koskinen is a 63 y.o. male presenting to clinic today for:  1. Type 2 Diabetes w/ HLD:  Patient reports that " his metformin got stolen about 2 months ago".  He is since been out of the medication and not think to call in for a new prescription.  He reports compliance with atorvastatin.  Last eye exam: Has not yet scheduled Last foot exam: Need Last A1c:  Lab Results  Component Value Date   HGBA1C 6.6 10/16/2018   Nephropathy screen indicated?:  Needs Last flu, zoster and/or pneumovax:  There is no immunization history on file for this patient.  ROS: Denies dizziness, LOC, polyuria, polydipsia, unintended weight loss/gain, foot ulcerations, numbness or tingling in extremities, shortness of breath or chest pain.  He continues to have recurrent boil in the left inner thigh that did improve after use of Augmentin.  He would like to have this excised if possible.  2.  BPH Patient reports improvement in nocturia symptoms with Cialis daily.  Denies any adverse side effects.  Urine flow is good.  He is only waking up perhaps 1 time per night to urinate.   ROS: Per HPI  Allergies  Allergen Reactions  . Sulfa Antibiotics Anaphylaxis    hives swelling   Past Medical History:  Diagnosis Date  . AKI (acute kidney injury) ([place]) 07/14/2016  . CAD (coronary artery disease)   . History of myocardial infarction   . Hx of CABG   . Hyperlipemia   . MI (myocardial infarction) ([place])   . Tobacco abuse     Current Outpatient Medications:  .  aspirin EC 81 MG tablet, Take 1 tablet (81 mg total) by mouth daily., Disp: 90 tablet, Rfl: 3 .  atorvastatin (LIPITOR) 40 MG tablet, TAKE 1 TABLET (40 MG TOTAL) BY MOUTH DAILY AT 6 PM., Disp: 90 tablet, Rfl: 0 .  b complex vitamins tablet, Take 1 tablet by mouth.  , Disp: , Rfl:  .  buPROPion (WELLBUTRIN XL) 150 MG 24 hr tablet, TAKE 1 TABLET BY MOUTH EVERY DAY, Disp: 90 tablet, Rfl: 0 .   fish oil-omega-3 fatty acids 1000 MG capsule, Take 1 capsule by mouth daily. , Disp: , Rfl:  .  GARLIC PO, Take 1 tablet by mouth daily., Disp: , Rfl:  .  nitroGLYCERIN (NITROSTAT) 0.4 MG SL tablet, PLACE 1 TABLET (0.4 MG TOTAL) UNDER THE TONGUE EVERY 5 (FIVE) MINUTES AS NEEDED FOR CHEST PAIN., Disp: 25 tablet, Rfl: 0 .  Tadalafil 2.5 MG TABS, Take 1 tablet (2.5 mg total) by mouth daily., Disp: 30 each, Rfl: 12 .  VITAMIN E PO, Take 1 capsule by mouth daily., Disp: , Rfl:  .  metFORMIN (GLUCOPHAGE) 500 MG tablet, Take 1 tablet (500 mg total) by mouth 2 (two) times daily with a meal. (Patient not taking: Reported on 05/11/2019), Disp: 180 tablet, Rfl: 1 Social History   Socioeconomic History  . Marital status: Married    Spouse name: Not on file  . Number of children: 4  . Years of education: Not on file  . Highest education level: Not on file  Occupational History  . Occupation: TRUCK DRIVER    Employer: [employer]  Social Needs  . Financial resource strain: Not on file  . Food insecurity    Worry: Not on file    Inability: Not on file  . Transportation needs    Medical: Not on file  Non-medical: Not on file  Tobacco Use  . Smoking status: Current Every Day Smoker    Packs/day: 1.00    Years: 21.00    Pack years: 21.00    Types: Cigarettes  . Smokeless tobacco: Never Used  Substance and Sexual Activity  . Alcohol use: Yes  . Drug use: No    Types: Marijuana  . Sexual activity: Not on file  Lifestyle  . Physical activity    Days per week: Not on file    Minutes per session: Not on file  . Stress: Not on file  Relationships  . Social Herbalist on phone: Not on file    Gets together: Not on file    Attends religious service: Not on file    Active member of club or organization: Not on file    Attends meetings of clubs or organizations: Not on file    Relationship status: Not on file  . Intimate partner violence    Fear of current or ex partner:  Not on file    Emotionally abused: Not on file    Physically abused: Not on file    Forced sexual activity: Not on file  Other Topics Concern  . Not on file  Social History Narrative   ** Merged History Encounter **       ** Data from: 12/28/16 Enc Dept: [place] DEPT   Married.  He has seven grandchildren.        ** Data from: 03/23/17 Enc Dept: [place]   Lives with wife   Family History  Problem Relation Age of Onset  . Diabetes Mother   . CAD Father 83       Died  . CAD Brother     Objective: Office vital signs reviewed. BP 137/88   Pulse 72   Temp 98 F (36.7 C) (Temporal)   Resp 16   Ht 5' 6" (1.676 m)   Wt 261 lb 3.2 oz (118.5 kg)   SpO2 95%   BMI 42.16 kg/m   Physical Examination:  General: Awake, alert, obese, No acute distress HEENT: Normal, sclera white, MMM Cardio: regular rate and rhythm, S1S2 heard, no murmurs appreciated; well healed post surgical scar in midline of chest Pulm: clear to auscultation bilaterally, no wheezes, rhonchi or rales; normal work of breathing on room air Extremities: warm, well perfused, No edema, cyanosis or clubbing; +2 pulses bilaterally Neuro: see DM foot Diabetic Foot Exam - Simple   Simple Foot Form Diabetic Foot exam was performed with the following findings: Yes 05/11/2019  3:18 PM  Visual Inspection No deformities, no ulcerations, no other skin breakdown bilaterally: Yes Sensation Testing Intact to touch and monofilament testing bilaterally: Yes Pulse Check Posterior Tibialis and Dorsalis pulse intact bilaterally: Yes Comments    Assessment/ Plan: 63 y.o. male   1. Type 2 diabetes mellitus with complication, without long-term current use of insulin ([place]) Uncontrolled now with A1c of 7.1.  I suspect this is related to noncompliance with medication.  I renewed his metformin and encouraged him to follow-up in 3 months.  Urine microalbumin obtained today.  Flu vaccine declined.  He will schedule his  diabetic eye exam - Bayer DCA Hb A1c Waived - Microalbumin / creatinine urine ratio - CMP14+EGFR - metFORMIN (GLUCOPHAGE) 500 MG tablet; Take 1 tablet (500 mg total) by mouth 2 (two) times daily with a meal.  Dispense: 180 tablet; Refill: 1  2. Hyperlipidemia associated with type 2 diabetes mellitus (  [place]) Check LDL - LDL Cholesterol, Direct - CMP14+EGFR  3. Encounter for hepatitis C screening test for low risk patient - Hepatitis C antibody  4. Boil Empiric treatment with Augmentin and referral to general surgery for excision. - amoxicillin-clavulanate (AUGMENTIN) 875-125 MG tablet; Take 1 tablet by mouth 2 (two) times daily.  Dispense: 20 tablet; Refill: 0 - Ambulatory referral to General Surgery  5. Coronary artery disease involving coronary bypass graft of native heart with unstable angina pectoris (HCC) Stable.  Asymptomatic  6. Refused influenza vaccine Counseling.   Orders Placed This Encounter  Procedures  . Bayer DCA Hb A1c Waived  . Microalbumin / creatinine urine ratio  . Hepatitis C antibody  . LDL Cholesterol, Direct  . CMP14+EGFR   Meds ordered this encounter  Medications  . amoxicillin-clavulanate (AUGMENTIN) 875-125 MG tablet    Sig: Take 1 tablet by mouth 2 (two) times daily.    Dispense:  20 tablet    Refill:  0  . metFORMIN (GLUCOPHAGE) 500 MG tablet    Sig: Take 1 tablet (500 mg total) by mouth 2 (two) times daily with a meal.    Dispense:  180 tablet    Refill:  [place], DO [place] [phone]

## 2019-05-12 LAB — CMP14+EGFR
ALT: 21 IU/L (ref 0–44)
AST: 16 IU/L (ref 0–40)
Albumin/Globulin Ratio: 1.6 (ref 1.2–2.2)
Albumin: 4.1 g/dL (ref 3.8–4.8)
Alkaline Phosphatase: 85 IU/L (ref 39–117)
BUN/Creatinine Ratio: 15 (ref 10–24)
BUN: 17 mg/dL (ref 8–27)
Bilirubin Total: 0.3 mg/dL (ref 0.0–1.2)
CO2: 27 mmol/L (ref 20–29)
Calcium: 9.7 mg/dL (ref 8.6–10.2)
Chloride: 103 mmol/L (ref 96–106)
Creatinine, Ser: 1.17 mg/dL (ref 0.76–1.27)
GFR calc Af Amer: 76 mL/min/{1.73_m2} (ref >59)
GFR calc non Af Amer: 66 mL/min/{1.73_m2} (ref >59)
Globulin, Total: 2.5 g/dL (ref 1.5–4.5)
Glucose: 138 mg/dL — ABNORMAL HIGH (ref 65–99)
Potassium: 4.4 mmol/L (ref 3.5–5.2)
Sodium: 142 mmol/L (ref 134–144)
Total Protein: 6.6 g/dL (ref 6.0–8.5)

## 2019-05-12 LAB — LDL CHOLESTEROL, DIRECT: LDL Direct: 95 mg/dL (ref 0–99)

## 2019-05-12 LAB — HEPATITIS C ANTIBODY: Hep C Virus Ab: <0.1 s/co ratio (ref 0.0–0.9)

## 2019-05-13 LAB — MICROALBUMIN / CREATININE URINE RATIO
Creatinine, Urine: 163.7 mg/dL
Microalb/Creat Ratio: 7 mg/g creat (ref 0–29)
Microalbumin, Urine: 11.5 ug/mL

## 2019-06-15 ENCOUNTER — Other Ambulatory Visit: Payer: Self-pay

## 2019-06-15 ENCOUNTER — Encounter: Payer: Self-pay | Admitting: General Surgery

## 2019-06-15 ENCOUNTER — Ambulatory Visit: Payer: BLUE CROSS/BLUE SHIELD | Admitting: General Surgery

## 2019-06-15 VITALS — BP 130/80 | HR 64 | Temp 97.1°F | Resp 18 | Ht 66.0 in | Wt 258.0 lb

## 2019-06-15 DIAGNOSIS — L723 Sebaceous cyst: Secondary | ICD-10-CM | POA: Insufficient documentation

## 2019-06-15 HISTORY — DX: Sebaceous cyst: L72.3

## 2019-06-15 NOTE — Progress Notes (Signed)
[facility] History and Physical  Reason for Referral: Left leg "boil" cyst  Referring Physician:  Gottschalk, Ashly M, DO      Chief Complaint    Recurrent Skin Infections      Randall Hall is a 63 y.o. male.  HPI: [name] is a 63 yo with a history of CAD, CABG in 2004, subsequent cardiac catheterizations, and he reports a history of multiple episodes of having swelling and pain in the left inner thigh. He says that he has drained this at home with a needle and has been able to express material from the area.  He says his wife is able to dress the area, and reports that this has occurred multiple times. He reports that it will get infected probably every month or so.  He reports that currently the left leg area is much improved. He had been given some Augmentin but did not feel that this helped. He says that the area when infected becomes twice as large and very tender.   He is here to discuss removal of what sounds like a recurrently infected sebaceous cyst.      Past Medical History:  Diagnosis Date  . AKI (acute kidney injury) (HCC) 07/14/2016  . CAD (coronary artery disease)   . History of myocardial infarction   . Hx of CABG   . Hyperlipemia   . MI (myocardial infarction) (HCC)   . Tobacco abuse          Past Surgical History:  Procedure Laterality Date  . CARDIAC CATHETERIZATION N/A 07/16/2016   Procedure: Left Heart Cath and Cors/Grafts Angiography;  Surgeon: Christopher D McAlhany, MD;  Location: [facility];  Service: Cardiovascular;  Laterality: N/A;  . CARDIAC CATHETERIZATION N/A 07/16/2016   Procedure: Coronary Stent Intervention;  Surgeon: Christopher D McAlhany, MD;  Location: [facility];  Service: Cardiovascular;  Laterality: N/A;  . CORONARY ANGIOPLASTY WITH STENT PLACEMENT    . CORONARY ARTERY BYPASS GRAFT     2004 Dr. Bartle LIMA to the LAD, SVG to PDA and posterior lateral, SVG to diagonal, RIMA  to obtuse marginal.  Last catheterization 2008.         Family History  Problem Relation Age of Onset  . Diabetes Mother   . CAD Father 51       Died  . CAD Brother     Social History        Tobacco Use  . Smoking status: Current Every Day Smoker    Packs/day: 1.00    Years: 21.00    Pack years: 21.00    Types: Cigarettes  . Smokeless tobacco: Never Used  Substance Use Topics  . Alcohol use: Yes  . Drug use: No    Types: Marijuana    Medications: I have reviewed the patient's current medications.      Allergies as of 06/15/2019      Reactions   Sulfa Antibiotics Anaphylaxis   hives swelling         Medication List       Accurate as of June 15, 2019  9:30 AM. If you have any questions, ask your nurse or doctor.        amoxicillin-clavulanate 875-125 MG tablet Commonly known as: AUGMENTIN Take 1 tablet by mouth 2 (two) times daily.   aspirin EC 81 MG tablet Take 1 tablet (81 mg total) by mouth daily.   atorvastatin 40 MG tablet Commonly known as: LIPITOR TAKE 1 TABLET (40   MG TOTAL) BY MOUTH DAILY AT 6 PM.   b complex vitamins tablet Take 1 tablet by mouth.   buPROPion 150 MG 24 hr tablet Commonly known as: WELLBUTRIN XL TAKE 1 TABLET BY MOUTH EVERY DAY   fish oil-omega-3 fatty acids 1000 MG capsule Take 1 capsule by mouth daily.   GARLIC PO Take 1 tablet by mouth daily.   metFORMIN 500 MG tablet Commonly known as: GLUCOPHAGE Take 1 tablet (500 mg total) by mouth 2 (two) times daily with a meal.   nitroGLYCERIN 0.4 MG SL tablet Commonly known as: NITROSTAT PLACE 1 TABLET (0.4 MG TOTAL) UNDER THE TONGUE EVERY 5 (FIVE) MINUTES AS NEEDED FOR CHEST PAIN.   Tadalafil 2.5 MG Tabs Take 1 tablet (2.5 mg total) by mouth daily.   VITAMIN E PO Take 1 capsule by mouth daily.        ROS:  A comprehensive review of systems was negative except for: Genitourinary: positive for frequency  Integument/breast: positive for "boils"  Blood pressure 130/80, pulse 64, temperature (!) 97.1 F (36.2 C), temperature source Tympanic, resp. rate 18, height 5' 6" (1.676 m), weight 258 lb (117 kg), SpO2 94 %. Physical Exam Vitals signs reviewed.  Constitutional:      Appearance: Normal appearance.  HENT:     Head: Normocephalic.     Nose: Nose normal.     Mouth/Throat:     Mouth: Mucous membranes are moist.  Eyes:     Extraocular Movements: Extraocular movements intact.     Pupils: Pupils are equal, round, and reactive to light.  Neck:     Musculoskeletal: Normal range of motion.  Cardiovascular:     Rate and Rhythm: Normal rate and regular rhythm.  Pulmonary:     Effort: Pulmonary effort is normal.     Breath sounds: Normal breath sounds.  Abdominal:     General: There is no distension.     Palpations: Abdomen is soft.     Tenderness: There is no abdominal tenderness.  Musculoskeletal: Normal range of motion.        General: No swelling.     Comments: Left inner thigh with skin discoloration, area of central pit noted, superficial area that feels mobile, nontender, [place] at bedside with possible 1.5cm fluid filled flattened cystic structure just under the skin  Skin:    General: Skin is warm and dry.  Neurological:     General: No focal deficit present.     Mental Status: He is alert and oriented to person, place, and time.  Psychiatric:        Mood and Affect: Mood normal.        Behavior: Behavior normal.        Thought Content: Thought content normal.        Judgment: Judgment normal.     Results: None   Assessment & Plan:  Randall Hall is a 63 y.o. male with what sounds like a recurrently infected cyst that he has been draining at home. The patient has been having monthly issues with this becoming infected and needing to be drained. He does not think the Augmentin helped with this last episode.  He denies any current changes in his cardiac symptoms. He denies  any chest pain or SOB. He has not seen Dr. Hochrein, cardiologist, this year, and last saw him in 06/2018 when everything was reported as stable.   -Excision of cyst from left inner thigh -We discussed the risk of bleeding, infection, recurrence, need for   packing the wound, risk of dehiscence of the wound.  -Plan for excision with local anesthesia  -Patient offered option of excision with sedation, but we would want to hold until Dr. Hochrein was able to see him given need for anesthesia. He wants to pursue excision with local anesthesia only at this time.   All questions were answered to the satisfaction of the patient.    Mehar Sagen C Kinesha Auten 06/15/2019, 9:30 AM       

## 2019-06-15 NOTE — Patient Instructions (Signed)
Epidermal Cyst (Sebaceous cyst)  An epidermal cyst is a small, painless lump under your skin. The cyst contains a grayish-white, bad-smelling substance (keratin). Do not try to pop or open an epidermal cyst yourself. What are the causes?  A blocked hair follicle.  A hair that curls and re-enters the skin instead of growing straight out of the skin.  A blocked pore.  Irritated skin.  An injury to the skin.  Certain conditions that are passed along from parent to child (inherited).  Human papillomavirus (HPV).  Long-term sun damage to the skin. What increases the risk?  Having acne.  Being overweight.  Being 2-67 years old. What are the signs or symptoms? These cysts are usually harmless, but they can get infected. Symptoms of infection may include:  Redness.  Inflammation.  Tenderness.  Warmth.  Fever.  A grayish-white, bad-smelling substance drains from the cyst.  Pus drains from the cyst. How is this treated? In many cases, epidermal cysts go away on their own without treatment. If a cyst becomes infected, treatment may include:  Opening and draining the cyst, done by a doctor. After draining, you may need minor surgery to remove the rest of the cyst.  Antibiotic medicine.  Shots of medicines (steroids) that help to reduce inflammation.  Surgery to remove the cyst. Surgery may be done if the cyst: ? Becomes large. ? Bothers you. ? Has a chance of turning into cancer.  Do not try to open a cyst yourself. Follow these instructions at home:  Take over-the-counter and prescription medicines only as told by your doctor.  If you were prescribed an antibiotic medicine, take it it as told by your doctor. Do not stop using the antibiotic even if you start to feel better.  Keep the area around your cyst clean and dry.  Wear loose, dry clothing.  Avoid touching your cyst.  Check your cyst every day for signs of infection. Check for: ? Redness, swelling,  or pain. ? Fluid or blood. ? Warmth. ? Pus or a bad smell.  Keep all follow-up visits as told by your doctor. This is important. How is this prevented?  Wear clean, dry, clothing.  Avoid wearing tight clothing.  Keep your skin clean and dry. Take showers or baths every day. Contact a doctor if:  Your cyst has symptoms of infection.  Your condition does not improve or gets worse.  You have a cyst that looks different from other cysts you have had.  You have a fever. Get help right away if:  Redness spreads from the cyst into the area close by. Summary  An epidermal cyst is a sac made of skin tissue.  If a cyst becomes infected, treatment may include surgery to open and drain the cyst, or to remove it.  Take over-the-counter and prescription medicines only as told by your doctor.  Contact a doctor if your condition is not improving or is getting worse.  Keep all follow-up visits as told by your doctor. This is important. This information is not intended to replace advice given to you by your health care provider. Make sure you discuss any questions you have with your health care provider. Document Released: 09/19/2004 Document Revised: 12/03/2018 Document Reviewed: 05/21/2018 Elsevier Patient Education  2020 [place].   Epidermal Cyst Removal  Epidermal cyst removal is a procedure to remove a sac of oily material (keratin) that has formed under your skin (epidermal cyst). Epidermal cysts may also be called epidermoid cysts, sebaceous cysts,  or keratin cysts. Normally, the skin secretes this oily material through a gland or a hair follicle. However, when a skin gland or hair follicle becomes blocked, an epidermal cyst can form. You may need this procedure if you have an epidermal cyst that becomes large, uncomfortable, or infected. Tell a health care provider about:  Any allergies you have.  All medicines you are taking, including vitamins, herbs, eye drops, creams,  and over-the-counter medicines.  Any problems you or family members have had with anesthetic medicines.  Any blood disorders you have.  Any surgeries you have had.  Any medical conditions you have now or have had.  Whether you are pregnant or may be pregnant. What are the risks? Generally, this is a safe procedure. However, problems may occur, including:  Development of another cyst.  Bleeding.  Infection.  Scarring. What happens before the procedure?  Ask your health care provider about: ? Changing or stopping your regular medicines. This is especially important if you are taking diabetes medicines or blood thinners. ? Taking medicines such as aspirin and ibuprofen. These medicines can thin your blood. Do not take these medicines unless your health care provider tells you to take them. ? Taking over-the-counter medicines, vitamins, herbs, and supplements.  If you have an infected cyst, you may have to take antibiotic medicine before the cyst removal. Take your antibiotic as told by your health care provider. Do not stop taking the antibiotic even if you start to feel better.  Take a shower on the morning of your procedure. Your health care provider may ask you to use a germ-killing soap. What happens during the procedure?   You will be given a medicine to numb the area (local anesthetic).  The skin around the cyst will be cleaned with a germ-killing solution.  The health care provider will make a small incision in your skin over the cyst.  The health care provider will separate the cyst from the surrounding tissues that are under your skin.  If possible, the cyst will be removed undamaged (intact).  If the cyst bursts (ruptures), it will be removed in pieces.  After the cyst is removed, the health care provider will control any bleeding and close the incision with small stitches (sutures). Small incisions may not need sutures, and the bleeding will be controlled by  applying direct pressure with gauze.  The health care provider may apply antibiotic ointment and a bandage (dressing) over the incision. The procedure may vary among health care providers and hospitals. What happens after the procedure?  If your cyst ruptured during the procedure, you may need an antibiotic. If you are prescribed an antibiotic medicine or ointment, take or apply it as told by your health care provider. Do not stop using the antibiotic even if you start to feel better. Summary  Epidermal cyst removal is a procedure to remove a sac of oily material (keratin) that has formed under your skin (epidermal cyst).  You may need this procedure if you have an epidermal cyst that becomes large, uncomfortable, or infected.  The health care provider will make a small incision in your skin to remove the cyst.  If you are prescribed an antibiotic medicine before the procedure, after the procedure, or both, use the antibiotic as told by your health care provider. Do not stop using the antibiotic even if you start to feel better. This information is not intended to replace advice given to you by your health care provider. Make sure you  discuss any questions you have with your health care provider. Document Released: 08/09/2000 Document Revised: 12/03/2018 Document Reviewed: 06/05/2017 Elsevier Patient Education  2020 [employer].

## 2019-06-20 NOTE — H&P (Signed)
[facility] History and Physical  Reason for Referral: Left leg "boil" cyst  Referring Physician:  Raliegh IpGottschalk, Ashly M, Hall      Chief Complaint    Recurrent Skin Infections      Randall HearingJohn Hall is a 63 y.o. male.  HPI: Randall Hall is a 63 yo with a history of CAD, CABG in 2004, subsequent cardiac catheterizations, and he reports a history of multiple episodes of having swelling and pain in the left inner thigh. He says that he has drained this at home with a needle and has been able to express material from the area.  He says his wife is able to dress the area, and reports that this has occurred multiple times. He reports that it will get infected probably every month or so.  He reports that currently the left leg area is much improved. He had been given some Augmentin but did not feel that this helped. He says that the area when infected becomes twice as large and very tender.   He is here to discuss removal of what sounds like a recurrently infected sebaceous cyst.      Past Medical History:  Diagnosis Date  . AKI (acute kidney injury) (HCC) 07/14/2016  . CAD (coronary artery disease)   . History of myocardial infarction   . Hx of CABG   . Hyperlipemia   . MI (myocardial infarction) (HCC)   . Tobacco abuse          Past Surgical History:  Procedure Laterality Date  . CARDIAC CATHETERIZATION N/A 07/16/2016   Procedure: Left Heart Cath and Cors/Grafts Angiography;  Surgeon: Kathleene Hazelhristopher D McAlhany, MD;  [facility];  Service: Cardiovascular;  Laterality: N/A;  . CARDIAC CATHETERIZATION N/A 07/16/2016   Procedure: Coronary Stent Intervention;  Surgeon: Kathleene Hazelhristopher D McAlhany, MD;  Location: [facility];  Service: Cardiovascular;  Laterality: N/A;  . CORONARY ANGIOPLASTY WITH STENT PLACEMENT    . CORONARY ARTERY BYPASS GRAFT     2004 Dr. Laneta SimmersBartle LIMA to the LAD, SVG to PDA and posterior lateral, SVG to diagonal, RIMA  to obtuse marginal.  Last catheterization 2008.         Family History  Problem Relation Age of Onset  . Diabetes Mother   . CAD Father 1551       Died  . CAD Brother     Social History        Tobacco Use  . Smoking status: Current Every Day Smoker    Packs/day: 1.00    Years: 21.00    Pack years: 21.00    Types: Cigarettes  . Smokeless tobacco: Never Used  Substance Use Topics  . Alcohol use: Yes  . Drug use: No    Types: Marijuana    Medications: I have reviewed the patient's current medications.      Allergies as of 06/15/2019      Reactions   Sulfa Antibiotics Anaphylaxis   hives swelling         Medication List       Accurate as of June 15, 2019  9:30 AM. If you have any questions, ask your nurse or doctor.        amoxicillin-clavulanate 875-125 MG tablet Commonly known as: AUGMENTIN Take 1 tablet by mouth 2 (two) times daily.   aspirin EC 81 MG tablet Take 1 tablet (81 mg total) by mouth daily.   atorvastatin 40 MG tablet Commonly known as: LIPITOR TAKE 1 TABLET (40  MG TOTAL) BY MOUTH DAILY AT 6 PM.   b complex vitamins tablet Take 1 tablet by mouth.   buPROPion 150 MG 24 hr tablet Commonly known as: WELLBUTRIN XL TAKE 1 TABLET BY MOUTH EVERY DAY   fish oil-omega-3 fatty acids 1000 MG capsule Take 1 capsule by mouth daily.   GARLIC PO Take 1 tablet by mouth daily.   metFORMIN 500 MG tablet Commonly known as: GLUCOPHAGE Take 1 tablet (500 mg total) by mouth 2 (two) times daily with a meal.   nitroGLYCERIN 0.4 MG SL tablet Commonly known as: NITROSTAT PLACE 1 TABLET (0.4 MG TOTAL) UNDER THE TONGUE EVERY 5 (FIVE) MINUTES AS NEEDED FOR CHEST PAIN.   Tadalafil 2.5 MG Tabs Take 1 tablet (2.5 mg total) by mouth daily.   VITAMIN E PO Take 1 capsule by mouth daily.        ROS:  A comprehensive review of systems was negative except for: Genitourinary: positive for frequency  Integument/breast: positive for "boils"  Blood pressure 130/80, pulse 64, temperature (!) 97.1 F (36.2 C), temperature source Tympanic, resp. rate 18, height 5\' 6"  (1.676 m), weight 258 lb (117 kg), SpO2 94 %. Physical Exam Vitals signs reviewed.  Constitutional:      Appearance: Normal appearance.  HENT:     Head: Normocephalic.     Nose: Nose normal.     Mouth/Throat:     Mouth: Mucous membranes are moist.  Eyes:     Extraocular Movements: Extraocular movements intact.     Pupils: Pupils are equal, round, and reactive to light.  Neck:     Musculoskeletal: Normal range of motion.  Cardiovascular:     Rate and Rhythm: Normal rate and regular rhythm.  Pulmonary:     Effort: Pulmonary effort is normal.     Breath sounds: Normal breath sounds.  Abdominal:     General: There is no distension.     Palpations: Abdomen is soft.     Tenderness: There is no abdominal tenderness.  Musculoskeletal: Normal range of motion.        General: No swelling.     Comments: Left inner thigh with skin discoloration, area of central pit noted, superficial area that feels mobile, nontender, [place] at bedside with possible 1.5cm fluid filled flattened cystic structure just under the skin  Skin:    General: Skin is warm and dry.  Neurological:     General: No focal deficit present.     Mental Status: He is alert and oriented to person, place, and time.  Psychiatric:        Mood and Affect: Mood normal.        Behavior: Behavior normal.        Thought Content: Thought content normal.        Judgment: Judgment normal.     Results: None   Assessment & Plan:  Randall Hall is a 63 y.o. male with what sounds like a recurrently infected cyst that he has been draining at home. The patient has been having monthly issues with this becoming infected and needing to be drained. He does not think the Augmentin helped with this last episode.  He denies any current changes in his cardiac symptoms. He denies  any chest pain or SOB. He has not seen Dr. Percival Spanish, cardiologist, this year, and last saw him in 06/2018 when everything was reported as stable.   -Excision of cyst from left inner thigh -We discussed the risk of bleeding, infection, recurrence, need for  packing the wound, risk of dehiscence of the wound.  -Plan for excision with local anesthesia  -Patient offered option of excision with sedation, but we would want to hold until Dr. Antoine Poche was able to see him given need for anesthesia. He wants to pursue excision with local anesthesia only at this time.   All questions were answered to the satisfaction of the patient.    Lucretia Roers 06/15/2019, 9:30 AM

## 2019-07-02 ENCOUNTER — Encounter (HOSPITAL_COMMUNITY): Payer: Self-pay | Admitting: *Deleted

## 2019-07-02 ENCOUNTER — Encounter (HOSPITAL_COMMUNITY)
Admission: RE | Disposition: A | Discharge status: Home / Self Care | Payer: Self-pay | Source: Home / Self Care | Attending: General Surgery

## 2019-07-02 ENCOUNTER — Other Ambulatory Visit: Payer: Self-pay

## 2019-07-02 ENCOUNTER — Ambulatory Visit (HOSPITAL_COMMUNITY)
Admission: RE | Admit: 2019-07-02 | Discharge: 2019-07-02 | Disposition: A | Discharge status: Home / Self Care | Payer: BLUE CROSS/BLUE SHIELD | Attending: General Surgery | Admitting: General Surgery

## 2019-07-02 DIAGNOSIS — Z955 Presence of coronary angioplasty implant and graft: Secondary | ICD-10-CM | POA: Insufficient documentation

## 2019-07-02 DIAGNOSIS — Z951 Presence of aortocoronary bypass graft: Secondary | ICD-10-CM | POA: Insufficient documentation

## 2019-07-02 DIAGNOSIS — E785 Hyperlipidemia, unspecified: Secondary | ICD-10-CM | POA: Insufficient documentation

## 2019-07-02 DIAGNOSIS — Z79899 Other long term (current) drug therapy: Secondary | ICD-10-CM | POA: Diagnosis not present

## 2019-07-02 DIAGNOSIS — I2510 Atherosclerotic heart disease of native coronary artery without angina pectoris: Secondary | ICD-10-CM | POA: Diagnosis not present

## 2019-07-02 DIAGNOSIS — L723 Sebaceous cyst: Secondary | ICD-10-CM | POA: Diagnosis present

## 2019-07-02 DIAGNOSIS — F17210 Nicotine dependence, cigarettes, uncomplicated: Secondary | ICD-10-CM | POA: Insufficient documentation

## 2019-07-02 DIAGNOSIS — I252 Old myocardial infarction: Secondary | ICD-10-CM | POA: Diagnosis not present

## 2019-07-02 DIAGNOSIS — Z7984 Long term (current) use of oral hypoglycemic drugs: Secondary | ICD-10-CM | POA: Insufficient documentation

## 2019-07-02 DIAGNOSIS — Z7982 Long term (current) use of aspirin: Secondary | ICD-10-CM | POA: Diagnosis not present

## 2019-07-02 HISTORY — PX: LIPOMA EXCISION: SHX5283

## 2019-07-02 LAB — GLUCOSE, CAPILLARY: Glucose-Capillary: 152 mg/dL — ABNORMAL HIGH (ref 70–99)

## 2019-07-02 SURGERY — MINOR EXCISION LIPOMA
Anesthesia: LOCAL | Laterality: Left

## 2019-07-02 MED ORDER — LIDOCAINE HCL (PF) 1 % IJ SOLN
INTRAMUSCULAR | Status: AC
  Filled 2019-07-02: qty 30

## 2019-07-02 MED ORDER — OXYCODONE HCL 5 MG PO TABS: 5.0000 mg | ORAL_TABLET | ORAL | 0 refills | Status: DC | PRN

## 2019-07-02 MED ORDER — BUPIVACAINE HCL (PF) 0.25 % IJ SOLN
INTRAMUSCULAR | Status: DC | PRN
  Administered 2019-07-02: 10 mL

## 2019-07-02 MED ORDER — DOCUSATE SODIUM 100 MG PO CAPS: 100.0000 mg | ORAL_CAPSULE | Freq: Two times a day (BID) | ORAL | 2 refills | Status: DC

## 2019-07-02 SURGICAL SUPPLY — 21 items
ADH SKN CLS APL DERMABOND .7 (GAUZE/BANDAGES/DRESSINGS)
DECANTER SPIKE VIAL GLASS SM (MISCELLANEOUS) ×1 IMPLANT
DERMABOND ADVANCED (GAUZE/BANDAGES/DRESSINGS)
DERMABOND ADVANCED .7 DNX12 (GAUZE/BANDAGES/DRESSINGS) IMPLANT
DURAPREP 26ML APPLICATOR (WOUND CARE) IMPLANT
ELECT NDL TIP 2.8 STRL (NEEDLE) IMPLANT
ELECT NEEDLE TIP 2.8 STRL (NEEDLE) ×2 IMPLANT
GLOVE BIO SURGEON STRL SZ 6.5 (GLOVE) IMPLANT
GLOVE BIOGEL PI IND STRL 6.5 (GLOVE) IMPLANT
GLOVE BIOGEL PI IND STRL 7.0 (GLOVE) IMPLANT
GLOVE BIOGEL PI INDICATOR 6.5 (GLOVE)
GLOVE BIOGEL PI INDICATOR 7.0 (GLOVE)
NDL HYPO 25X1 1.5 SAFETY (NEEDLE) IMPLANT
NEEDLE HYPO 25X1 1.5 SAFETY (NEEDLE) IMPLANT
NS IRRIG 1000ML POUR BTL (IV SOLUTION) IMPLANT
SUT ETHILON 3 0 FSL (SUTURE) IMPLANT
SUT MNCRL AB 4-0 PS2 18 (SUTURE) IMPLANT
SUT PROLENE 4 0 PS 2 18 (SUTURE) IMPLANT
SUT VIC AB 3-0 SH 27 (SUTURE)
SUT VIC AB 3-0 SH 27X BRD (SUTURE) IMPLANT
SYR CONTROL 10ML LL (SYRINGE) IMPLANT

## 2019-07-02 NOTE — Discharge Instructions (Signed)
Discharge Instructions:  Keep bandage in place unless it becomes saturated with blood until 07/04/2019. If it is saturated, then replace the bandage and hold firm pressure. You can shower after 07/04/19 with the sutures in place, no baths or submerging. Prior to 07/04/19 try to keep the bandage dry, and either bird bath or cover the area with plastic and tape while showering.  Stitches (sutures) will be removed in about 10-14 days.  Wear a bandage of the area daily to keep from rubbing the stitches.  Activity as tolerated. Avoid excessive stretching or pulling on the incision site.   Medication: Take tylenol and ibuprofen as needed for pain control, alternating every 4-6 hours.  Example:  Tylenol 1000mg  @ 6am, 12noon, 6pm, 58midnight (Do not exceed 4000mg  of tylenol a day). Ibuprofen 800mg  @ 9am, 3pm, 9pm, 3am (Do not exceed 3600mg  of ibuprofen a day).  Take Roxicodone for breakthrough pain every 4 hours.  Take Colace for constipation related to narcotic pain medication. If you do not have a bowel movement in 2 days, take Miralax over the counter.  Drink plenty of water to also prevent constipation.   Contact Information: If you have questions or concerns, please call our office, [phone], Monday- Thursday 8AM-5PM and Friday 8AM-12Noon.  If it is after hours or on the weekend, please call [facility] Main Number, [phone], and ask to speak to the surgeon on call for Dr. Constance Haw at [facility].

## 2019-07-02 NOTE — Op Note (Signed)
[facility] Operative Note  07/02/19  Preoperative Diagnosis:  Sebaceous cyst left thigh    Postoperative Diagnosis: Same   Procedure(s) Performed: Excision of cyst from left thigh 1.5cm    Surgeon: Lanell Matar. Constance Haw, MD   Assistants: No qualified resident was available    Anesthesia: Local anesthetic; 0.25% bupivacaine     Anesthesiologist: None    Specimens: Sebaceous cyst    Estimated Blood Loss: Minimal   Blood Replacement: None    Complications: None   Wound Class: Clean   Operative Indications:  Mr. Santaana is a 63 yo with a left thigh cyst that becomes infected periodically over the last few months. His wife expresses contents from the cyst when it becomes swollen and red.   Findings: Left inner thigh cyst    Procedure: The patient was taken to the procedure room and his left leg was frog legged with all pressure points padded. The area was prepped and draped in the standard fashion.  The area over the cyst was anesthestized with local, and an elliptical skin incision was made over the area of the cyst to include the central pits.  This was deepened down through the subcutaneous tissue with electrocautery, and the cyst in its entirety was removed.  The cavity was made hemostatic. The cyst was approximately 1.5cm in size with surrounding healthy adipose tissue.  The deep subcutaneous tissue was closed with 3-0 Vicryl interrupted, and the skin was closed with 3-0 Nylon interrupted sutures. A gauze and paper tape were placed over the incision.   Final inspection revealed acceptable hemostasis. All counts were correct at the end of the case. The patient did well through the procedure, and was discharged from PACU in stable condition.    Curlene Labrum, MD [facility] [address] [place], [place] [id number] [phone] (office)

## 2019-07-05 ENCOUNTER — Encounter (HOSPITAL_COMMUNITY): Payer: Self-pay | Admitting: General Surgery

## 2019-07-05 LAB — SURGICAL PATHOLOGY

## 2019-07-09 ENCOUNTER — Other Ambulatory Visit: Payer: Self-pay | Admitting: Family Medicine

## 2019-07-09 DIAGNOSIS — E1169 Type 2 diabetes mellitus with other specified complication: Secondary | ICD-10-CM

## 2019-07-09 DIAGNOSIS — I25700 Atherosclerosis of coronary artery bypass graft(s), unspecified, with unstable angina pectoris: Secondary | ICD-10-CM

## 2019-07-09 DIAGNOSIS — E785 Hyperlipidemia, unspecified: Secondary | ICD-10-CM

## 2019-07-09 DIAGNOSIS — Z720 Tobacco use: Secondary | ICD-10-CM

## 2019-07-15 ENCOUNTER — Ambulatory Visit: Payer: Self-pay | Admitting: General Surgery

## 2019-08-13 ENCOUNTER — Other Ambulatory Visit: Payer: Self-pay

## 2019-08-13 ENCOUNTER — Ambulatory Visit (INDEPENDENT_AMBULATORY_CARE_PROVIDER_SITE_OTHER): Payer: BLUE CROSS/BLUE SHIELD | Admitting: Family Medicine

## 2019-08-13 ENCOUNTER — Encounter: Payer: Self-pay | Admitting: Family Medicine

## 2019-08-13 VITALS — BP 122/71 | HR 77 | Temp 98.9°F | Ht 66.0 in | Wt 261.0 lb

## 2019-08-13 DIAGNOSIS — N401 Enlarged prostate with lower urinary tract symptoms: Secondary | ICD-10-CM | POA: Diagnosis not present

## 2019-08-13 DIAGNOSIS — E1169 Type 2 diabetes mellitus with other specified complication: Secondary | ICD-10-CM

## 2019-08-13 DIAGNOSIS — R351 Nocturia: Secondary | ICD-10-CM

## 2019-08-13 DIAGNOSIS — I25700 Atherosclerosis of coronary artery bypass graft(s), unspecified, with unstable angina pectoris: Secondary | ICD-10-CM | POA: Diagnosis not present

## 2019-08-13 DIAGNOSIS — E785 Hyperlipidemia, unspecified: Secondary | ICD-10-CM

## 2019-08-13 DIAGNOSIS — E1165 Type 2 diabetes mellitus with hyperglycemia: Secondary | ICD-10-CM

## 2019-08-13 LAB — BAYER DCA HB A1C WAIVED: HB A1C (BAYER DCA - WAIVED): 7.5 % — ABNORMAL HIGH (ref <7.0)

## 2019-08-13 NOTE — Progress Notes (Signed)
Subjective: CC: DM2, HLD PCP: Raliegh Ip, DO Randall Hall is a 63 y.o. male presenting to clinic today for:  1. Type 2 Diabetes w/ HLD:  Patient reports compliance with metformin, atorvastatin.  He does admit to eating candy and drinking sodas daily since Halloween.  No chest pain, shortness of breath, lower extremity were, falls or dizziness.   Last eye exam: Up-to-date.  Had with Dr. Conley Rolls shortly after her last visit Last foot exam: Up-to-date Last A1c:  Lab Results  Component Value Date   HGBA1C 7.4 (H) 05/11/2019   Nephropathy screen indicated?:  UTD Last flu, zoster and/or pneumovax:  There is no immunization history on file for this patient.  2.  BPH with LUTS Patient reports he has had increased urinary frequency and nocturia because his pharmacy has not been able to get his Cialis 2.5 mg in over 3 weeks.  He has not tried calling other pharmacies to see if this is available.  3.  Tobacco use/ hx CABG Patient reports smoking on average 1 pack/day.  No hemoptysis, unplanned weight loss, night sweats, shortness of breath.  He occasionally does have a cough and wheezing.  He was successful in smoking cessation for about a year and a half but started back because his wife is a smoker and could not quit.  Medical history is significant for prior MI with bypass graft.  He is overdue for follow-up with his cardiologist   ROS: Per HPI  Allergies  Allergen Reactions  . Sulfa Antibiotics Anaphylaxis    hives swelling   Past Medical History:  Diagnosis Date  . AKI (acute kidney injury) (HCC) 07/14/2016  . CAD (coronary artery disease)   . History of myocardial infarction   . Hx of CABG   . Hyperlipemia   . MI (myocardial infarction) (HCC)   . Tobacco abuse     Current Outpatient Medications:  .  aspirin EC 81 MG tablet, Take 1 tablet (81 mg total) by mouth daily., Disp: 90 tablet, Rfl: 3 .  atorvastatin (LIPITOR) 40 MG tablet, TAKE 1 TABLET (40 MG TOTAL) BY  MOUTH DAILY AT 6 PM., Disp: 90 tablet, Rfl: 0 .  BLACK CURRANT SEED OIL PO, Take 1 Dose by mouth daily., Disp: , Rfl:  .  buPROPion (WELLBUTRIN XL) 150 MG 24 hr tablet, TAKE 1 TABLET BY MOUTH EVERY DAY, Disp: 90 tablet, Rfl: 0 .  docusate sodium (COLACE) 100 MG capsule, Take 1 capsule (100 mg total) by mouth 2 (two) times daily., Disp: 60 capsule, Rfl: 2 .  GARLIC PO, Take 1 tablet by mouth daily., Disp: , Rfl:  .  metFORMIN (GLUCOPHAGE) 500 MG tablet, Take 1 tablet (500 mg total) by mouth 2 (two) times daily with a meal., Disp: 180 tablet, Rfl: 1 .  nitroGLYCERIN (NITROSTAT) 0.4 MG SL tablet, PLACE 1 TABLET (0.4 MG TOTAL) UNDER THE TONGUE EVERY 5 (FIVE) MINUTES AS NEEDED FOR CHEST PAIN., Disp: 25 tablet, Rfl: 0 .  Omega-3 Fatty Acids (FISH OIL PO), Take 800 mg by mouth daily., Disp: , Rfl:  .  oxyCODONE (ROXICODONE) 5 MG immediate release tablet, Take 1 tablet (5 mg total) by mouth every 4 (four) hours as needed for severe pain or breakthrough pain., Disp: 10 tablet, Rfl: 0 .  Tadalafil 2.5 MG TABS, Take 1 tablet (2.5 mg total) by mouth daily., Disp: 30 each, Rfl: 12 .  TURMERIC PO, Take 1,000 mg by mouth daily., Disp: , Rfl:  .  VITAMIN E PO,  Take 2 capsules by mouth daily., Disp: , Rfl:  Social History   Socioeconomic History  . Marital status: Married    Spouse name: Not on file  . Number of children: 4  . Years of education: Not on file  . Highest education level: Not on file  Occupational History  . Occupation: TRUCK DRIVER    Employer: [employer]  Tobacco Use  . Smoking status: Current Every Day Smoker    Packs/day: 1.00    Years: 21.00    Pack years: 21.00    Types: Cigarettes  . Smokeless tobacco: Never Used  Substance and Sexual Activity  . Alcohol use: Yes  . Drug use: No    Types: Marijuana  . Sexual activity: Not on file  Other Topics Concern  . Not on file  Social History Narrative   ** Merged History Encounter **       ** Data from: 12/28/16 Enc Dept:  [facility]   Married.  He has seven grandchildren.        ** Data from: 03/23/17 Enc Dept: [facility]   Lives with wife   Social Determinants of Health   Financial Resource Strain:   . Difficulty of Paying Living Expenses: Not on file  Food Insecurity:   . Worried About Programme researcher, broadcasting/film/videounning Out of Food in the Last Year: Not on file  . Ran Out of Food in the Last Year: Not on file  Transportation Needs:   . Lack of Transportation (Medical): Not on file  . Lack of Transportation (Non-Medical): Not on file  Physical Activity:   . Days of Exercise per Week: Not on file  . Minutes of Exercise per Session: Not on file  Stress:   . Feeling of Stress : Not on file  Social Connections:   . Frequency of Communication with Friends and Family: Not on file  . Frequency of Social Gatherings with Friends and Family: Not on file  . Attends Religious Services: Not on file  . Active Member of Clubs or Organizations: Not on file  . Attends BankerClub or Organization Meetings: Not on file  . Marital Status: Not on file  Intimate Partner Violence:   . Fear of Current or Ex-Partner: Not on file  . Emotionally Abused: Not on file  . Physically Abused: Not on file  . Sexually Abused: Not on file   Family History  Problem Relation Age of Onset  . Diabetes Mother   . CAD Father 951       Died  . CAD Brother     Objective: Office vital signs reviewed. There were no vitals taken for this visit.  Physical Examination:  General: Awake, alert, obese, No acute distress HEENT: Normal, sclera white, MMM Cardio: regular rate and rhythm, S1S2 heard, no murmurs appreciated; well healed post surgical scar in midline of chest Pulm: Mild wheezing on the right upper and lower lung field that cleared after coughing.  Lungs are otherwise clear to auscultation bilaterally with normal work of breathing on room air Extremities: warm, well perfused, No edema, cyanosis or clubbing; +2 pulses bilaterally   Assessment/ Plan: 63 y.o. male   1. Uncontrolled type 2 diabetes mellitus with hyperglycemia (HCC) A1c persistently elevated at 7.5 today.  Patient refused to increase Metformin despite elevation of blood sugar but does contract to totally eliminate candy and soda and reduce carbohydrate intake prior to her next visit.  He will work on lifestyle modification and we discussed alternative foods and  drinks.  If no reduction in blood sugar below goal of 7, plan to increase Metformin to 1000 mg twice daily. - hgba1c  2. Hyperlipidemia associated with type 2 diabetes mellitus ([place]) Continue statin for secondary prevention  3. 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 Benign prostatic hyperplasia with nocturia I have given him a handwritten prescription for Cialis and encouraged him to contact the local pharmacies to see if perhaps somebody might have some of this medicine available.  I suspect that it is a Designer, fashion/clothing delay.   Orders Placed This Encounter  Procedures  . hgba1c   No orders of the defined types were placed in this encounter.    Janora Norlander, DO [place] [phone]

## 2019-08-13 NOTE — Patient Instructions (Signed)
Sugar has got to go.  Otherwise, will need to increase your medication.  I want you to love yourself enough to give yourself the good care and nutrition you deserve.  Cut the candy and sodas out.  Dilute sweet tea with 1/2 sweet and 1/2 unsweet and drink as a treat.  You should be drinking water!  GREAT job getting your eye exam done.    Next step I want to get you and Mrs Feria off cigarettes.

## 2020-01-09 ENCOUNTER — Other Ambulatory Visit: Payer: Self-pay | Admitting: Family Medicine

## 2020-01-09 DIAGNOSIS — E1169 Type 2 diabetes mellitus with other specified complication: Secondary | ICD-10-CM

## 2020-01-09 DIAGNOSIS — I25700 Atherosclerosis of coronary artery bypass graft(s), unspecified, with unstable angina pectoris: Secondary | ICD-10-CM

## 2020-02-02 ENCOUNTER — Other Ambulatory Visit: Payer: Self-pay | Admitting: Family Medicine

## 2020-02-02 DIAGNOSIS — N401 Enlarged prostate with lower urinary tract symptoms: Secondary | ICD-10-CM

## 2020-02-02 NOTE — Telephone Encounter (Signed)
Patient needs to be seen.

## 2020-02-07 MED ORDER — TADALAFIL 2.5 MG PO TABS: 2.5000 mg | ORAL_TABLET | Freq: Every day | ORAL | 0 refills | Status: DC

## 2020-02-07 NOTE — Telephone Encounter (Signed)
Pt scheduled with Dr Reece Agar on 7/23 at 3:45. 30 day supply sent to pharmacy.

## 2020-02-07 NOTE — Addendum Note (Signed)
Addended by: Hessie Diener on: 02/07/2020 11:40 AM   Modules accepted: Orders

## 2020-02-16 ENCOUNTER — Other Ambulatory Visit: Payer: Self-pay | Admitting: Family Medicine

## 2020-02-16 DIAGNOSIS — E118 Type 2 diabetes mellitus with unspecified complications: Secondary | ICD-10-CM

## 2020-03-01 ENCOUNTER — Other Ambulatory Visit: Payer: Self-pay | Admitting: Family Medicine

## 2020-03-01 DIAGNOSIS — N401 Enlarged prostate with lower urinary tract symptoms: Secondary | ICD-10-CM

## 2020-03-17 ENCOUNTER — Encounter: Payer: Self-pay | Admitting: Family Medicine

## 2020-03-17 ENCOUNTER — Other Ambulatory Visit: Payer: Self-pay

## 2020-03-17 ENCOUNTER — Ambulatory Visit (INDEPENDENT_AMBULATORY_CARE_PROVIDER_SITE_OTHER): Payer: 59 | Admitting: Family Medicine

## 2020-03-17 VITALS — BP 109/63 | HR 62 | Temp 97.5°F | Ht 66.0 in | Wt 243.4 lb

## 2020-03-17 DIAGNOSIS — I25700 Atherosclerosis of coronary artery bypass graft(s), unspecified, with unstable angina pectoris: Secondary | ICD-10-CM | POA: Diagnosis not present

## 2020-03-17 DIAGNOSIS — N401 Enlarged prostate with lower urinary tract symptoms: Secondary | ICD-10-CM | POA: Diagnosis not present

## 2020-03-17 DIAGNOSIS — R351 Nocturia: Secondary | ICD-10-CM

## 2020-03-17 DIAGNOSIS — E1169 Type 2 diabetes mellitus with other specified complication: Secondary | ICD-10-CM | POA: Diagnosis not present

## 2020-03-17 DIAGNOSIS — J3489 Other specified disorders of nose and nasal sinuses: Secondary | ICD-10-CM | POA: Diagnosis not present

## 2020-03-17 DIAGNOSIS — E785 Hyperlipidemia, unspecified: Secondary | ICD-10-CM

## 2020-03-17 LAB — BAYER DCA HB A1C WAIVED: HB A1C (BAYER DCA - WAIVED): 6.8 % (ref <7.0)

## 2020-03-17 MED ORDER — AZELASTINE HCL 0.1 % NA SOLN: 1.0000 | Freq: Two times a day (BID) | NASAL | 12 refills | Status: DC

## 2020-03-17 MED ORDER — TADALAFIL 2.5 MG PO TABS: 1.0000 | ORAL_TABLET | Freq: Every day | ORAL | 3 refills | Status: DC

## 2020-03-17 MED ORDER — ATORVASTATIN CALCIUM 40 MG PO TABS: 40.0000 mg | ORAL_TABLET | Freq: Every day | ORAL | 3 refills | Status: DC

## 2020-03-17 MED ORDER — METFORMIN HCL 500 MG PO TABS: 500.0000 mg | ORAL_TABLET | Freq: Two times a day (BID) | ORAL | 3 refills | Status: DC

## 2020-03-17 MED ORDER — NITROGLYCERIN 0.4 MG SL SUBL: 0.4000 mg | SUBLINGUAL_TABLET | SUBLINGUAL | 0 refills | Status: DC | PRN

## 2020-03-17 NOTE — Progress Notes (Signed)
Subjective: CC: f/u DM2, HLD PCP: Janora Norlander, DO PPJ:KDTO Emberson is a 64 y.o. male presenting to clinic today for:  1. Type 2 Diabetes w/ HLD; h/o CABG:  Patient reports compliance with metformin, atorvastatin.  He refused to adjust Metformin last visit but made a promise to get rid of sweets.  He is totally eliminated soft drinks from his diet.  He continues to eat some candy but feels the blood sugars are to be well controlled today.  No chest pain, shortness of breath, lower extremity numbness or edema, falls or dizziness. No need for Nitro but thinks current bottle expired.  Last eye exam: Needs Last foot exam: Up-to-date Last A1c:  Lab Results  Component Value Date   HGBA1C 7.5 (H) 08/13/2019   Nephropathy screen indicated?:  needs Last flu, zoster and/or pneumovax:  There is no immunization history on file for this patient.  2.  BPH with LUTS Patient reports compliance with Cialis 2.5 mg daily.  Urine frequency and nocturia have improved with this medicine.  He needs refills  3.  Rhinorrhea Patient reports postnasal drip that occurs every day.  He is wondering if there is anything that can help with this.  He has used several OTC antihistamines but this has not helped.   ROS: Per HPI  Allergies  Allergen Reactions   Sulfa Antibiotics Anaphylaxis    hives swelling   Past Medical History:  Diagnosis Date   AKI (acute kidney injury) ([place]) 07/14/2016   CAD (coronary artery disease)    History of myocardial infarction    Hx of CABG    Hyperlipemia    MI (myocardial infarction) (HCC)    Tobacco abuse     Current Outpatient Medications:    aspirin EC 81 MG tablet, Take 1 tablet (81 mg total) by mouth daily., Disp: 90 tablet, Rfl: 3   atorvastatin (LIPITOR) 40 MG tablet, TAKE 1 TABLET (40 MG TOTAL) BY MOUTH DAILY AT 6 PM., Disp: 90 tablet, Rfl: 0   BLACK CURRANT SEED OIL PO, Take 1 Dose by mouth daily., Disp: , Rfl:    buPROPion (WELLBUTRIN XL)  150 MG 24 hr tablet, TAKE 1 TABLET BY MOUTH EVERY DAY, Disp: 90 tablet, Rfl: 0   GARLIC PO, Take 1 tablet by mouth daily., Disp: , Rfl:    metFORMIN (GLUCOPHAGE) 500 MG tablet, TAKE 1 TABLET (500 MG TOTAL) BY MOUTH 2 (TWO) TIMES DAILY WITH A MEAL., Disp: 180 tablet, Rfl: 0   nitroGLYCERIN (NITROSTAT) 0.4 MG SL tablet, PLACE 1 TABLET (0.4 MG TOTAL) UNDER THE TONGUE EVERY 5 (FIVE) MINUTES AS NEEDED FOR CHEST PAIN., Disp: 25 tablet, Rfl: 0   Omega-3 Fatty Acids (FISH OIL PO), Take 800 mg by mouth daily., Disp: , Rfl:    Tadalafil 2.5 MG TABS, TAKE 1 TABLET BY MOUTH EVERY DAY, Disp: 30 tablet, Rfl: 0   TURMERIC PO, Take 1,000 mg by mouth daily., Disp: , Rfl:    VITAMIN E PO, Take 2 capsules by mouth daily., Disp: , Rfl:  Social History   Socioeconomic History   Marital status: Married    Spouse name: Not on file   Number of children: 4   Years of education: Not on file   Highest education level: Not on file  Occupational History   Occupation: TRUCK DRIVER    Employer: [employer]  Tobacco Use   Smoking status: Current Every Day Smoker    Packs/day: 1.00    Years: 21.00  Pack years: 21.00    Types: Cigarettes   Smokeless tobacco: Never Used  Vaping Use   Vaping Use: Never used  Substance and Sexual Activity   Alcohol use: Not Currently   Drug use: Not Currently   Sexual activity: Not on file  Other Topics Concern   Not on file  Social History Narrative   ** Merged History Encounter **       ** Data from: 12/28/16 Enc Dept: [place] DEPT   Married.  He has seven grandchildren.        ** Data from: 03/23/17 Enc Dept: [place]   Lives with wife   Social Determinants of Health   Financial Resource Strain:    Difficulty of Paying Living Expenses:   Food Insecurity:    Worried About Charity fundraiser in the Last Year:    Arboriculturist in the Last Year:   Transportation Needs:    Film/video editor (Medical):    Lack of  Transportation (Non-Medical):   Physical Activity:    Days of Exercise per Week:    Minutes of Exercise per Session:   Stress:    Feeling of Stress :   Social Connections:    Frequency of Communication with Friends and Family:    Frequency of Social Gatherings with Friends and Family:    Attends Religious Services:    Active Member of Clubs or Organizations:    Attends Music therapist:    Marital Status:   Intimate Partner Violence:    Fear of Current or Ex-Partner:    Emotionally Abused:    Physically Abused:    Sexually Abused:    Family History  Problem Relation Age of Onset   Diabetes Mother    CAD Father 17       Died   CAD Brother     Objective: Office vital signs reviewed. BP (!) 109/63    Pulse 62    Temp (!) 97.5 F (36.4 C)    Ht '5\' 6"'  (1.676 m)    Wt (!) 243 lb 6.4 oz (110.4 kg)    SpO2 96%    BMI 39.29 kg/m   Physical Examination:  General: Awake, alert, obese, No acute distress HEENT: Normal, sclera white, MMM; no nasal discharge appreciated Cardio: regular rate and rhythm, S1S2 heard, no murmurs appreciated; well healed post surgical scar in midline of chest Pulm: clear to auscultation bilaterally with normal work of breathing on room air Extremities: warm, well perfused, No edema, cyanosis or clubbing; +2 pulses bilaterally Neuro: See diabetic foot exam as below  Diabetic Foot Exam - Simple   Simple Foot Form Diabetic Foot exam was performed with the following findings: Yes 03/17/2020  5:22 PM  Visual Inspection No deformities, no ulcerations, no other skin breakdown bilaterally: Yes Sensation Testing Intact to touch and monofilament testing bilaterally: Yes Pulse Check Posterior Tibialis and Dorsalis pulse intact bilaterally: Yes Comments     Assessment/ Plan: 64 y.o. male   1. Controlled type 2 diabetes mellitus with other specified complication, without long-term current use of insulin ([place]) Diabetes now under  control with A1c down to 6.8.  Refills of Metformin sent.  He can follow-up in 6 months, sooner if needed.  Encouraged to get diabetic eye exam - Bayer DCA Hb A1c Waived - CMP14+EGFR - Microalbumin / creatinine urine ratio - metFORMIN (GLUCOPHAGE) 500 MG tablet; Take 1 tablet (500 mg total) by mouth 2 (two) times daily with a  meal.  Dispense: 180 tablet; Refill: 3  2. Hyperlipidemia associated with type 2 diabetes mellitus (HCC) Continue statin.  Check direct LDL - CMP14+EGFR - atorvastatin (LIPITOR) 40 MG tablet; Take 1 tablet (40 mg total) by mouth daily at 6 PM.  Dispense: 90 tablet; Refill: 3 - LDL Cholesterol, Direct  3. Coronary artery disease involving coronary bypass graft of native heart with unstable angina pectoris (HCC) - atorvastatin (LIPITOR) 40 MG tablet; Take 1 tablet (40 mg total) by mouth daily at 6 PM.  Dispense: 90 tablet; Refill: 3 - nitroGLYCERIN (NITROSTAT) 0.4 MG SL tablet; Place 1 tablet (0.4 mg total) under the tongue every 5 (five) minutes as needed for chest pain.  Dispense: 25 tablet; Refill: 0 - LDL Cholesterol, Direct  4. Benign prostatic hyperplasia with nocturia - PSA - Tadalafil 2.5 MG TABS; Take 1 tablet (2.5 mg total) by mouth daily.  Dispense: 90 tablet; Refill: 3  5. Rhinorrhea Trial of Astelin. - azelastine (ASTELIN) 0.1 % nasal spray; Place 1 spray into both nostrils 2 (two) times daily.  Dispense: 30 mL; Refill: 12   Encouraged to get COVID-19 vaccination given multiple comorbidities.  We had a long discussion about the risks and benefits of the medications.  No orders of the defined types were placed in this encounter.  Meds ordered this encounter  Medications   atorvastatin (LIPITOR) 40 MG tablet    Sig: Take 1 tablet (40 mg total) by mouth daily at 6 PM.    Dispense:  90 tablet    Refill:  3   metFORMIN (GLUCOPHAGE) 500 MG tablet    Sig: Take 1 tablet (500 mg total) by mouth 2 (two) times daily with a meal.    Dispense:  180 tablet      Refill:  3   nitroGLYCERIN (NITROSTAT) 0.4 MG SL tablet    Sig: Place 1 tablet (0.4 mg total) under the tongue every 5 (five) minutes as needed for chest pain.    Dispense:  25 tablet    Refill:  0   Tadalafil 2.5 MG TABS    Sig: Take 1 tablet (2.5 mg total) by mouth daily.    Dispense:  90 tablet    Refill:  3   azelastine (ASTELIN) 0.1 % nasal spray    Sig: Place 1 spray into both nostrils 2 (two) times daily.    Dispense:  30 mL    Refill:  [place], [place] [phone]

## 2020-03-17 NOTE — Patient Instructions (Signed)

## 2020-03-18 LAB — CMP14+EGFR
ALT: 16 IU/L (ref 0–44)
AST: 16 IU/L (ref 0–40)
Albumin/Globulin Ratio: 2 (ref 1.2–2.2)
Albumin: 4.5 g/dL (ref 3.8–4.8)
Alkaline Phosphatase: 92 IU/L (ref 48–121)
BUN/Creatinine Ratio: 12 (ref 10–24)
BUN: 14 mg/dL (ref 8–27)
Bilirubin Total: 0.3 mg/dL (ref 0.0–1.2)
CO2: 24 mmol/L (ref 20–29)
Calcium: 9.5 mg/dL (ref 8.6–10.2)
Chloride: 101 mmol/L (ref 96–106)
Creatinine, Ser: 1.18 mg/dL (ref 0.76–1.27)
GFR calc Af Amer: 75 mL/min/{1.73_m2} (ref >59)
GFR calc non Af Amer: 65 mL/min/{1.73_m2} (ref >59)
Globulin, Total: 2.3 g/dL (ref 1.5–4.5)
Glucose: 93 mg/dL (ref 65–99)
Potassium: 4.4 mmol/L (ref 3.5–5.2)
Sodium: 139 mmol/L (ref 134–144)
Total Protein: 6.8 g/dL (ref 6.0–8.5)

## 2020-03-18 LAB — PSA: Prostate Specific Ag, Serum: 1.1 ng/mL (ref 0.0–4.0)

## 2020-03-18 LAB — LDL CHOLESTEROL, DIRECT: LDL Direct: 85 mg/dL (ref 0–99)

## 2020-07-12 ENCOUNTER — Other Ambulatory Visit: Payer: Self-pay

## 2020-07-12 ENCOUNTER — Encounter: Payer: Self-pay | Admitting: Cardiology

## 2020-07-12 ENCOUNTER — Ambulatory Visit (INDEPENDENT_AMBULATORY_CARE_PROVIDER_SITE_OTHER): Payer: 59 | Admitting: Cardiology

## 2020-07-12 VITALS — BP 156/94 | HR 74 | Ht 65.0 in | Wt 242.0 lb

## 2020-07-12 DIAGNOSIS — E118 Type 2 diabetes mellitus with unspecified complications: Secondary | ICD-10-CM

## 2020-07-12 DIAGNOSIS — I2510 Atherosclerotic heart disease of native coronary artery without angina pectoris: Secondary | ICD-10-CM | POA: Diagnosis not present

## 2020-07-12 DIAGNOSIS — R5383 Other fatigue: Secondary | ICD-10-CM

## 2020-07-12 DIAGNOSIS — E785 Hyperlipidemia, unspecified: Secondary | ICD-10-CM | POA: Diagnosis not present

## 2020-07-12 DIAGNOSIS — Z720 Tobacco use: Secondary | ICD-10-CM

## 2020-07-12 NOTE — Patient Instructions (Signed)
Medication Instructions:  The current medical regimen is effective;  continue present plan and medications.  *If you need a refill on your cardiac medications before your next appointment, please call your pharmacy*  Follow-Up: At [facility], you and your health needs are our priority.  As part of our continuing mission to provide you with exceptional heart care, we have created designated Provider Care Teams.  These Care Teams include your primary Cardiologist (physician) and Advanced Practice Providers (APPs -  Physician Assistants and Nurse Practitioners) who all work together to provide you with the care you need, when you need it.  We recommend signing up for the patient portal called "MyChart".  Sign up information is provided on this After Visit Summary.  MyChart is used to connect with patients for Virtual Visits (Telemedicine).  Patients are able to view lab/test results, encounter notes, upcoming appointments, etc.  Non-urgent messages can be sent to your provider as well.   To learn more about what you can do with MyChart, go to [email or web address].    Your next appointment:   12 month(s)  The format for your next appointment:   In Person  Provider:   Rollene Rotunda, MD   Thank you for choosing [facility]!!     [employer]ron Blood Pressure Monitor

## 2020-07-12 NOTE — Progress Notes (Signed)
Cardiology Office Note   Date:  07/12/2020   ID:  Randall Hall, DOB [birth year], MRN [id number]  PCP:  Randall Ip, DO  Cardiologist:   No primary care provider on file.   Chief Complaint  Patient presents with   Coronary Artery Disease      History of Present Illness: Randall Hall is a 64 y.o. male who presents for follow-up of coronary disease.  He is status post DES to a vein graft as below.     It has been a couple of years since I saw him.  He is done well.  He denies any cardiovascular symptoms.  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  He denies any cardiovascular symptoms. The patient denies any new symptoms such as chest discomfort, neck or arm discomfort. There has been no new shortness of breath, PND or orthopnea. There have been no reported palpitations, presyncope or syncope.     Past Medical History:  Diagnosis Date   AKI (acute kidney injury) (HCC) 07/14/2016   CAD (coronary artery disease)    History of myocardial infarction    Hx of CABG    Hyperlipemia    MI (myocardial infarction) (HCC)    Tobacco abuse     Past Surgical History:  Procedure Laterality Date   CARDIAC CATHETERIZATION N/A 07/16/2016   Procedure: Left Heart Cath and Cors/Grafts Angiography;  Surgeon: Randall Hazel, MD;  Location: [facility] INVASIVE CV LAB;  Service: Cardiovascular;  Laterality: N/A;   CARDIAC CATHETERIZATION N/A 07/16/2016   Procedure: Coronary Stent Intervention;  Surgeon: Randall Hazel, MD;  Location: [facility] INVASIVE CV LAB;  Service: Cardiovascular;  Laterality: N/A;   CORONARY ANGIOPLASTY WITH STENT PLACEMENT     CORONARY ARTERY BYPASS GRAFT     2004 Dr. Laneta Simmers LIMA to the LAD, SVG to PDA and posterior lateral, SVG to diagonal, RIMA to obtuse marginal.  Last catheterization 2008.   LIPOMA EXCISION Left 07/02/2019   Procedure: MINOR EXCISION OF CYST LEFT THIGH;  Surgeon: Randall Roers, MD;   Location: [facility] ORS;  Service: General;  Laterality: Left;  Pt to arrive at 8:00am     Current Outpatient Medications  Medication Sig Dispense Refill   aspirin EC 81 MG tablet Take 1 tablet (81 mg total) by mouth daily. 90 tablet 3   atorvastatin (LIPITOR) 40 MG tablet Take 1 tablet (40 mg total) by mouth daily at 6 PM. 90 tablet 3   azelastine (ASTELIN) 0.1 % nasal spray Place 1 spray into both nostrils 2 (two) times daily. (Patient taking differently: Place 1 spray into both nostrils 2 (two) times daily as needed. ) 30 mL 12   BLACK CURRANT SEED OIL PO Take 1 Dose by mouth daily.     buPROPion (WELLBUTRIN XL) 150 MG 24 hr tablet TAKE 1 TABLET BY MOUTH EVERY DAY 90 tablet 0   GARLIC PO Take 1 tablet by mouth daily.     metFORMIN (GLUCOPHAGE) 500 MG tablet Take 1 tablet (500 mg total) by mouth 2 (two) times daily with a meal. 180 tablet 3   nitroGLYCERIN (NITROSTAT) 0.4 MG SL tablet Place 1 tablet (0.4 mg total) under the tongue every 5 (five) minutes as needed for chest pain. 25 tablet 0   Omega-3 Fatty Acids (FISH OIL PO) Take 800 mg by mouth daily.     Tadalafil 2.5 MG TABS Take 1 tablet (2.5 mg total) by mouth daily. 90  tablet 3   TURMERIC PO Take 1,000 mg by mouth daily.     VITAMIN E PO Take 2 capsules by mouth daily.     No current facility-administered medications for this visit.    Allergies:   Sulfa antibiotics    ROS:  Please see the history of present illness.   Otherwise, review of systems are positive for none.   All other systems are reviewed and negative.    PHYSICAL EXAM: VS:  BP (!) 156/94    Pulse 74    Ht 5\' 5"  (1.651 m)    Wt 242 lb (109.8 kg)    BMI 40.27 kg/m  , BMI Body mass index is 40.27 kg/m. GENERAL:  Well appearing HEENT:  Pupils equal round and reactive, fundi not visualized, oral mucosa unremarkable NECK:  No jugular venous distention, waveform within normal limits, carotid upstroke brisk and symmetric, no bruits, no thyromegaly LYMPHATICS:   No cervical, inguinal adenopathy LUNGS:  Clear to auscultation bilaterally BACK:  No CVA tenderness CHEST: Well healed sternotomy scar. HEART:  PMI not displaced or sustained,S1 and S2 within normal limits, no S3, no S4, no clicks, no rubs, no murmurs ABD:  Flat, positive bowel sounds normal in frequency in pitch, no bruits, no rebound, no guarding, no midline pulsatile mass, no hepatomegaly, no splenomegaly EXT:  2 plus pulses throughout, no edema, no cyanosis no clubbing SKIN:  No rashes no nodules NEURO:  Cranial nerves II through XII grossly intact, motor grossly intact throughout PSYCH:  Cognitively intact, oriented to person place and time    EKG:  EKG is ordered today. The ekg ordered today demonstrates sinus rhythm, rate 74, axis within normal limits, intervals within normal limits, no acute ST-T wave changes.   Recent Labs: 03/17/2020: ALT 16; BUN 14; Creatinine, Ser 1.18; Potassium 4.4; Sodium 139    Lipid Panel    Component Value Date/Time   CHOL 266 (H) 10/16/2018 1610   TRIG 320 (H) 10/16/2018 1610   HDL 33 (L) 10/16/2018 1610   CHOLHDL 8.1 (H) 10/16/2018 1610   CHOLHDL 7.9 07/15/2016 0136   VLDL 69 (H) 07/15/2016 0136   LDLCALC 169 (H) 10/16/2018 1610   LDLDIRECT 85 03/17/2020 1634      Wt Readings from Last 3 Encounters:  07/12/20 242 lb (109.8 kg)  03/17/20 (!) 243 lb 6.4 oz (110.4 kg)  08/13/19 261 lb (118.4 kg)      Other studies Reviewed: Additional studies/ records that were reviewed today include: Labs. Review of the above records demonstrates:  Please see elsewhere in the note.     ASSESSMENT AND PLAN:  CAD:   The patient has no new sypmtoms.  No further cardiovascular testing is indicated.  We will continue with aggressive risk reduction and meds as listed.  DYSLIPIDEMIA:    LDL was slightly elevated 85.  However, he is dieting more and losing weight will make an adjustment to his meds.  DM:  AC1 was down to 6.8 from 7.2.    TOBACCO:     He is smoking pipes but not cigarettes.  I encouraged complete abstinence.   OBESITY:     He lost 20 pounds and I am proud of that.  He is cutting out sugar and I encouraged more the same.    Current medicines are reviewed at length with the patient today.  The patient does not have concerns regarding medicines.  The following changes have been made:  no change  Labs/ tests ordered today  include: None  Orders Placed This Encounter  Procedures   EKG 12-Lead     Disposition:   FU with me in one year.     Signed, Rollene Rotunda, MD  07/12/2020 4:48 PM    [facility]

## 2020-09-13 ENCOUNTER — Other Ambulatory Visit: Payer: Self-pay | Admitting: Family Medicine

## 2020-09-13 DIAGNOSIS — N401 Enlarged prostate with lower urinary tract symptoms: Secondary | ICD-10-CM

## 2020-09-13 NOTE — Telephone Encounter (Signed)
Pharmacy comment:  Alternative Requested: DRUG NO LONGER ON FORMULARY WITH NEW INSURANCE; PLEASE CALL TO TRY FOR PRIOR AUTH/OVERRIDE OR SEND ALTERNATIVE.

## 2020-09-13 NOTE — Telephone Encounter (Signed)
Please inform patient.  Ok to do televisit if he wants to discuss alternatives.  Please see what is actually on his formulary

## 2020-09-14 ENCOUNTER — Other Ambulatory Visit: Payer: Self-pay | Admitting: Family Medicine

## 2020-09-14 DIAGNOSIS — R351 Nocturia: Secondary | ICD-10-CM

## 2020-09-14 DIAGNOSIS — N401 Enlarged prostate with lower urinary tract symptoms: Secondary | ICD-10-CM

## 2020-09-15 NOTE — Telephone Encounter (Signed)
Please see my previous note

## 2020-09-15 NOTE — Telephone Encounter (Signed)
Pharmacy comment:  Alternative Requested: DRUG NOT COVERED ON NEW INSURANCE PLAN; PLEASE CALL INS TO TRY TO GET APPROVED OR SEND ALTERNATIVE.

## 2020-09-20 ENCOUNTER — Telehealth: Payer: Self-pay

## 2020-09-20 DIAGNOSIS — E785 Hyperlipidemia, unspecified: Secondary | ICD-10-CM

## 2020-09-20 DIAGNOSIS — Z720 Tobacco use: Secondary | ICD-10-CM

## 2020-09-20 DIAGNOSIS — I25700 Atherosclerosis of coronary artery bypass graft(s), unspecified, with unstable angina pectoris: Secondary | ICD-10-CM

## 2020-09-20 DIAGNOSIS — E1169 Type 2 diabetes mellitus with other specified complication: Secondary | ICD-10-CM

## 2020-09-20 NOTE — Telephone Encounter (Signed)
Pt has [facility] now and needs all of his meds sent to [facility] for refill.

## 2020-09-21 MED ORDER — BUPROPION HCL ER (XL) 150 MG PO TB24: 150.0000 mg | ORAL_TABLET | Freq: Every day | ORAL | 0 refills | Status: DC

## 2020-09-21 MED ORDER — ATORVASTATIN CALCIUM 40 MG PO TABS: 40.0000 mg | ORAL_TABLET | Freq: Every day | ORAL | 0 refills | Status: DC

## 2020-09-21 MED ORDER — METFORMIN HCL 500 MG PO TABS: 500.0000 mg | ORAL_TABLET | Freq: Two times a day (BID) | ORAL | 0 refills | Status: DC

## 2020-09-21 NOTE — Telephone Encounter (Signed)
LMOVM Appt made for 11/06/20 & refill sent to [facility] pharmacy. Instructed pt to call back if he needs to reschedule this appt.

## 2020-09-26 ENCOUNTER — Other Ambulatory Visit: Payer: Self-pay | Admitting: Family Medicine

## 2020-09-26 ENCOUNTER — Telehealth: Payer: Self-pay | Admitting: *Deleted

## 2020-09-26 DIAGNOSIS — R351 Nocturia: Secondary | ICD-10-CM

## 2020-09-26 DIAGNOSIS — N401 Enlarged prostate with lower urinary tract symptoms: Secondary | ICD-10-CM

## 2020-09-26 MED ORDER — TADALAFIL 2.5 MG PO TABS: 1.0000 | ORAL_TABLET | Freq: Every day | ORAL | 3 refills | Status: DC

## 2020-09-26 NOTE — Telephone Encounter (Signed)
Patient told was $16 through [facility] [employer]. Wants to just pay out of pocket. Does not want alternative.

## 2020-09-26 NOTE — Telephone Encounter (Signed)
This was a fax a refill request from [facility] I just called going off the last ones noted [facility]

## 2020-09-26 NOTE — Telephone Encounter (Signed)
No prob. It was sent

## 2020-09-26 NOTE — Telephone Encounter (Signed)
Fax from [facility] pharmacy RF request for Tadalafil 2.5 mg tabs  TC to [facility], asked what alternatives were on formulary, since we got message from local pharmacy that above Rx was not on formulary and to try to get PA  Dutasteride, Terazosin, Tamsulosin Pt's appt is 11/06/20

## 2020-10-05 ENCOUNTER — Telehealth: Payer: Self-pay | Admitting: *Deleted

## 2020-10-05 NOTE — Telephone Encounter (Signed)
We received fax from [facility] for prior authorization to be completed on tadalfil 2.5mg . Per previous telephone note patient was going to pay out of pocket for medication. I just need to verify whether or not we need to complete PA for patient or is he paying out of pocket for medication.

## 2020-10-09 NOTE — Telephone Encounter (Signed)
[facility] pays [facility] for this.  (blue sticky note reminder at top for future help to remember)

## 2020-11-06 ENCOUNTER — Other Ambulatory Visit: Payer: Self-pay

## 2020-11-06 ENCOUNTER — Ambulatory Visit (INDEPENDENT_AMBULATORY_CARE_PROVIDER_SITE_OTHER): Payer: Medicare HMO | Admitting: Family Medicine

## 2020-11-06 VITALS — BP 118/62 | HR 69 | Temp 97.2°F | Ht 65.0 in | Wt 249.0 lb

## 2020-11-06 DIAGNOSIS — R351 Nocturia: Secondary | ICD-10-CM | POA: Diagnosis not present

## 2020-11-06 DIAGNOSIS — N401 Enlarged prostate with lower urinary tract symptoms: Secondary | ICD-10-CM

## 2020-11-06 DIAGNOSIS — E118 Type 2 diabetes mellitus with unspecified complications: Secondary | ICD-10-CM

## 2020-11-06 DIAGNOSIS — E1169 Type 2 diabetes mellitus with other specified complication: Secondary | ICD-10-CM | POA: Diagnosis not present

## 2020-11-06 DIAGNOSIS — I25700 Atherosclerosis of coronary artery bypass graft(s), unspecified, with unstable angina pectoris: Secondary | ICD-10-CM

## 2020-11-06 DIAGNOSIS — Z23 Encounter for immunization: Secondary | ICD-10-CM

## 2020-11-06 DIAGNOSIS — E785 Hyperlipidemia, unspecified: Secondary | ICD-10-CM

## 2020-11-06 LAB — BAYER DCA HB A1C WAIVED: HB A1C (BAYER DCA - WAIVED): 6.9 % (ref <7.0)

## 2020-11-06 MED ORDER — METFORMIN HCL ER 500 MG PO TB24: 500.0000 mg | ORAL_TABLET | Freq: Every day | ORAL | 3 refills | Status: DC

## 2020-11-06 MED ORDER — TADALAFIL 2.5 MG PO TABS: 1.0000 | ORAL_TABLET | Freq: Every day | ORAL | 3 refills | Status: DC

## 2020-11-06 NOTE — Progress Notes (Signed)
Subjective: CC: DM PCP: Janora Norlander, DO Randall Hall is a 65 y.o. male presenting to clinic today for:  1. Type 2 Diabetes with hypertension, hyperlipidemia:  Has not really checked his blood sugar.  When he does it is never been in the 300-400s.  He is taking Metformin 500 mg immediate release only once daily.  He did have an episode of angina and this was relieved by 2 aspirins.  He is not had any recurrent issues.  No reports of shortness of breath, edema, headaches.  Saw Dr. Percival Spanish in November.  Is planning to see him again soon given recent episode of angina.  He does not even keep his nitro on hand because he wants to keep using the Cialis.  He knows that he cannot use these in conjunction with each other.  Last eye exam: Needs Last foot exam: UTD Last A1c:  Lab Results  Component Value Date   HGBA1C 6.8 03/17/2020   Nephropathy screen indicated?: UTD Last flu, zoster and/or pneumovax: Pneumococcal today   There is no immunization history on file for this patient.  ROS: Denies numbness and tingling of the feet.  No foot ulcerations.  2.  BPH, ED Patient reports that his nocturia has improved a lot with the Cialis but unfortunately his insurance will not cover this.  He uses the medication for erectile dysfunction as well and [facility].  He wants to know if there is somewhere else he can get this for cheaper.  ROS: Per HPI  Allergies  Allergen Reactions   Sulfa Antibiotics Anaphylaxis    hives swelling   Past Medical History:  Diagnosis Date   AKI (acute kidney injury) ([place]) 07/14/2016   CAD (coronary artery disease)    History of myocardial infarction    Hx of CABG    Hyperlipemia    MI (myocardial infarction) (HCC)    Tobacco abuse     Current Outpatient Medications:    aspirin EC 81 MG tablet, Take 1 tablet (81 mg total) by mouth daily., Disp: 90 tablet, Rfl: 3   atorvastatin  (LIPITOR) 40 MG tablet, Take 1 tablet (40 mg total) by mouth daily at 6 PM., Disp: 90 tablet, Rfl: 0   azelastine (ASTELIN) 0.1 % nasal spray, Place 1 spray into both nostrils 2 (two) times daily. (Patient taking differently: Place 1 spray into both nostrils 2 (two) times daily as needed. ), Disp: 30 mL, Rfl: 12   BLACK CURRANT SEED OIL PO, Take 1 Dose by mouth daily., Disp: , Rfl:    buPROPion (WELLBUTRIN XL) 150 MG 24 hr tablet, Take 1 tablet (150 mg total) by mouth daily., Disp: 90 tablet, Rfl: 0   GARLIC PO, Take 1 tablet by mouth daily., Disp: , Rfl:    metFORMIN (GLUCOPHAGE) 500 MG tablet, Take 1 tablet (500 mg total) by mouth 2 (two) times daily with a meal., Disp: 180 tablet, Rfl: 0   nitroGLYCERIN (NITROSTAT) 0.4 MG SL tablet, Place 1 tablet (0.4 mg total) under the tongue every 5 (five) minutes as needed for chest pain., Disp: 25 tablet, Rfl: 0   Omega-3 Fatty Acids (FISH OIL PO), Take 800 mg by mouth daily., Disp: , Rfl:    Tadalafil 2.5 MG TABS, Take 1 tablet (2.5 mg total) by mouth daily., Disp: 90 tablet, Rfl: 3   TURMERIC PO, Take 1,000 mg by mouth daily., Disp: , Rfl:    VITAMIN E  PO, Take 2 capsules by mouth daily., Disp: , Rfl:  Social History   Socioeconomic History   Marital status: Married    Spouse name: Not on file   Number of children: 4   Years of education: Not on file   Highest education level: Not on file  Occupational History   Occupation: TRUCK DRIVER    Employer: [employer]  Tobacco Use   Smoking status: Current Every Day Smoker    Packs/day: 1.00    Years: 21.00    Pack years: 21.00    Types: Cigarettes   Smokeless tobacco: Never Used  Scientific laboratory technician Use: Never used  Substance and Sexual Activity   Alcohol use: Not Currently   Drug use: Not Currently   Sexual activity: Not on file  Other Topics Concern   Not on file  Social History Narrative   ** Merged History Encounter **       ** Data from: 12/28/16 Enc Dept:  [place] DEPT   Married.  He has seven grandchildren.        ** Data from: 03/23/17 Enc Dept: [place]   Lives with wife   Social Determinants of Health   Financial Resource Strain: Not on file  Food Insecurity: Not on file  Transportation Needs: Not on file  Physical Activity: Not on file  Stress: Not on file  Social Connections: Not on file  Intimate Partner Violence: Not on file   Family History  Problem Relation Age of Onset   Diabetes Mother    CAD Father 25       Died   CAD Brother     Objective: Office vital signs reviewed. BP 118/62    Pulse 69    Temp (!) 97.2 F (36.2 C) (Temporal)    Ht _0  (1.651 m)    Wt 249 lb (112.9 kg)    SpO2 95%    BMI 41.44 kg/m   Physical Examination:  General: Awake, alert, well nourished, No acute distress HEENT: Normal; sclera white Cardio: regular rate and rhythm, S1S2 heard, no murmurs appreciated Pulm: clear to auscultation bilaterally, no wheezes, rhonchi or rales; normal work of breathing on room air Extremities: warm, well perfused, No edema, cyanosis or clubbing; +2 pulses bilaterally MSK: Ambulating independently.  Normal tone  Assessment/ Plan: 65 y.o. male   Type 2 diabetes mellitus with complication, without long-term current use of insulin (HCC) - Plan: Bayer DCA Hb A1c Waived, metFORMIN (GLUCOPHAGE XR) 500 MG 24 hr tablet  Hyperlipidemia associated with type 2 diabetes mellitus ([place]) - Plan: CMP14+EGFR  Coronary artery disease involving coronary bypass graft of native heart with unstable angina pectoris ([place]) - Plan: CMP14+EGFR  Benign prostatic hyperplasia with nocturia - Plan: Tadalafil 2.5 MG TABS  Sugar remains controlled but is borderline at 6.9 today.  We discussed that he is not quite achieved the levels that are acceptable to come off of the Metformin.  I advised that he increase his exercise and reduce carbohydrate intake.  If he can obtain an A1c less than 6, we will discontinue  medications for diabetes treatment.  Check liver enzymes, kidney function.  Plan for fasting lipid panel at next visit.  Continue statin  1 episode of angina since his last visit with cardiology.  I agree with following up with Dr. Percival Spanish for recheck.  BPH is stable with Cialis.  This has been renewed and sent to [facility] drug.  Would strongly consider alternatives given recent  anginal episode and avoidance of nitro because of this class of medicine.  Could consider something like Flomax or Proscar instead.  Patient is reluctant to change  No orders of the defined types were placed in this encounter.  No orders of the defined types were placed in this encounter.    Janora Norlander, DO [place] [phone]

## 2020-11-06 NOTE — Patient Instructions (Signed)
A1c has gone up to 6.9.  You are borderline for needing more medications I do not think that this is the right time to come off of your Metformin.  Work on [employer] and increase physical activity and perhaps that will naturally bring her sugar down such that we can get rid of this pill.  I have changed you to the pill that is extended release that you only have to take 1/day.  New prescription has been sent to pharmacy  Your new prescription for Cialis has been sent to [facility] drug in [place].

## 2020-11-07 LAB — CMP14+EGFR
ALT: 22 IU/L (ref 0–44)
AST: 14 IU/L (ref 0–40)
Albumin/Globulin Ratio: 1.7 (ref 1.2–2.2)
Albumin: 4.3 g/dL (ref 3.8–4.8)
Alkaline Phosphatase: 91 IU/L (ref 44–121)
BUN/Creatinine Ratio: 14 (ref 10–24)
BUN: 17 mg/dL (ref 8–27)
Bilirubin Total: <0.2 mg/dL (ref 0.0–1.2)
CO2: 24 mmol/L (ref 20–29)
Calcium: 9.7 mg/dL (ref 8.6–10.2)
Chloride: 103 mmol/L (ref 96–106)
Creatinine, Ser: 1.22 mg/dL (ref 0.76–1.27)
Globulin, Total: 2.6 g/dL (ref 1.5–4.5)
Glucose: 153 mg/dL — ABNORMAL HIGH (ref 65–99)
Potassium: 4.6 mmol/L (ref 3.5–5.2)
Sodium: 142 mmol/L (ref 134–144)
Total Protein: 6.9 g/dL (ref 6.0–8.5)
eGFR: 66 mL/min/{1.73_m2} (ref >59)

## 2020-11-08 ENCOUNTER — Telehealth: Payer: Self-pay

## 2020-11-08 DIAGNOSIS — N401 Enlarged prostate with lower urinary tract symptoms: Secondary | ICD-10-CM

## 2020-11-08 MED ORDER — TADALAFIL 2.5 MG PO TABS: 1.0000 | ORAL_TABLET | Freq: Every day | ORAL | 3 refills | Status: DC

## 2020-11-08 NOTE — Telephone Encounter (Signed)
Please send [facility] [place]

## 2020-11-08 NOTE — Telephone Encounter (Signed)
Refill sent to the right pharmacy

## 2020-11-29 ENCOUNTER — Other Ambulatory Visit: Payer: Self-pay | Admitting: Family Medicine

## 2020-11-29 DIAGNOSIS — Z720 Tobacco use: Secondary | ICD-10-CM

## 2020-11-29 DIAGNOSIS — E1169 Type 2 diabetes mellitus with other specified complication: Secondary | ICD-10-CM

## 2020-11-29 DIAGNOSIS — E785 Hyperlipidemia, unspecified: Secondary | ICD-10-CM

## 2020-11-29 DIAGNOSIS — I25700 Atherosclerosis of coronary artery bypass graft(s), unspecified, with unstable angina pectoris: Secondary | ICD-10-CM

## 2021-02-24 ENCOUNTER — Other Ambulatory Visit: Payer: Self-pay | Admitting: Family Medicine

## 2021-02-24 DIAGNOSIS — N401 Enlarged prostate with lower urinary tract symptoms: Secondary | ICD-10-CM

## 2021-04-05 ENCOUNTER — Other Ambulatory Visit: Payer: Self-pay | Admitting: Family Medicine

## 2021-04-05 DIAGNOSIS — R351 Nocturia: Secondary | ICD-10-CM

## 2021-04-05 DIAGNOSIS — N401 Enlarged prostate with lower urinary tract symptoms: Secondary | ICD-10-CM

## 2021-04-05 NOTE — Telephone Encounter (Signed)
  Prescription Request  04/05/2021  What is the name of the medication or equipment? Cialas 2.5mg   Have you contacted your pharmacy to request a refill? (if applicable) no  Which pharmacy would you like this sent to? [facility]   Patient notified that their request is being sent to the clinical staff for review and that they should receive a response within 2 business days.    Gottschalk's pt  [facility] now.

## 2021-04-06 ENCOUNTER — Telehealth: Payer: Self-pay | Admitting: Family Medicine

## 2021-04-06 DIAGNOSIS — R351 Nocturia: Secondary | ICD-10-CM

## 2021-04-06 DIAGNOSIS — N401 Enlarged prostate with lower urinary tract symptoms: Secondary | ICD-10-CM

## 2021-04-06 MED ORDER — TADALAFIL 2.5 MG PO TABS: 1.0000 | ORAL_TABLET | Freq: Every day | ORAL | 3 refills | Status: DC

## 2021-04-06 NOTE — Telephone Encounter (Signed)
Pt called stating that we sent his Cialas Rx to wrong pharmacy. Needs to be sent to [facility].

## 2021-04-06 NOTE — Telephone Encounter (Signed)
Pt aware refill corrected

## 2021-05-18 ENCOUNTER — Encounter: Payer: Medicare HMO | Admitting: Family Medicine

## 2021-07-27 ENCOUNTER — Encounter: Payer: Self-pay | Admitting: Family Medicine

## 2021-07-27 ENCOUNTER — Ambulatory Visit (INDEPENDENT_AMBULATORY_CARE_PROVIDER_SITE_OTHER): Payer: Medicare HMO | Admitting: Family Medicine

## 2021-07-27 VITALS — BP 134/80 | HR 55 | Temp 97.1°F | Ht 65.0 in | Wt 259.4 lb

## 2021-07-27 DIAGNOSIS — E785 Hyperlipidemia, unspecified: Secondary | ICD-10-CM

## 2021-07-27 DIAGNOSIS — Z0000 Encounter for general adult medical examination without abnormal findings: Secondary | ICD-10-CM

## 2021-07-27 DIAGNOSIS — Z1211 Encounter for screening for malignant neoplasm of colon: Secondary | ICD-10-CM

## 2021-07-27 DIAGNOSIS — F17210 Nicotine dependence, cigarettes, uncomplicated: Secondary | ICD-10-CM

## 2021-07-27 DIAGNOSIS — N401 Enlarged prostate with lower urinary tract symptoms: Secondary | ICD-10-CM | POA: Diagnosis not present

## 2021-07-27 DIAGNOSIS — Z0001 Encounter for general adult medical examination with abnormal findings: Secondary | ICD-10-CM

## 2021-07-27 DIAGNOSIS — E1169 Type 2 diabetes mellitus with other specified complication: Secondary | ICD-10-CM | POA: Diagnosis not present

## 2021-07-27 DIAGNOSIS — E118 Type 2 diabetes mellitus with unspecified complications: Secondary | ICD-10-CM | POA: Diagnosis not present

## 2021-07-27 DIAGNOSIS — J3489 Other specified disorders of nose and nasal sinuses: Secondary | ICD-10-CM

## 2021-07-27 DIAGNOSIS — I25700 Atherosclerosis of coronary artery bypass graft(s), unspecified, with unstable angina pectoris: Secondary | ICD-10-CM | POA: Diagnosis not present

## 2021-07-27 DIAGNOSIS — Z122 Encounter for screening for malignant neoplasm of respiratory organs: Secondary | ICD-10-CM

## 2021-07-27 DIAGNOSIS — R351 Nocturia: Secondary | ICD-10-CM

## 2021-07-27 LAB — BAYER DCA HB A1C WAIVED: HB A1C (BAYER DCA - WAIVED): 7.1 % — ABNORMAL HIGH (ref 4.8–5.6)

## 2021-07-27 MED ORDER — ATORVASTATIN CALCIUM 40 MG PO TABS: ORAL_TABLET | ORAL | 3 refills | Status: DC

## 2021-07-27 MED ORDER — AZELASTINE HCL 0.1 % NA SOLN: 1.0000 | Freq: Two times a day (BID) | NASAL | 12 refills | Status: DC

## 2021-07-27 MED ORDER — METFORMIN HCL ER 500 MG PO TB24: 500.0000 mg | ORAL_TABLET | Freq: Every day | ORAL | 3 refills | Status: DC

## 2021-07-27 MED ORDER — TADALAFIL 2.5 MG PO TABS: 1.0000 | ORAL_TABLET | Freq: Every day | ORAL | 3 refills | Status: DC

## 2021-07-27 NOTE — Patient Instructions (Signed)
CT scan for lung cancer screen ordered Cologuard kit will be sent in mail.  Send back ASAP  You had labs performed today.  You will be contacted with the results of the labs once they are available, usually in the next 3 business days for routine lab work.  If you have an active my chart account, they will be released to your MyChart.  If you prefer to have these labs released to you via telephone, please let us know.  If you had a pap smear or biopsy performed, expect to be contacted in about 7-10 days. Preventive Care 40 Years and Older, Male Preventive care refers to lifestyle choices and visits with your health care provider that can promote health and wellness. Preventive care visits are also called wellness exams. What can I expect for my preventive care visit? Counseling During your preventive care visit, your health care provider may ask about your: Medical history, including: Past medical problems. Family medical history. History of falls. Current health, including: Emotional well-being. Home life and relationship well-being. Sexual activity. Memory and ability to understand (cognition). Lifestyle, including: Alcohol, nicotine or tobacco, and drug use. Access to firearms. Diet, exercise, and sleep habits. Work and work Statistician. Sunscreen use. Safety issues such as seatbelt and bike helmet use. Physical exam Your health care provider will check your: Height and weight. These may be used to calculate your BMI (body mass index). BMI is a measurement that tells if you are at a healthy weight. Waist circumference. This measures the distance around your waistline. This measurement also tells if you are at a healthy weight and may help predict your risk of certain diseases, such as type 2 diabetes and high blood pressure. Heart rate and blood pressure. Body temperature. Skin for abnormal spots. What immunizations do I need? Vaccines are usually given at various ages,  according to a schedule. Your health care provider will recommend vaccines for you based on your age, medical history, and lifestyle or other factors, such as travel or where you work. What tests do I need? Screening Your health care provider may recommend screening tests for certain conditions. This may include: Lipid and cholesterol levels. Diabetes screening. This is done by checking your blood sugar (glucose) after you have not eaten for a while (fasting). Hepatitis C test. Hepatitis B test. HIV (human immunodeficiency virus) test. STI (sexually transmitted infection) testing, if you are at risk. Lung cancer screening. Colorectal cancer screening. Prostate cancer screening. Abdominal aortic aneurysm (AAA) screening. You may need this if you are a current or former smoker. Talk with your health care provider about your test results, treatment options, and if necessary, the need for more tests. Follow these instructions at home: Eating and drinking  Eat a diet that includes fresh fruits and vegetables, whole grains, lean protein, and low-fat dairy products. Limit your intake of foods with high amounts of sugar, saturated fats, and salt. Take vitamin and mineral supplements as recommended by your health care provider. Do not drink alcohol if your health care provider tells you not to drink. If you drink alcohol: Limit how much you have to 0-2 drinks a day. Know how much alcohol is in your drink. In the [place] one drink equals one 12 oz bottle of beer (355 mL), one 5 oz glass of wine (148 mL), or one 1 oz glass of hard liquor (44 mL). Lifestyle Brush your teeth every morning and night with fluoride toothpaste. Floss one time each day. Exercise for at least  30 minutes 5 or more days each week. Do not use any products that contain nicotine or tobacco. These products include cigarettes, chewing tobacco, and vaping devices, such as e-cigarettes. If you need help quitting, ask your health care  provider. Do not use drugs. If you are sexually active, practice safe sex. Use a condom or other form of protection to prevent STIs. Take aspirin only as told by your health care provider. Make sure that you understand how much to take and what form to take. Work with your health care provider to find out whether it is safe and beneficial for you to take aspirin daily. Ask your health care provider if you need to take a cholesterol-lowering medicine (statin). Find healthy ways to manage stress, such as: Meditation, yoga, or listening to music. Journaling. Talking to a trusted person. Spending time with friends and family. Safety Always wear your seat belt while driving or riding in a vehicle. Do not drive: If you have been drinking alcohol. Do not ride with someone who has been drinking. When you are tired or distracted. While texting. If you have been using any mind-altering substances or drugs. Wear a helmet and other protective equipment during sports activities. If you have firearms in your house, make sure you follow all gun safety procedures. Minimize exposure to UV radiation to reduce your risk of skin cancer. What's next? Visit your health care provider once a year for an annual wellness visit. Ask your health care provider how often you should have your eyes and teeth checked. Stay up to date on all vaccines. This information is not intended to replace advice given to you by your health care provider. Make sure you discuss any questions you have with your health care provider. Document Revised: 02/07/2021 Document Reviewed: 02/07/2021 Elsevier Patient Education  [place].

## 2021-07-27 NOTE — Progress Notes (Signed)
Randall Hall is a 65 y.o. male presents to office today for annual physical exam examination.    Concerns today include: 1. Type 2 Diabetes with hypertension, hyperlipidemia, tobacco use disorder:  Patient is compliant with his Lipitor, metformin.  He continues to smoke about a pack per day and has done so for about 34 years.  Last eye exam: He will schedule Last foot exam: Needs Last A1c:  Lab Results  Component Value Date   HGBA1C 7.1 (H) 07/27/2021   Nephropathy screen indicated?:  Needs Last flu, zoster and/or pneumovax:  Immunization History  Administered Date(s) Administered   Pneumococcal Conjugate-13 11/06/2020    ROS: Denies any chest pain, shortness of breath, edema.  Substance use: 1 pack/day of cigarettes for over 34 years.  No drug or alcohol use Diet: Fair, Exercise: No structured Last eye exam: Needs Last colonoscopy: Cologuard requested.  No family history of colon cancer.  No rectal bleeding Refills needed today: Astelin and Cialis Immunizations needed: Immunization History  Administered Date(s) Administered   Pneumococcal Conjugate-13 11/06/2020     Past Medical History:  Diagnosis Date   AKI (acute kidney injury) (HCC) 07/14/2016   CAD (coronary artery disease)    History of myocardial infarction    Hx of CABG    Hyperlipemia    MI (myocardial infarction) (HCC)    Tobacco abuse    Social History   Socioeconomic History   Marital status: Married    Spouse name: Not on file   Number of children: 4   Years of education: Not on file   Highest education level: Not on file  Occupational History   Occupation: TRUCK DRIVER    Employer: [employer]  Tobacco Use   Smoking status: Every Day    Packs/day: 1.00    Years: 21.00    Pack years: 21.00    Types: Cigarettes   Smokeless tobacco: Never  Vaping Use   Vaping Use: Never used  Substance and Sexual Activity   Alcohol use: Not Currently   Drug use: Not Currently   Sexual  activity: Not on file  Other Topics Concern   Not on file  Social History Narrative   ** Merged History Encounter **       ** Data from: 12/28/16 Enc Dept: [facility] DE[facility]   Married.  He has seven grandchildren.        ** Data from: 03/23/17 Enc Dept: [facility]   Lives with wife   Social Determinants of Health   Financial Resource Strain: Not on file  Food Insecurity: Not on file  Transportation Needs: Not on file  Physical Activity: Not on file  Stress: Not on file  Social Connections: Not on file  Intimate Partner Violence: Not on file   Past Surgical History:  Procedure Laterality Date   CARDIAC CATHETERIZATION N/A 07/16/2016   Procedure: Left Heart Cath and Cors/Grafts Angiography;  Surgeon: Kathleene Hazel, MD;  Location: [facility] INVASIVE CV LAB;  Service: Cardiovascular;  Laterality: N/A;   CARDIAC CATHETERIZATION N/A 07/16/2016   Procedure: Coronary Stent Intervention;  Surgeon: Kathleene Hazel, MD;  Location: [facility] INVASIVE CV LAB;  Service: Cardiovascular;  Laterality: N/A;   CORONARY ANGIOPLASTY WITH STENT PLACEMENT     CORONARY ARTERY BYPASS GRAFT     2004 Dr. Laneta Simmers LIMA to the LAD, SVG to PDA and posterior lateral, SVG to diagonal, RIMA to obtuse marginal.  Last catheterization 2008.   LIPOMA EXCISION Left 07/02/2019   Procedure: MINOR  EXCISION OF CYST LEFT THIGH;  Surgeon: Lucretia Roers, MD;  Location: [facility] ORS;  Service: General;  Laterality: Left;  Pt to arrive at 8:00am   Family History  Problem Relation Age of Onset   Diabetes Mother    CAD Father 107       Died   CAD Brother     Current Outpatient Medications:    aspirin EC 81 MG tablet, Take 1 tablet (81 mg total) by mouth daily., Disp: 90 tablet, Rfl: 3   BLACK CURRANT SEED OIL PO, Take 1 Dose by mouth daily., Disp: , Rfl:    GARLIC PO, Take 1 tablet by mouth daily., Disp: , Rfl:    Omega-3 Fatty Acids (FISH OIL PO), Take 800 mg by mouth daily., Disp: , Rfl:    TURMERIC PO, Take  1,000 mg by mouth daily., Disp: , Rfl:    VITAMIN E PO, Take 2 capsules by mouth daily., Disp: , Rfl:    atorvastatin (LIPITOR) 40 MG tablet, TAKE 1 TABLET EVERY DAY  AT  6PM, Disp: 90 tablet, Rfl: 3   azelastine (ASTELIN) 0.1 % nasal spray, Place 1 spray into both nostrils 2 (two) times daily., Disp: 30 mL, Rfl: 12   metFORMIN (GLUCOPHAGE XR) 500 MG 24 hr tablet, Take 1 tablet (500 mg total) by mouth daily with breakfast., Disp: 90 tablet, Rfl: 3   Tadalafil 2.5 MG TABS, Take 1 tablet (2.5 mg total) by mouth daily., Disp: 90 tablet, Rfl: 3  Allergies  Allergen Reactions   Sulfa Antibiotics Anaphylaxis    hives swelling     ROS: Review of Systems A comprehensive review of systems was negative.    Physical exam BP 134/80   Pulse (!) 55   Temp (!) 97.1 F (36.2 C)   Ht 5\' 5"  (1.651 m)   Wt 259 lb 6.4 oz (117.7 kg)   SpO2 94%   BMI 43.17 kg/m  General appearance: alert, cooperative, appears stated age, no distress, and moderately obese Head: Normocephalic, without obvious abnormality, atraumatic Eyes: negative findings: lids and lashes normal, conjunctivae and sclerae normal, corneas clear, and pupils equal, round, reactive to light and accomodation Ears: normal TM's and external ear canals both ears Nose: Nares normal. Septum midline. Mucosa normal. No drainage or sinus tenderness. Throat:  Oropharynx without erythema or masses.  No sublingual lesions appreciated Neck: no adenopathy, no carotid bruit, supple, symmetrical, trachea midline, and thyroid not enlarged, symmetric, no tenderness/mass/nodules Back: symmetric, no curvature. ROM normal. No CVA tenderness. Lungs: clear to auscultation bilaterally Chest wall: no tenderness Heart: regular rate and rhythm, S1, S2 normal, no murmur, click, rub or gallop Abdomen: soft, non-tender; bowel sounds normal; no masses,  no organomegaly and obese Extremities: extremities normal, atraumatic, no cyanosis or edema Pulses: 2+ and  symmetric Skin: Skin color, texture, turgor normal. No rashes or lesions Lymph nodes: Cervical, supraclavicular, and axillary nodes normal. Neurologic: Grossly normal Diabetic Foot Exam - Simple   Simple Foot Form  07/27/2021  4:22 PM  Visual Inspection No deformities, no ulcerations, no other skin breakdown bilaterally: Yes Sensation Testing Intact to touch and monofilament testing bilaterally: Yes Pulse Check Posterior Tibialis and Dorsalis pulse intact bilaterally: Yes Comments      Assessment/ Plan: 14/09/2020 here for annual physical exam.   Annual physical exam  Type 2 diabetes mellitus with other specified complication, without long-term current use of insulin (HCC) - Plan: Lipid panel, Bayer DCA Hb A1c Waived, Microalbumin / creatinine urine ratio, metFORMIN (  GLUCOPHAGE XR) 500 MG 24 hr tablet  Hyperlipidemia associated with type 2 diabetes mellitus (HCC) - Plan: atorvastatin (LIPITOR) 40 MG tablet  Coronary artery disease involving coronary bypass graft of native heart with unstable angina pectoris (HCC) - Plan: atorvastatin (LIPITOR) 40 MG tablet  Screening for lung cancer - Plan: CT CHEST LUNG CA SCREEN LOW DOSE W/O CM  Smokes less than 1 pack a day with greater than 30 pack year history - Plan: CT CHEST LUNG CA SCREEN LOW DOSE W/O CM  Screen for colon cancer - Plan: Cologuard  Rhinorrhea - Plan: azelastine (ASTELIN) 0.1 % nasal spray  Benign prostatic hyperplasia with nocturia - Plan: Tadalafil 2.5 MG TABS  Sugars are slightly out of range with A1c of 7.1 today.  Reinforced carb restriction.  Will not make any med changes today but if persistently abnormal at her next visit we will need to increase his metformin dosing.  Nonfasting lipid, urine microalbumin obtained today  Continue statin given history of CAD.  Lipitor renewed  Discussed risk versus benefits of lung cancer screening and he wishes to proceed.  CT ordered.  Has greater than 30-pack-year history  with 34 years of smoking 1 pack/day but currently only smoking about 3/4 pack/day x3 years  Cologuard ordered.  He appears to be average risk  Astelin nasal spray renewed  Cialis renewed  Counseled on healthy lifestyle choices, including diet (rich in fruits, vegetables and lean meats and low in salt and simple carbohydrates) and exercise (at least 30 minutes of moderate physical activity daily).   Cher Egnor M. Nadine Counts, DO

## 2021-07-28 LAB — LIPID PANEL
Chol/HDL Ratio: 6 ratio — ABNORMAL HIGH (ref 0.0–5.0)
Cholesterol, Total: 149 mg/dL (ref 100–199)
HDL: 25 mg/dL — ABNORMAL LOW (ref >39)
LDL Chol Calc (NIH): 84 mg/dL (ref 0–99)
Triglycerides: 234 mg/dL — ABNORMAL HIGH (ref 0–149)
VLDL Cholesterol Cal: 40 mg/dL (ref 5–40)

## 2021-07-30 ENCOUNTER — Encounter: Payer: Self-pay | Admitting: Family Medicine

## 2021-08-01 DIAGNOSIS — R112 Nausea with vomiting, unspecified: Secondary | ICD-10-CM | POA: Diagnosis not present

## 2021-08-01 DIAGNOSIS — R42 Dizziness and giddiness: Secondary | ICD-10-CM | POA: Diagnosis not present

## 2021-08-01 DIAGNOSIS — E119 Type 2 diabetes mellitus without complications: Secondary | ICD-10-CM | POA: Diagnosis not present

## 2021-08-01 DIAGNOSIS — R0602 Shortness of breath: Secondary | ICD-10-CM | POA: Diagnosis not present

## 2021-08-01 DIAGNOSIS — J1000 Influenza due to other identified influenza virus with unspecified type of pneumonia: Secondary | ICD-10-CM | POA: Diagnosis not present

## 2021-08-01 DIAGNOSIS — R509 Fever, unspecified: Secondary | ICD-10-CM | POA: Diagnosis not present

## 2021-08-01 DIAGNOSIS — E785 Hyperlipidemia, unspecified: Secondary | ICD-10-CM | POA: Diagnosis not present

## 2021-08-01 DIAGNOSIS — Z720 Tobacco use: Secondary | ICD-10-CM | POA: Diagnosis not present

## 2021-08-01 DIAGNOSIS — Z951 Presence of aortocoronary bypass graft: Secondary | ICD-10-CM | POA: Diagnosis not present

## 2021-08-01 DIAGNOSIS — R197 Diarrhea, unspecified: Secondary | ICD-10-CM | POA: Diagnosis not present

## 2021-08-01 DIAGNOSIS — R55 Syncope and collapse: Secondary | ICD-10-CM | POA: Diagnosis not present

## 2021-08-01 DIAGNOSIS — R062 Wheezing: Secondary | ICD-10-CM | POA: Diagnosis not present

## 2021-08-13 ENCOUNTER — Other Ambulatory Visit: Payer: Self-pay | Admitting: Family Medicine

## 2021-08-13 DIAGNOSIS — I25700 Atherosclerosis of coronary artery bypass graft(s), unspecified, with unstable angina pectoris: Secondary | ICD-10-CM

## 2021-08-13 DIAGNOSIS — E785 Hyperlipidemia, unspecified: Secondary | ICD-10-CM

## 2021-08-28 ENCOUNTER — Ambulatory Visit: Payer: Medicare HMO

## 2021-10-01 ENCOUNTER — Ambulatory Visit (INDEPENDENT_AMBULATORY_CARE_PROVIDER_SITE_OTHER): Payer: Medicare HMO

## 2021-10-01 VITALS — Wt 252.0 lb

## 2021-10-01 DIAGNOSIS — Z0000 Encounter for general adult medical examination without abnormal findings: Secondary | ICD-10-CM

## 2021-10-01 NOTE — Patient Instructions (Signed)
Randall Hall , Thank you for taking time to come for your Medicare Wellness Visit. I appreciate your ongoing commitment to your health goals. Please review the following plan we discussed and let me know if I can assist you in the future.   Screening recommendations/referrals: Colonoscopy: Declined at this time Recommended yearly ophthalmology/optometry visit for glaucoma screening and checkup Recommended yearly dental visit for hygiene and checkup  Vaccinations: Influenza vaccine: Declined - recommended annually in fall Pneumococcal vaccine: Done 11/06/2020 - next get Pneumovax-23 Tdap vaccine: Declined - recommended every 10 years Shingles vaccine: Declined - recommended 2 doses 6 months apart per lifetime - 90% effective   Covid-19: Declined  Advanced directives: Advance directive discussed with you today. Even though you declined this today, please call our office should you change your mind, and we can give you the proper paperwork for you to fill out.   Conditions/risks identified: Aim for 30 minutes of exercise or brisk walking each day, drink 6-8 glasses of water and eat lots of fruits and vegetables.   Next appointment: Follow up in one year for your annual wellness visit.   Preventive Care 67 Years and Older, Male  Preventive care refers to lifestyle choices and visits with your health care provider that can promote health and wellness. What does preventive care include? A yearly physical exam. This is also called an annual well check. Dental exams once or twice a year. Routine eye exams. Ask your health care provider how often you should have your eyes checked. Personal lifestyle choices, including: Daily care of your teeth and gums. Regular physical activity. Eating a healthy diet. Avoiding tobacco and drug use. Limiting alcohol use. Practicing safe sex. Taking low doses of aspirin every day. Taking vitamin and mineral supplements as recommended by your health care  provider. What happens during an annual well check? The services and screenings done by your health care provider during your annual well check will depend on your age, overall health, lifestyle risk factors, and family history of disease. Counseling  Your health care provider may ask you questions about your: Alcohol use. Tobacco use. Drug use. Emotional well-being. Home and relationship well-being. Sexual activity. Eating habits. History of falls. Memory and ability to understand (cognition). Work and work Astronomer. Screening  You may have the following tests or measurements: Height, weight, and BMI. Blood pressure. Lipid and cholesterol levels. These may be checked every 5 years, or more frequently if you are over 77 years old. Skin check. Lung cancer screening. You may have this screening every year starting at age 14 if you have a 30-pack-year history of smoking and currently smoke or have quit within the past 15 years. Fecal occult blood test (FOBT) of the stool. You may have this test every year starting at age 52. Flexible sigmoidoscopy or colonoscopy. You may have a sigmoidoscopy every 5 years or a colonoscopy every 10 years starting at age 32. Prostate cancer screening. Recommendations will vary depending on your family history and other risks. Hepatitis C blood test. Hepatitis B blood test. Sexually transmitted disease (STD) testing. Diabetes screening. This is done by checking your blood sugar (glucose) after you have not eaten for a while (fasting). You may have this done every 1-3 years. Abdominal aortic aneurysm (AAA) screening. You may need this if you are a current or former smoker. Osteoporosis. You may be screened starting at age 39 if you are at high risk. Talk with your health care provider about your test results, treatment options,  and if necessary, the need for more tests. Vaccines  Your health care provider may recommend certain vaccines, such  as: Influenza vaccine. This is recommended every year. Tetanus, diphtheria, and acellular pertussis (Tdap, Td) vaccine. You may need a Td booster every 10 years. Zoster vaccine. You may need this after age 37. Pneumococcal 13-valent conjugate (PCV13) vaccine. One dose is recommended after age 61. Pneumococcal polysaccharide (PPSV23) vaccine. One dose is recommended after age 43. Talk to your health care provider about which screenings and vaccines you need and how often you need them. This information is not intended to replace advice given to you by your health care provider. Make sure you discuss any questions you have with your health care provider. Document Released: 09/08/2015 Document Revised: 05/01/2016 Document Reviewed: 06/13/2015 Elsevier Interactive Patient Education  2017 [place] Prevention in the Home Falls can cause injuries. They can happen to people of all ages. There are many things you can do to make your home safe and to help prevent falls. What can I do on the outside of my home? Regularly fix the edges of walkways and driveways and fix any cracks. Remove anything that might make you trip as you walk through a door, such as a raised step or threshold. Trim any bushes or trees on the path to your home. Use bright outdoor lighting. Clear any walking paths of anything that might make someone trip, such as rocks or tools. Regularly check to see if handrails are loose or broken. Make sure that both sides of any steps have handrails. Any raised decks and porches should have guardrails on the edges. Have any leaves, snow, or ice cleared regularly. Use sand or salt on walking paths during winter. Clean up any spills in your garage right away. This includes oil or grease spills. What can I do in the bathroom? Use night lights. Install grab bars by the toilet and in the tub and shower. Do not use towel bars as grab bars. Use non-skid mats or decals in the tub or  shower. If you need to sit down in the shower, use a plastic, non-slip stool. Keep the floor dry. Clean up any water that spills on the floor as soon as it happens. Remove soap buildup in the tub or shower regularly. Attach bath mats securely with double-sided non-slip rug tape. Do not have throw rugs and other things on the floor that can make you trip. What can I do in the bedroom? Use night lights. Make sure that you have a light by your bed that is easy to reach. Do not use any sheets or blankets that are too big for your bed. They should not hang down onto the floor. Have a firm chair that has side arms. You can use this for support while you get dressed. Do not have throw rugs and other things on the floor that can make you trip. What can I do in the kitchen? Clean up any spills right away. Avoid walking on wet floors. Keep items that you use a lot in easy-to-reach places. If you need to reach something above you, use a strong step stool that has a grab bar. Keep electrical cords out of the way. Do not use floor polish or wax that makes floors slippery. If you must use wax, use non-skid floor wax. Do not have throw rugs and other things on the floor that can make you trip. What can I do with my stairs? Do not leave  any items on the stairs. Make sure that there are handrails on both sides of the stairs and use them. Fix handrails that are broken or loose. Make sure that handrails are as long as the stairways. Check any carpeting to make sure that it is firmly attached to the stairs. Fix any carpet that is loose or worn. Avoid having throw rugs at the top or bottom of the stairs. If you do have throw rugs, attach them to the floor with carpet tape. Make sure that you have a light switch at the top of the stairs and the bottom of the stairs. If you do not have them, ask someone to add them for you. What else can I do to help prevent falls? Wear shoes that: Do not have high heels. Have  rubber bottoms. Are comfortable and fit you well. Are closed at the toe. Do not wear sandals. If you use a stepladder: Make sure that it is fully opened. Do not climb a closed stepladder. Make sure that both sides of the stepladder are locked into place. Ask someone to hold it for you, if possible. Clearly mark and make sure that you can see: Any grab bars or handrails. First and last steps. Where the edge of each step is. Use tools that help you move around (mobility aids) if they are needed. These include: Canes. Walkers. Scooters. Crutches. Turn on the lights when you go into a dark area. Replace any light bulbs as soon as they burn out. Set up your furniture so you have a clear path. Avoid moving your furniture around. If any of your floors are uneven, fix them. If there are any pets around you, be aware of where they are. Review your medicines with your doctor. Some medicines can make you feel dizzy. This can increase your chance of falling. Ask your doctor what other things that you can do to help prevent falls. This information is not intended to replace advice given to you by your health care provider. Make sure you discuss any questions you have with your health care provider. Document Released: 06/08/2009 Document Revised: 01/18/2016 Document Reviewed: 09/16/2014 Elsevier Interactive Patient Education  2017 [employer].

## 2021-10-01 NOTE — Progress Notes (Signed)
Subjective:   Randall Hall is a 66 y.o. male who presents for an Initial Medicare Annual Wellness Visit.  Virtual Visit via Telephone Note  I connected with  Consepcion Hall on 10/01/21 at 12:00 PM EST by telephone and verified that I am speaking with the correct person using two identifiers.  Location: Patient: Home Provider: [facility] Persons participating in the virtual visit: patient/Nurse Health Advisor   I discussed the limitations, risks, security and privacy concerns of performing an evaluation and management service by telephone and the availability of in person appointments. The patient expressed understanding and agreed to proceed.  Interactive audio and video telecommunications were attempted between this nurse and patient, however failed, due to patient having technical difficulties OR patient did not have access to video capability.  We continued and completed visit with audio only.  Some vital signs may be absent or patient reported.   Randall Hall E Randall Ortlieb, LPN   Review of Systems     Cardiac Risk Factors include: advanced age (>46men, >43 women);diabetes mellitus;male gender;dyslipidemia;obesity (BMI >30kg/m2);sedentary lifestyle;smoking/ tobacco exposure;Other (see comment);family history of premature cardiovascular disease, Risk factor comments: CAD, hx of MI     Objective:    Today's Vitals   10/01/21 1204  Weight: 252 lb (114.3 kg)   Body mass index is 41.93 kg/m.  Advanced Directives 10/01/2021 12/28/2016 07/14/2016 07/14/2016  Does Patient Have a Medical Advance Directive? No No No No  Would patient like information on creating a medical advance directive? No - Patient declined - No - patient declined information No - patient declined information  Some encounter information is confidential and restricted. Go to Review Flowsheets activity to see all data.    Current Medications (verified) Outpatient Encounter Medications as of 10/01/2021  Medication Sig   aspirin EC  81 MG tablet Take 1 tablet (81 mg total) by mouth daily.   atorvastatin (LIPITOR) 40 MG tablet TAKE 1 TABLET EVERY DAY  AT  6PM   azelastine (ASTELIN) 0.1 % nasal spray Place 1 spray into both nostrils 2 (two) times daily.   BLACK CURRANT SEED OIL PO Take 1 Dose by mouth daily.   GARLIC PO Take 1 tablet by mouth daily.   metFORMIN (GLUCOPHAGE XR) 500 MG 24 hr tablet Take 1 tablet (500 mg total) by mouth daily with breakfast.   Omega-3 Fatty Acids (FISH OIL PO) Take 800 mg by mouth daily.   Tadalafil 2.5 MG TABS Take 1 tablet (2.5 mg total) by mouth daily.   TURMERIC PO Take 1,000 mg by mouth daily.   VITAMIN E PO Take 2 capsules by mouth daily.   No facility-administered encounter medications on file as of 10/01/2021.    Allergies (verified) Sulfa antibiotics   History: Past Medical History:  Diagnosis Date   AKI (acute kidney injury) (HCC) 07/14/2016   CAD (coronary artery disease)    History of myocardial infarction    Hx of CABG    Hyperlipemia    MI (myocardial infarction) (HCC)    Tobacco abuse    Past Surgical History:  Procedure Laterality Date   CARDIAC CATHETERIZATION N/A 07/16/2016   Procedure: Left Heart Cath and Cors/Grafts Angiography;  Surgeon: Kathleene Hazel, MD;  Location: [facility] INVASIVE CV LAB;  Service: Cardiovascular;  Laterality: N/A;   CARDIAC CATHETERIZATION N/A 07/16/2016   Procedure: Coronary Stent Intervention;  Surgeon: Kathleene Hazel, MD;  [facility] INVASIVE CV LAB;  Service: Cardiovascular;  Laterality: N/A;   CORONARY ANGIOPLASTY WITH STENT PLACEMENT  CORONARY ARTERY BYPASS GRAFT     2004 Dr. Laneta Simmers LIMA to the LAD, SVG to PDA and posterior lateral, SVG to diagonal, RIMA to obtuse marginal.  Last catheterization 2008.   LIPOMA EXCISION Left 07/02/2019   Procedure: MINOR EXCISION OF CYST LEFT THIGH;  Surgeon: Lucretia Roers, MD;  Location: [facility] ORS;  Service: General;  Laterality: Left;  Pt to arrive at 8:00am   Family History   Problem Relation Age of Onset   Diabetes Mother    CAD Father 91       Died   CAD Brother    Social History   Socioeconomic History   Marital status: Married    Spouse name: Not on file   Number of children: 4   Years of education: Not on file   Highest education level: Not on file  Occupational History   Occupation: TRUCK DRIVER    Employer: [employer]  Tobacco Use   Smoking status: Every Day    Packs/day: 1.00    Years: 21.00    Pack years: 21.00    Types: Cigarettes   Smokeless tobacco: Never  Vaping Use   Vaping Use: Never used  Substance and Sexual Activity   Alcohol use: Not Currently   Drug use: Not Currently   Sexual activity: Not on file  Other Topics Concern   Not on file  Social History Narrative   ** Merged History Encounter ** ** Data from: 12/28/16 [facility]  He has seven grandchildren.  ** Data from: 03/23/17 Enc Dept: [facility]   Lives with wife   Self-employed truck driver   Social Determinants of Health   Financial Resource Strain: Low Risk    Difficulty of Paying Living Expenses: Not hard at all  Food Insecurity: No Food Insecurity   Worried About Programme researcher, broadcasting/film/video in the Last Year: Never true   Barista in the Last Year: Never true  Transportation Needs: No Transportation Needs   Lack of Transportation (Medical): No   Lack of Transportation (Non-Medical): No  Physical Activity: Sufficiently Active   Days of Exercise per Week: 5 days   Minutes of Exercise per Session: 30 min  Stress: No Stress Concern Present   Feeling of Stress : Not at all  Social Connections: Socially Integrated   Frequency of Communication with Friends and Family: More than three times a week   Frequency of Social Gatherings with Friends and Family: More than three times a week   Attends Religious Services: More than 4 times per year   Active Member of [employer] or Organizations: Yes   Attends Hospital doctor: More than 4 times per year   Marital Status: Married    Tobacco Counseling Ready to quit: Not Answered Counseling given: Not Answered   Clinical Intake:  Pre-visit preparation completed: Yes  Pain : No/denies pain     BMI - recorded: 41.93 Nutritional Status: BMI > 30  Obese Nutritional Risks: None Diabetes: Yes CBG done?: No Did pt. bring in CBG monitor from home?: No  How often do you need to have someone help you when you read instructions, pamphlets, or other written materials from your doctor or pharmacy?: 1 - Never  Diabetic? Nutrition Risk Assessment:  Has the patient had any N/V/D within the last 2 months?  No  Does the patient have any non-healing wounds?  No  Has the patient had any unintentional weight loss or weight gain?  No   Diabetes:  Is the patient diabetic?  Yes  If diabetic, was a CBG obtained today?  No  Did the patient bring in their glucometer from home?  No  How often do you monitor your CBG's? never.   Financial Strains and Diabetes Management:  Are you having any financial strains with the device, your supplies or your medication? No .  Does the patient want to be seen by Chronic Care Management for management of their diabetes?  No  Would the patient like to be referred to a Nutritionist or for Diabetic Management?  No   Diabetic Exams:  Diabetic Eye Exam: Completed 08/2021   Diabetic Foot Exam: Completed 03/17/2020. Pt has been advised about the importance in completing this exam. Pt is scheduled for diabetic foot exam on next visit.    Interpreter Needed?: No  Information entered by :: Leticia Coletta, LPN   Activities of Daily Living In your present state of health, do you have any difficulty performing the following activities: 10/01/2021  Hall? N  Vision? N  Difficulty concentrating or making decisions? N  Walking or climbing stairs? N  Dressing or bathing? N  Doing errands, shopping? N  Preparing Food and eating ? N   Using the Toilet? N  In the past six months, have you accidently leaked urine? N  Do you have problems with loss of bowel control? N  Managing your Medications? N  Managing your Finances? N  Housekeeping or managing your Housekeeping? N  Some recent data might be hidden    Patient Care Team: Raliegh IpGottschalk, Ashly M, DO as PCP - General (Family Medicine) Rollene RotundaHochrein, James, MD as Consulting Physician (Cardiology) Michaelle CopasLe, Yen Thi Hong, MD as Referring Physician (Optometry)  Indicate any recent Medical Services you may have received from other than [facility] providers in the past year (date may be approximate).     Assessment:   This is a routine wellness examination for Lane.  Hall/Vision screen Hall Screening - Comments:: Denies Hall difficulties   Vision Screening - Comments:: No vision concerns - up to date [place] [facility] [place] [place]  Dietary issues and exercise activities discussed: Current Exercise Habits: Home exercise routine, Type of exercise: walking;Other - see comments (chopping wood, yard work), Time (Minutes): 30, Frequency (Times/Week): 5, Weekly Exercise (Minutes/Week): 150, Intensity: Moderate, Exercise limited by: cardiac condition(s)   Goals Addressed             This Visit's Progress    Weight (lb) < 200 lb (90.7 kg)   252 lb (114.3 kg)      Depression Screen PHQ 2/9 Scores 10/01/2021 07/27/2021 11/06/2020 03/17/2020 08/13/2019 05/11/2019  PHQ - 2 Score 0 0 0 0 0 0  PHQ- 9 Score - - 0 - 0 -  Some encounter information is confidential and restricted. Go to Review Flowsheets activity to see all data.    Fall Risk Fall Risk  10/01/2021 07/27/2021 03/17/2020 05/11/2019  Falls in the past year? 0 0 0 0  Number falls in past yr: 0 - - -  Injury with Fall? 0 - - -  Risk for fall due to : No Fall Risks - - -  Follow up Falls prevention discussed - - -  Some encounter information is confidential and restricted. Go to Review Flowsheets activity to see all  data.    FALL RISK PREVENTION PERTAINING TO THE HOME:  Any stairs in or around the home? Yes  If so, are  there any without handrails? No  Home free of loose throw rugs in walkways, pet beds, electrical cords, etc? Yes  Adequate lighting in your home to reduce risk of falls? Yes   ASSISTIVE DEVICES UTILIZED TO PREVENT FALLS:  Life alert? No  Use of a cane, walker or w/c? No  Grab bars in the bathroom? No  Shower chair or bench in shower? No  Elevated toilet seat or a handicapped toilet? No   TIMED UP AND GO:  Was the test performed? No . Telephonic visit  Cognitive Function:     6CIT Screen 10/01/2021  What Year? 0 points  What month? 0 points  What time? 0 points  Count back from 20 0 points  Months in reverse 0 points  Repeat phrase 0 points  Total Score 0    Immunizations Immunization History  Administered Date(s) Administered   Pneumococcal Conjugate-13 11/06/2020    TDAP status: Due, Education has been provided regarding the importance of this vaccine. Advised may receive this vaccine at local pharmacy or Health Dept. Aware to provide a copy of the vaccination record if obtained from local pharmacy or Health Dept. Verbalized acceptance and understanding.  Flu Vaccine status: Declined, Education has been provided regarding the importance of this vaccine but patient still declined. Advised may receive this vaccine at local pharmacy or Health Dept. Aware to provide a copy of the vaccination record if obtained from local pharmacy or Health Dept. Verbalized acceptance and understanding.  Pneumococcal vaccine status: Up to date  Covid-19 vaccine status: Declined, Education has been provided regarding the importance of this vaccine but patient still declined. Advised may receive this vaccine at local pharmacy or Health Dept.or vaccine clinic. Aware to provide a copy of the vaccination record if obtained from local pharmacy or Health Dept. Verbalized acceptance and  understanding.  Qualifies for Shingles Vaccine? Yes   Zostavax completed No   Shingrix Completed?: No.    Education has been provided regarding the importance of this vaccine. Patient has been advised to call insurance company to determine out of pocket expense if they have not yet received this vaccine. Advised may also receive vaccine at local pharmacy or Health Dept. Verbalized acceptance and understanding.  Screening Tests Health Maintenance  Topic Date Due   URINE MICROALBUMIN  05/10/2020   Pneumonia Vaccine 5065+ Years old (2 - PPSV23 if available, else PCV20) 11/06/2021   COVID-19 Vaccine (1) 10/17/2021 (Originally 01/09/1957)   Zoster Vaccines- Shingrix (1 of 2) 10/25/2021 (Originally 07/11/2005)   TETANUS/TDAP  11/06/2021 (Originally 07[birth year]11/1974)   INFLUENZA VACCINE  11/23/2021 (Originally 03/26/2021)   COLONOSCOPY (Pts [id number] Insurance coverage will need to be confirmed)  07/27/2022 (Originally 07/11/2000)   FOOT EXAM  08/29/2022 (Originally 03/17/2021)   HEMOGLOBIN A1C  01/25/2022   OPHTHALMOLOGY EXAM  08/26/2022   Hepatitis C Screening  Completed   HPV VACCINES  Aged Out    Health Maintenance  Health Maintenance Due  Topic Date Due   URINE MICROALBUMIN  05/10/2020   Pneumonia Vaccine 1665+ Years old (2 - PPSV23 if available, else PCV20) 11/06/2021    Declined colonoscopy  Lung Cancer Screening: (Low Dose CT Chest recommended if Age 58-80 years, 30 pack-year currently smoking OR have quit w/in 15years.) does qualify.   Lung Cancer Screening Referral: done 07/2021   Additional Screening:  Hepatitis C Screening: does qualify; Completed 05/11/2019  Vision Screening: Recommended annual ophthalmology exams for early detection of glaucoma and other disorders of the eye. Is the patient up to  date with their annual eye exam?  Yes  Who is the provider or what is the name of the office in which the patient attends annual eye exams? [facility] Arias in [place] If pt is not established with  a provider, would they like to be referred to a provider to establish care? No .   Dental Screening: Recommended annual dental exams for proper oral hygiene  Community Resource Referral / Chronic Care Management: CRR required this visit?  No   CCM required this visit?  No      Plan:     I have personally reviewed and noted the following in the patients chart:   Medical and social history Use of alcohol, tobacco or illicit drugs  Current medications and supplements including opioid prescriptions. Patient is not currently taking opioid prescriptions. Functional ability and status Nutritional status Physical activity Advanced directives List of other physicians Hospitalizations, surgeries, and ER visits in previous 12 months Vitals Screenings to include cognitive, depression, and falls Referrals and appointments  In addition, I have reviewed and discussed with patient certain preventive protocols, quality metrics, and best practice recommendations. A written personalized care plan for preventive services as well as general preventive health recommendations were provided to patient.     [place] Constable, LPN   08/31/1094   Nurse Notes: Past due for Diabetic foot exam.

## 2021-10-03 ENCOUNTER — Other Ambulatory Visit: Payer: Self-pay

## 2021-10-03 DIAGNOSIS — F17210 Nicotine dependence, cigarettes, uncomplicated: Secondary | ICD-10-CM

## 2021-10-03 DIAGNOSIS — Z87891 Personal history of nicotine dependence: Secondary | ICD-10-CM

## 2021-10-16 ENCOUNTER — Encounter: Payer: Self-pay | Admitting: Acute Care

## 2021-10-16 ENCOUNTER — Ambulatory Visit (INDEPENDENT_AMBULATORY_CARE_PROVIDER_SITE_OTHER): Payer: Medicare HMO | Admitting: Acute Care

## 2021-10-16 ENCOUNTER — Other Ambulatory Visit: Payer: Self-pay

## 2021-10-16 DIAGNOSIS — F17210 Nicotine dependence, cigarettes, uncomplicated: Secondary | ICD-10-CM | POA: Diagnosis not present

## 2021-10-16 NOTE — Patient Instructions (Signed)
Thank you for participating in the [facility]. °It was our pleasure to meet you today. °We will call you with the results of your scan within the next few days. °Your scan will be assigned a Lung RADS category score by the physicians reading the scans.  °This Lung RADS score determines follow up scanning.  °See below for description of categories, and follow up screening recommendations. °We will be in touch to schedule your follow up screening annually or based on recommendations of our providers. °We will fax a copy of your scan results to your Primary Care Physician, or the physician who referred you to the program, to ensure they have the results. °Please call the office if you have any questions or concerns regarding your scanning experience or results.  °Our office number is [phone]. °Please speak with Denise Phelps, RN. She is our Lung Cancer Screening RN. °If she is unavailable when you call, please have the office staff send her a message. She will return your call at her earliest convenience. °Remember, if your scan is normal, we will scan you annually as long as you continue to meet the criteria for the program. (Age 55-77, Current smoker or smoker who has quit within the last 15 years). °If you are a smoker, remember, quitting is the single most powerful action that you can take to decrease your risk of lung cancer and other pulmonary, breathing related problems. °We know quitting is hard, and we are here to help.  °Please let us know if there is anything we can do to help you meet your goal of quitting. °If you are a former smoker, congratulations. We are proud of you! Remain smoke free! °Remember you can refer friends or family members through the number above.  °We will screen them to make sure they meet criteria for the program. °Thank you for helping us take better care of you by participating in Lung Screening. ° °You can receive free nicotine replacement therapy  ( patches, gum or mints) by calling 1-800-QUIT NOW. Please call so we can get you on the path to becoming  a non-smoker. I know it is hard, but you can do this! ° °Lung RADS Categories: ° °Lung RADS 1: no nodules or definitely non-concerning nodules.  °Recommendation is for a repeat annual scan in 12 months. ° °Lung RADS 2:  nodules that are non-concerning in appearance and behavior with a very low likelihood of becoming an active cancer. °Recommendation is for a repeat annual scan in 12 months. ° °Lung RADS 3: nodules that are probably non-concerning , includes nodules with a low likelihood of becoming an active cancer.  Recommendation is for a 6-month repeat screening scan. Often noted after an upper respiratory illness. We will be in touch to make sure you have no questions, and to schedule your 6-month scan. ° °Lung RADS 4 A: nodules with concerning findings, recommendation is most often for a follow up scan in 3 months or additional testing based on our provider's assessment of the scan. We will be in touch to make sure you have no questions and to schedule the recommended 3 month follow up scan. ° °Lung RADS 4 B:  indicates findings that are concerning. We will be in touch with you to schedule additional diagnostic testing based on our provider's  assessment of the scan. ° °Hypnosis for smoking cessation  °[employer] °[phone] ° °Acupuncture for smoking cessation  °[facility] °[phone]  °

## 2021-10-16 NOTE — Progress Notes (Signed)
Virtual Visit via Telephone Note  I connected with Randall Hall on 10/16/21 at  4:00 PM EST by telephone and verified that I am speaking with the correct person using two identifiers.  Location: Patient:  At home Provider: [address] [place], [place], [address]    I discussed the limitations, risks, security and privacy concerns of performing an evaluation and management service by telephone and the availability of in person appointments. I also discussed with the patient that there may be a patient responsible charge related to this service. The patient expressed understanding and agreed to proceed.   Shared Decision Making Visit Lung Cancer Screening Program [phone])   Eligibility: Age 66 y.o. Pack Years Smoking History Calculation 39 pack year smoking history (# packs/per year x # years smoked) Recent History of coughing up blood  no Unexplained weight loss? no ( >Than 15 pounds within the last 6 months ) Prior History Lung / other cancer no (Diagnosis within the last 5 years already requiring surveillance chest CT Scans). Smoking Status Current Smoker Former Smokers: Years since quit:  NA  Quit Date:  NA  Visit Components: Discussion included one or more decision making aids. yes Discussion included risk/benefits of screening. yes Discussion included potential follow up diagnostic testing for abnormal scans. yes Discussion included meaning and risk of over diagnosis. yes Discussion included meaning and risk of False Positives. yes Discussion included meaning of total radiation exposure. yes  Counseling Included: Importance of adherence to annual lung cancer LDCT screening. yes Impact of comorbidities on ability to participate in the program. yes Ability and willingness to under diagnostic treatment. yes  Smoking Cessation Counseling: Current Smokers:  Discussed importance of smoking cessation. yes Information about tobacco cessation classes and interventions  provided to patient. yes Patient provided with "ticket" for LDCT Scan. yes Symptomatic Patient. no  Counseling NA Diagnosis Code: Tobacco Use Z72.0 Asymptomatic Patient yes  Counseling (Intermediate counseling: > three minutes counseling) [id number] Former Smokers:  Discussed the importance of maintaining cigarette abstinence. yes Diagnosis Code: Personal History of Nicotine Dependence. N05.397 Information about tobacco cessation classes and interventions provided to patient. Yes Patient provided with "ticket" for LDCT Scan. yes Written Order for Lung Cancer Screening with LDCT placed in Epic. Yes (CT Chest Lung Cancer Screening Low Dose W/O CM) [id number] Z12.2-Screening of respiratory organs Z87.891-Personal history of nicotine dependence  I have spent 25 minutes of face to face/ virtual visit   time with  Randall Hall discussing the risks and benefits of lung cancer screening. We viewed / discussed a power point together that explained in detail the above noted topics. We paused at intervals to allow for questions to be asked and answered to ensure understanding.We discussed that the single most powerful action that he can take to decrease his risk of developing lung cancer is to quit smoking. We discussed whether or not he is ready to commit to setting a quit date. We discussed options for tools to aid in quitting smoking including nicotine replacement therapy, non-nicotine medications, support groups, Quit Smart classes, and behavior modification. We discussed that often times setting smaller, more achievable goals, such as eliminating 1 cigarette a day for a week and then 2 cigarettes a day for a week can be helpful in slowly decreasing the number of cigarettes smoked. This allows for a sense of accomplishment as well as providing a clinical benefit. I provided  him  with smoking cessation  information  with contact information for community resources, classes, free  nicotine replacement therapy, and  access to mobile apps, text messaging, and on-line smoking cessation help. I have also provided  him  the office contact information in the event he needs to contact me, or the screening staff. We discussed the time and location of the scan, and that either Abigail Miyamoto RN, Karlton Lemon, RN  or I will call / send a letter with the results within 24-72 hours of receiving them. The patient verbalized understanding of all of  the above and had no further questions upon leaving the office. They have my contact information in the event they have any further questions.  I spent 3 minutes counseling on smoking cessation and the health risks of continued tobacco abuse.  I explained to the patient that there has been a high incidence of coronary artery disease noted on these exams. I explained that this is a non-gated exam therefore degree or severity cannot be determined. This patient is on statin therapy. I have asked the patient to follow-up with their PCP regarding any incidental finding of coronary artery disease and management with diet or medication as their PCP  feels is clinically indicated. The patient verbalized understanding of the above and had no further questions upon completion of the visit.      Bevelyn Ngo, NP 10/16/2021

## 2021-10-17 ENCOUNTER — Ambulatory Visit (INDEPENDENT_AMBULATORY_CARE_PROVIDER_SITE_OTHER): Payer: Medicare HMO

## 2021-10-17 ENCOUNTER — Other Ambulatory Visit: Payer: Self-pay

## 2021-10-17 DIAGNOSIS — Z87891 Personal history of nicotine dependence: Secondary | ICD-10-CM | POA: Diagnosis not present

## 2021-10-17 DIAGNOSIS — F17210 Nicotine dependence, cigarettes, uncomplicated: Secondary | ICD-10-CM

## 2021-10-17 DIAGNOSIS — J439 Emphysema, unspecified: Secondary | ICD-10-CM

## 2021-10-17 DIAGNOSIS — I700 Atherosclerosis of aorta: Secondary | ICD-10-CM

## 2021-10-22 ENCOUNTER — Other Ambulatory Visit: Payer: Self-pay

## 2021-10-22 ENCOUNTER — Encounter: Payer: Self-pay | Admitting: Family Medicine

## 2021-10-22 DIAGNOSIS — J432 Centrilobular emphysema: Secondary | ICD-10-CM | POA: Insufficient documentation

## 2021-10-22 DIAGNOSIS — Z87891 Personal history of nicotine dependence: Secondary | ICD-10-CM

## 2021-10-22 DIAGNOSIS — F17210 Nicotine dependence, cigarettes, uncomplicated: Secondary | ICD-10-CM

## 2021-10-22 DIAGNOSIS — I700 Atherosclerosis of aorta: Secondary | ICD-10-CM | POA: Insufficient documentation

## 2021-11-26 ENCOUNTER — Ambulatory Visit: Payer: Medicare HMO | Admitting: Family Medicine

## 2021-12-20 ENCOUNTER — Ambulatory Visit (INDEPENDENT_AMBULATORY_CARE_PROVIDER_SITE_OTHER): Payer: Medicare HMO | Admitting: Family Medicine

## 2021-12-20 ENCOUNTER — Encounter: Payer: Self-pay | Admitting: Family Medicine

## 2021-12-20 VITALS — BP 127/76 | HR 61 | Temp 98.0°F | Ht 65.0 in | Wt 252.0 lb

## 2021-12-20 DIAGNOSIS — I25700 Atherosclerosis of coronary artery bypass graft(s), unspecified, with unstable angina pectoris: Secondary | ICD-10-CM | POA: Diagnosis not present

## 2021-12-20 DIAGNOSIS — E785 Hyperlipidemia, unspecified: Secondary | ICD-10-CM

## 2021-12-20 DIAGNOSIS — E1169 Type 2 diabetes mellitus with other specified complication: Secondary | ICD-10-CM | POA: Diagnosis not present

## 2021-12-20 MED ORDER — EMPAGLIFLOZIN 10 MG PO TABS: 10.0000 mg | ORAL_TABLET | Freq: Every day | ORAL | 1 refills | Status: DC

## 2021-12-20 NOTE — Patient Instructions (Signed)
Continue Metformin. ?Take Jardiance with the Metformin. ?If you develop pain with urination, dehydration, genital pain/ discoloration, STOP Jardiance immediately ? ?Otherwise, CUT BACK ON SUGAR AND CARBS! ? ?Carbohydrate Counting for Diabetes Mellitus, Adult ?Carbohydrate counting is a method of keeping track of how many carbohydrates you eat. Eating carbohydrates increases the amount of sugar (glucose) in the blood. Counting how many carbohydrates you eat improves how well you manage your blood glucose. This, in turn, helps you manage your diabetes. ?Carbohydrates are measured in grams (g) per serving. It is important to know how many carbohydrates (in grams or by serving size) you can have in each meal. This is different for every person. A dietitian can help you make a meal plan and calculate how many carbohydrates you should have at each meal and snack. ?What foods contain carbohydrates? ?Carbohydrates are found in the following foods: ?Grains, such as breads and cereals. ?Dried beans and soy products. ?Starchy vegetables, such as potatoes, peas, and corn. ?Fruit and fruit juices. ?Milk and yogurt. ?Sweets and snack foods, such as cake, cookies, candy, chips, and soft drinks. ?How do I count carbohydrates in foods? ?There are two ways to count carbohydrates in food. You can read food labels or learn standard serving sizes of foods. You can use either of these methods or a combination of both. ?Using the Nutrition Facts label ?The Nutrition Facts list is included on the labels of almost all packaged foods and beverages in the [place]. It includes: ?The serving size. ?Information about nutrients in each serving, including the grams of carbohydrate per serving. ?To use the Nutrition Facts, decide how many servings you will have. Then, multiply the number of servings by the number of carbohydrates per serving. The resulting number is the total grams of carbohydrates that you will be having. ?Learning the  standard serving sizes of foods ?When you eat carbohydrate foods that are not packaged or do not include Nutrition Facts on the label, you need to measure the servings in order to count the grams of carbohydrates. ?Measure the foods that you will eat with a food scale or measuring cup, if needed. ?Decide how many standard-size servings you will eat. ?Multiply the number of servings by 15. For foods that contain carbohydrates, one serving equals 15 g of carbohydrates. ?For example, if you eat 2 cups or 10 oz (300 g) of strawberries, you will have eaten 2 servings and 30 g of carbohydrates (2 servings x 15 g = 30 g). ?For foods that have more than one food mixed, such as soups and casseroles, you must count the carbohydrates in each food that is included. ?The following list contains standard serving sizes of common carbohydrate-rich foods. Each of these servings has about 15 g of carbohydrates: ?1 slice of bread. ?1 six-inch (15 cm) tortilla. ?? cup or 2 oz (53 g) cooked rice or pasta. ?? cup or 3 oz (85 g) cooked or canned, drained and rinsed beans or lentils. ?? cup or 3 oz (85 g) starchy vegetable, such as peas, corn, or squash. ?? cup or 4 oz (120 g) hot cereal. ?? cup or 3 oz (85 g) boiled or mashed potatoes, or ? or 3 oz (85 g) of a large baked potato. ?? cup or 4 fl oz (118 mL) fruit juice. ?1 cup or 8 fl oz (237 mL) milk. ?1 small or 4 oz (106 g) apple. ?? or 2 oz (63 g) of a medium banana. ?1 cup or 5 oz (150 g) strawberries. ?  3 cups or 1 oz (28.3 g) popped popcorn. ?What is an example of carbohydrate counting? ?To calculate the grams of carbohydrates in this sample meal, follow the steps shown below. ?Sample meal ?3 oz (85 g) chicken breast. ?? cup or 4 oz (106 g) brown rice. ?? cup or 3 oz (85 g) corn. ?1 cup or 8 fl oz (237 mL) milk. ?1 cup or 5 oz (150 g) strawberries with sugar-free whipped topping. ?Carbohydrate calculation ?Identify the foods that contain  carbohydrates: ?Rice. ?Corn. ?Milk. ?Strawberries. ?Calculate how many servings you have of each food: ?2 servings rice. ?1 serving corn. ?1 serving milk. ?1 serving strawberries. ?Multiply each number of servings by 15 g: ?2 servings rice x 15 g = 30 g. ?1 serving corn x 15 g = 15 g. ?1 serving milk x 15 g = 15 g. ?1 serving strawberries x 15 g = 15 g. ?Add together all of the amounts to find the total grams of carbohydrates eaten: ?30 g + 15 g + 15 g + 15 g = 75 g of carbohydrates total. ?What are tips for following this plan? ?Shopping ?Develop a meal plan and then make a shopping list. ?Buy fresh and frozen vegetables, fresh and frozen fruit, dairy, eggs, beans, lentils, and whole grains. ?Look at food labels. Choose foods that have more fiber and less sugar. ?Avoid processed foods and foods with added sugars. ?Meal planning ?Aim to have the same number of grams of carbohydrates at each meal and for each snack time. ?Plan to have regular, balanced meals and snacks. ?Where to find more information ?American Diabetes Association: diabetes.org ?Centers for Disease Control and Prevention: TonerPromos.no ?Academy of Nutrition and Dietetics: eatright.org ?Association of Diabetes Care & Education Specialists: diabeteseducator.org ?Summary ?Carbohydrate counting is a method of keeping track of how many carbohydrates you eat. ?Eating carbohydrates increases the amount of sugar (glucose) in your blood. ?Counting how many carbohydrates you eat improves how well you manage your blood glucose. This helps you manage your diabetes. ?A dietitian can help you make a meal plan and calculate how many carbohydrates you should have at each meal and snack. ?This information is not intended to replace advice given to you by your health care provider. Make sure you discuss any questions you have with your health care provider. ?Document Revised: 03/15/2020 Document Reviewed: 03/15/2020 ?Elsevier Patient Education ? 2023 Elsevier Inc. ? ?

## 2021-12-20 NOTE — Progress Notes (Signed)
? ?Subjective: ?CC:Dm ?PCP: Janora Norlander, DO ?WPY:Randall Hall is a 66 y.o. male presenting to clinic today for: ? ?1. Type 2 Diabetes with hypertension, hyperlipidemia:  ?Patient reports compliance with 1000 mg of metformin extended release daily.  He is compliant with Lipitor 40 mg daily.  He does not check blood sugars regularly ? ?Last eye exam: UTD ?Last foot exam: UTD ?Last A1c:  ?Lab Results  ?Component Value Date  ? HGBA1C 7.1 (H) 07/27/2021  ? ?Nephropathy screen indicated?: needs ?Last flu, zoster and/or pneumovax:  ?Immunization History  ?Administered Date(s) Administered  ? Pneumococcal Conjugate-13 11/06/2020  ? ? ?ROS: Denies dizziness, LOC, polyuria, polydipsia, unintended weight loss/gain, foot ulcerations, numbness or tingling in extremities, shortness of breath or chest pain. ? ?ROS: Per HPI ? ?Allergies  ?Allergen Reactions  ? Sulfa Antibiotics Anaphylaxis  ?  hives swelling  ? ?Past Medical History:  ?Diagnosis Date  ? AKI (acute kidney injury) ([place]) 07/14/2016  ? CAD (coronary artery disease)   ? History of myocardial infarction   ? Hx of CABG   ? Hyperlipemia   ? MI (myocardial infarction) ([place])   ? Tobacco abuse   ? ? ?Current Outpatient Medications:  ?  aspirin EC 81 MG tablet, Take 1 tablet (81 mg total) by mouth daily., Disp: 90 tablet, Rfl: 3 ?  atorvastatin (LIPITOR) 40 MG tablet, TAKE 1 TABLET EVERY DAY  AT  6PM, Disp: 90 tablet, Rfl: 3 ?  azelastine (ASTELIN) 0.1 % nasal spray, Place 1 spray into both nostrils 2 (two) times daily., Disp: 30 mL, Rfl: 12 ?  BLACK CURRANT SEED OIL PO, Take 1 Dose by mouth daily., Disp: , Rfl:  ?  GARLIC PO, Take 1 tablet by mouth daily., Disp: , Rfl:  ?  metFORMIN (GLUCOPHAGE XR) 500 MG 24 hr tablet, Take 1 tablet (500 mg total) by mouth daily with breakfast., Disp: 90 tablet, Rfl: 3 ?  Omega-3 Fatty Acids (FISH OIL PO), Take 800 mg by mouth daily., Disp: , Rfl:  ?  Tadalafil 2.5 MG TABS, Take 1 tablet (2.5 mg total) by mouth daily., Disp: 90  tablet, Rfl: 3 ?  TURMERIC PO, Take 1,000 mg by mouth daily., Disp: , Rfl:  ?  VITAMIN E PO, Take 2 capsules by mouth daily., Disp: , Rfl:  ?Social History  ? ?Socioeconomic History  ? Marital status: Married  ?  Spouse name: Not on file  ? Number of children: 4  ? Years of education: Not on file  ? Highest education level: Not on file  ?Occupational History  ? Occupation: TRUCK DRIVER  ?  Employer: [employer]  ?Tobacco Use  ? Smoking status: Every Day  ?  Packs/day: 1.00  ?  Years: 21.00  ?  Pack years: 21.00  ?  Types: Cigarettes  ? Smokeless tobacco: Never  ?Vaping Use  ? Vaping Use: Never used  ?Substance and Sexual Activity  ? Alcohol use: Not Currently  ? Drug use: Not Currently  ? Sexual activity: Not on file  ?Other Topics Concern  ? Not on file  ?Social History Narrative  ? ** Merged History Encounter ** ** Data from: 12/28/16 [facility].  He has seven grandchildren.  ** Data from: 03/23/17 Enc Dept: [facility]  ? Lives with wife  ? Self-employed truck driver  ? ?Social Determinants of Health  ? ?Financial Resource Strain: Low Risk   ? Difficulty of Paying Living Expenses: Not hard at all  ?  Food Insecurity: No Food Insecurity  ? Worried About Charity fundraiser in the Last Year: Never true  ? Ran Out of Food in the Last Year: Never true  ?Transportation Needs: No Transportation Needs  ? Lack of Transportation (Medical): No  ? Lack of Transportation (Non-Medical): No  ?Physical Activity: Sufficiently Active  ? Days of Exercise per Week: 5 days  ? Minutes of Exercise per Session: 30 min  ?Stress: No Stress Concern Present  ? Feeling of Stress : Not at all  ?Social Connections: Socially Integrated  ? Frequency of Communication with Friends and Family: More than three times a week  ? Frequency of Social Gatherings with Friends and Family: More than three times a week  ? Attends Religious Services: More than 4 times per year  ? Active Member of Clubs or Organizations:  Yes  ? Attends Archivist Meetings: More than 4 times per year  ? Marital Status: Married  ?Intimate Partner Violence: Not At Risk  ? Fear of Current or Ex-Partner: No  ? Emotionally Abused: No  ? Physically Abused: No  ? Sexually Abused: No  ? ?Family History  ?Problem Relation Age of Onset  ? Diabetes Mother   ? CAD Father 37  ?     Died  ? CAD Brother   ? ? ?Objective: ?Office vital signs reviewed. ?BP 127/76   Pulse 61   Temp 98 ?F (36.7 ?C)   Ht '5\' 5"'  (1.651 m)   Wt 252 lb (114.3 kg)   SpO2 97%   BMI 41.93 kg/m?  ? ?Physical Examination:  ?General: Awake, alert, well nourished, No acute distress ?HEENT: Sclera white.  Moist mucous membranes ?Cardio: regular rate and rhythm, S1S2 heard, no murmurs appreciated ?Pulm: clear to auscultation bilaterally, no wheezes, rhonchi or rales; normal work of breathing on room air ?Extremities: warm, well perfused, No edema, cyanosis or clubbing; +2 pulses ?Neuro: See diabetic foot exam ?Diabetic Foot Exam - Simple   ?Simple Foot Form ?Diabetic Foot exam was performed with the following findings: Yes 12/24/2021  4:32 PM  ?Visual Inspection ?See comments: Yes ?Sensation Testing ?Intact to touch and monofilament testing bilaterally: Yes ?Pulse Check ?Posterior Tibialis and Dorsalis pulse intact bilaterally: Yes ?Comments ?Onychomycotic changes noted through the nails bilaterally ?  ? ?Lab Results  ?Component Value Date  ? HGBA1C 7.3 (H) 12/20/2021  ? ? ?Assessment/ Plan: ?66 y.o. male  ? ?Type 2 diabetes mellitus with other specified complication, without long-term current use of insulin (HCC) - Plan: Bayer DCA Hb A1c Waived, CMP14+EGFR, Microalbumin / creatinine urine ratio, empagliflozin (JARDIANCE) 10 MG TABS tablet ? ?Hyperlipidemia associated with type 2 diabetes mellitus ([place]) ? ?Coronary artery disease involving coronary bypass graft of native heart with unstable angina pectoris ([place]) - Plan: empagliflozin (JARDIANCE) 10 MG TABS tablet ? ?Morbid obesity  ([place]) ? ?Unfortunately sugar is rising.  A1c up to 7.3 today. Vania Rea has been added given uncontrolled BGs, h/o heart disease.  Discussed carb restriction and red flags warranting discontinuation of jardiance.  Follow up in 3 months ? ?Continue statin ? ?Discussed option of GLP but did not want to pursue that just yet. ? ?Orders Placed This Encounter  ?Procedures  ? Bayer DCA Hb A1c Waived  ? CMP14+EGFR  ? Microalbumin / creatinine urine ratio  ? ?No orders of the defined types were placed in this encounter. ? ? ? ?Janora Norlander, DO ?[facility] ?[phone] ? ? ?

## 2021-12-21 LAB — CMP14+EGFR
ALT: 21 IU/L (ref 0–44)
AST: 16 IU/L (ref 0–40)
Albumin/Globulin Ratio: 1.6 (ref 1.2–2.2)
Albumin: 4.3 g/dL (ref 3.8–4.8)
Alkaline Phosphatase: 90 IU/L (ref 44–121)
BUN/Creatinine Ratio: 11 (ref 10–24)
BUN: 13 mg/dL (ref 8–27)
Bilirubin Total: 0.2 mg/dL (ref 0.0–1.2)
CO2: 25 mmol/L (ref 20–29)
Calcium: 9.6 mg/dL (ref 8.6–10.2)
Chloride: 99 mmol/L (ref 96–106)
Creatinine, Ser: 1.2 mg/dL (ref 0.76–1.27)
Globulin, Total: 2.7 g/dL (ref 1.5–4.5)
Glucose: 109 mg/dL — ABNORMAL HIGH (ref 70–99)
Potassium: 4.4 mmol/L (ref 3.5–5.2)
Sodium: 139 mmol/L (ref 134–144)
Total Protein: 7 g/dL (ref 6.0–8.5)
eGFR: 67 mL/min/{1.73_m2} (ref >59)

## 2021-12-21 LAB — MICROALBUMIN / CREATININE URINE RATIO
Creatinine, Urine: 162.8 mg/dL
Microalb/Creat Ratio: 6 mg/g creat (ref 0–29)
Microalbumin, Urine: 9.2 ug/mL

## 2021-12-21 LAB — BAYER DCA HB A1C WAIVED: HB A1C (BAYER DCA - WAIVED): 7.3 % — ABNORMAL HIGH (ref 4.8–5.6)

## 2022-03-22 ENCOUNTER — Encounter: Payer: Self-pay | Admitting: Family Medicine

## 2022-03-22 ENCOUNTER — Ambulatory Visit (INDEPENDENT_AMBULATORY_CARE_PROVIDER_SITE_OTHER): Payer: Medicare HMO | Admitting: Family Medicine

## 2022-03-22 VITALS — BP 134/80 | HR 69 | Temp 98.0°F | Ht 65.0 in | Wt 244.4 lb

## 2022-03-22 DIAGNOSIS — E785 Hyperlipidemia, unspecified: Secondary | ICD-10-CM | POA: Diagnosis not present

## 2022-03-22 DIAGNOSIS — I25700 Atherosclerosis of coronary artery bypass graft(s), unspecified, with unstable angina pectoris: Secondary | ICD-10-CM | POA: Diagnosis not present

## 2022-03-22 DIAGNOSIS — E1169 Type 2 diabetes mellitus with other specified complication: Secondary | ICD-10-CM | POA: Diagnosis not present

## 2022-03-22 DIAGNOSIS — E118 Type 2 diabetes mellitus with unspecified complications: Secondary | ICD-10-CM | POA: Diagnosis not present

## 2022-03-22 DIAGNOSIS — I700 Atherosclerosis of aorta: Secondary | ICD-10-CM

## 2022-03-22 LAB — BAYER DCA HB A1C WAIVED: HB A1C (BAYER DCA - WAIVED): 6.7 % — ABNORMAL HIGH (ref 4.8–5.6)

## 2022-03-22 MED ORDER — EMPAGLIFLOZIN 10 MG PO TABS: 10.0000 mg | ORAL_TABLET | Freq: Every day | ORAL | 3 refills | Status: DC

## 2022-03-22 NOTE — Progress Notes (Signed)
Subjective: CC:DM PCP: Raliegh Ip, DO ZJI:RCVE Randall Hall is a 66 y.o. male presenting to clinic today for:  1. Type 2 Diabetes with hyperlipidemia, CAD, Aortic atherosclerosis:  Patient reports that over the last month he is really been restricting amount of carbs and sugar.  He has been increasing physical activity.  He is cut his tobacco use to only half pack per day.  He discontinued the Jardiance after he completed the sample because he was having quite a bit of urination at nighttime.  He was not aware that this was something he states taking in the morning.  He continues to take metformin XR 500 mg daily.  He started taking some type of supplements and this seems to be improving his energy level and ability to exercise.  Last eye exam: UTD Last foot exam: UTD Last A1c:  Lab Results  Component Value Date   HGBA1C 7.3 (H) 12/20/2021   Nephropathy screen indicated?: UTD Last flu, zoster and/or pneumovax: needs PNA, Shingles Immunization History  Administered Date(s) Administered   Pneumococcal Conjugate-13 11/06/2020    ROS: Per HPI  Allergies  Allergen Reactions   Sulfa Antibiotics Anaphylaxis    hives swelling   Past Medical History:  Diagnosis Date   AKI (acute kidney injury) (HCC) 07/14/2016   CAD (coronary artery disease)    History of myocardial infarction    Hx of CABG    Hyperlipemia    MI (myocardial infarction) (HCC)    Tobacco abuse    Traumatic closed fracture of distal clavicle with minimal displacement, right, initial encounter 03/22/2017    Current Outpatient Medications:    aspirin EC 81 MG tablet, Take 1 tablet (81 mg total) by mouth daily., Disp: 90 tablet, Rfl: 3   atorvastatin (LIPITOR) 40 MG tablet, TAKE 1 TABLET EVERY DAY  AT  6PM, Disp: 90 tablet, Rfl: 3   azelastine (ASTELIN) 0.1 % nasal spray, Place 1 spray into both nostrils 2 (two) times daily., Disp: 30 mL, Rfl: 12   BLACK CURRANT SEED OIL PO, Take 1 Dose by mouth daily., Disp: ,  Rfl:    empagliflozin (JARDIANCE) 10 MG TABS tablet, Take 1 tablet (10 mg total) by mouth daily before breakfast., Disp: 30 tablet, Rfl: 1   GARLIC PO, Take 1 tablet by mouth daily., Disp: , Rfl:    metFORMIN (GLUCOPHAGE XR) 500 MG 24 hr tablet, Take 1 tablet (500 mg total) by mouth daily with breakfast., Disp: 90 tablet, Rfl: 3   Omega-3 Fatty Acids (FISH OIL PO), Take 800 mg by mouth daily., Disp: , Rfl:    Tadalafil 2.5 MG TABS, Take 1 tablet (2.5 mg total) by mouth daily., Disp: 90 tablet, Rfl: 3   TURMERIC PO, Take 1,000 mg by mouth daily., Disp: , Rfl:    VITAMIN E PO, Take 2 capsules by mouth daily., Disp: , Rfl:  Social History   Socioeconomic History   Marital status: Married    Spouse name: Not on file   Number of children: 4   Years of education: Not on file   Highest education level: Not on file  Occupational History   Occupation: TRUCK DRIVER    Employer: [employer]  Tobacco Use   Smoking status: Every Day    Packs/day: 1.00    Years: 21.00    Total pack years: 21.00    Types: Cigarettes   Smokeless tobacco: Never  Vaping Use   Vaping Use: Never used  Substance and Sexual Activity  Alcohol use: Not Currently   Drug use: Not Currently   Sexual activity: Not on file  Other Topics Concern   Not on file  Social History Narrative   ** Merged History Encounter ** ** Data from: 12/28/16 [facility]ried.  He has seven grandchildren.  ** Data from: 03/23/17 Enc Dept: [facility]   Lives with wife   Self-employed truck driver   Social Determinants of Health   Financial Resource Strain: Low Risk  (10/01/2021)   Overall Financial Resource Strain (CARDIA)    Difficulty of Paying Living Expenses: Not hard at all  Food Insecurity: No Food Insecurity (10/01/2021)   Hunger Vital Sign    Worried About Running Out of Food in the Last Year: Never true    Ran Out of Food in the Last Year: Never true  Transportation Needs: No Transportation  Needs (10/01/2021)   PRAPARE - Administrator, Civil Service (Medical): No    Lack of Transportation (Non-Medical): No  Physical Activity: Sufficiently Active (10/01/2021)   Exercise Vital Sign    Days of Exercise per Week: 5 days    Minutes of Exercise per Session: 30 min  Stress: No Stress Concern Present (10/01/2021)   [employer] of Occupational Health - Occupational Stress Questionnaire    Feeling of Stress : Not at all  Social Connections: Socially Integrated (10/01/2021)   Social Connection and Isolation Panel [NHANES]    Frequency of Communication with Friends and Family: More than three times a week    Frequency of Social Gatherings with Friends and Family: More than three times a week    Attends Religious Services: More than 4 times per year    Active Member of [employer] or Organizations: Yes    Attends Engineer, structural: More than 4 times per year    Marital Status: Married  Catering manager Violence: Not At Risk (10/01/2021)   Humiliation, Afraid, Rape, and Kick questionnaire    Fear of Current or Ex-Partner: No    Emotionally Abused: No    Physically Abused: No    Sexually Abused: No   Family History  Problem Relation Age of Onset   Diabetes Mother    CAD Father 45       Died   CAD Brother     Objective: Office vital signs reviewed. BP 134/80   Pulse 69   Temp 98 F (36.7 C)   Ht 5\' 5"  (1.651 m)   Wt 244 lb 6.4 oz (110.9 kg)   SpO2 92%   BMI 40.67 kg/m   Physical Examination:  General: Awake, alert, nontoxic male, No acute distress HEENT: Harsh tobacco smell Cardio: regular rate and rhythm, S1S2 heard, no murmurs appreciated Pulm: clear to auscultation bilaterally, no wheezes, rhonchi or rales; normal work of breathing on room air  Assessment/ Plan: 66 y.o. male   Type 2 diabetes mellitus with complication, without long-term current use of insulin (HCC) - Plan: Bayer DCA Hb A1c Waived, empagliflozin (JARDIANCE) 10 MG TABS tablet, AMB  Referral to Community Care Coordinaton  Hyperlipidemia associated with type 2 diabetes mellitus (HCC)  Aortic atherosclerosis (HCC) - Plan: AMB Referral to Community Care Coordinaton  Coronary artery disease involving coronary bypass graft of native heart with unstable angina pectoris (HCC) - Plan: empagliflozin (JARDIANCE) 10 MG TABS tablet, AMB Referral to Community Care Coordinaton  Morbid obesity (HCC)  Sugar under excellent control now with A1c below 7.  I do think that the Jardiance would  still be beneficial given known heart disease.  For this reason I gave him a month sample of the 10 mg and placed referral to CCM in efforts to get this covered for him.  Anticipate we may be able to keep him on the 10 mg dose  Continue statin  Continue lifestyle modification.  Having an excellent impact on weight, blood pressure etc.  I congratulated him on his progress so far.  May follow-up in the next 3 to 6 months, sooner if concerns arise  No orders of the defined types were placed in this encounter.  No orders of the defined types were placed in this encounter.    Raliegh Ip, DO [facility] [phone]

## 2022-03-22 NOTE — Patient Instructions (Signed)
See Raynelle Fanning our pharmacist for Jardiance.  She should be able to get for free for you.   This will help with sugar, weight loss and your heart.

## 2022-03-25 ENCOUNTER — Telehealth: Payer: Self-pay

## 2022-03-25 NOTE — Chronic Care Management (AMB) (Signed)
  Chronic Care Management   Note  03/25/2022 Name: Randall Hall MRN: [id number] DOB: 11-15-1955  Randall Hall is a 66 y.o. year old male who is a primary care patient of Janora Norlander, DO. I reached out to [name] by phone today in response to a referral sent by Randall Hall PCP.  Randall Hall was given information about Chronic Care Management services today including:  CCM service includes personalized support from designated clinical staff supervised by his physician, including individualized plan of care and coordination with other care providers 24/7 contact phone numbers for assistance for urgent and routine care needs. Service will only be billed when office clinical staff spend 20 minutes or more in a month to coordinate care. Only one practitioner may furnish and bill the service in a calendar month. The patient may stop CCM services at any time (effective at the end of the month) by phone call to the office staff. The patient is responsible for co-pay (up to 20% after annual deductible is met) if co-pay is required by the individual health plan.   Patient agreed to services and verbal consent obtained.   Follow up plan: Telephone appointment with care management team member scheduled for:04/17/2022  Randall Hall, Randall Hall, Randall Hall Direct Dial: [phone] [email or web address]

## 2022-04-17 ENCOUNTER — Ambulatory Visit (INDEPENDENT_AMBULATORY_CARE_PROVIDER_SITE_OTHER): Payer: Medicare HMO | Admitting: Pharmacist

## 2022-04-17 DIAGNOSIS — E118 Type 2 diabetes mellitus with unspecified complications: Secondary | ICD-10-CM

## 2022-04-17 DIAGNOSIS — I700 Atherosclerosis of aorta: Secondary | ICD-10-CM | POA: Diagnosis not present

## 2022-04-30 NOTE — Progress Notes (Signed)
    04/17/2022 Name: Randall Hall MRN: [id number] DOB: 1955/11/16   S:   55 yoM presents for T2DM diabetes evaluation, education, and management.  Patient was referred and last seen by Primary Care Provider on 03/22/22.  Patient's A1c has improved from 7.3% --> 6.7%, however additional CV protection and glycemic control is warranted.  Patient is now on Medicare which presents coverage gap/financial challenges with brand name medications Patient reports improved lifestyle changes that include restricting his carbs/sugar intake and increasing physical activity.  We will address is T2DM medication regimen.  Insurance coverage/medication affordability: [employer] medicare  Patient reports adherence with medications. Current diabetes medications include: Jardiance samples, metformin Current hypertension medications include: ACEi (d/c'd in 2018--unsure  Goal 130/80 Current hyperlipidemia medications include: atorvastatin 49mg   LDL 84 on 07/2021   Patient denies hypoglycemic events.   Discussed meal planning options and Plate method for healthy eating Avoid sugary drinks and desserts Incorporate balanced protein, non starchy veggies, 1 serving of carbohydrate with each meal Increase water intake Increase physical activity as able   Patient-reported exercise habits: encouraged; patient reports increased exercise; appears motivated to make positive changes   O:  Lab Results  Component Value Date   HGBA1C 6.7 (H) 03/22/2022   Lipid Panel     Component Value Date/Time   CHOL 149 07/27/2021 1509   TRIG 234 (H) 07/27/2021 1509   HDL 25 (L) 07/27/2021 1509   CHOLHDL 6.0 (H) 07/27/2021 1509   CHOLHDL 7.9 07/15/2016 0136   VLDL 69 (H) 07/15/2016 0136   LDLCALC 84 07/27/2021 1509   LDLDIRECT 85 03/17/2020 1634     Home fasting blood sugars: <150 2 hour post-meal/random blood sugars: N/A.    Clinical Atherosclerotic Cardiovascular Disease (ASCVD): Yes   The ASCVD Risk score (Arnett  DK, et al., 2019) failed to calculate for the following reasons:   The patient has a prior MI or stroke diagnosis    A/P:  Diabetes T2DM--A1C 6.7% and patient is currently on metformin and Jardiance 10mg  samples. Cost is an issue with brand name medications and Medicare.  We are able to obtain 2020 via patient assistance with great ease.  Will transition patient to 10mg  by mouth every morning.  He will benefit from CV protection as well as glycemic control given CV history/CAD.     Continue Metformin (GFR 67) Start Farxiga 10mg  daily  [place] submitted to [place] patient [place] program  Escribe to [facility] mail order pharmacy  Will ship to patient's home every 3 months  -Extensively discussed pathophysiology of diabetes, recommended lifestyle interventions, dietary effects on blood sugar control  -Next A1C anticipated 3 MONTHS.    Written patient instructions provided.  Total time in face to face counseling 22 minutes.     [place], PharmD, BCPS Clinical Pharmacist, [facility]  II Phone [phone]

## 2022-04-30 NOTE — Patient Instructions (Addendum)
Visit Information  Following are the goals we discussed today:  Diabetes T2DM--A1C 6.7% and patient is currently on metformin and Jardiance 10mg  samples. Cost is an issue with brand name medications and Medicare.  We are able to obtain via patient assistance with great ease.  Will transition patient to [place] 10mg  by mouth every morning.  He will benefit from CV protection as well as glycemic control given CV history/CAD.     Continue Metformin (GFR 67) Start Farxiga 10mg  daily  Application submitted to [place] patient assistance program  Escribe to [facility] mail order pharmacy  Will ship to patient's home every 3 months  -Extensively discussed pathophysiology of diabetes, recommended lifestyle interventions, dietary effects on blood sugar control  -Next A1C anticipated 3 MONTHS.  Plan: Telephone follow up appointment with care management team member scheduled for:  2 months  Signature [place], PharmD, BCPS Clinical Pharmacist, [facility]  II Phone [phone]   Please call the care guide team at [phone] if you need to cancel or reschedule your appointment.   The patient verbalized understanding of instructions, educational materials, and care plan provided today and DECLINED offer to receive copy of patient instructions, educational materials, and care plan.

## 2022-05-14 MED ORDER — DAPAGLIFLOZIN PROPANEDIOL 10 MG PO TABS: 10.0000 mg | ORAL_TABLET | Freq: Every day | ORAL | 5 refills | Status: DC

## 2022-05-22 NOTE — Progress Notes (Unsigned)
Cardiology Office Note   Date:  05/23/2022   ID:  Randall Hall, DOB 04-11-1956, MRN [id number]  PCP:  Raliegh Ip, DO  Cardiologist:   None   Chief Complaint  Patient presents with   Coronary Artery Disease      History of Present Illness: Randall Hall is a 66 y.o. male who presents for follow-up of coronary disease.  He is status post DES to a vein graft as below.     Since I last saw him he has done well.  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  He is starting bow hunting season.  He is clearing some pastors for his 3 horses. The patient denies any new symptoms such as chest discomfort, neck or arm discomfort. There has been no new shortness of breath, PND or orthopnea. There have been no reported palpitations, presyncope or syncope.   Past Medical History:  Diagnosis Date   AKI (acute kidney injury) (HCC) 07/14/2016   CAD (coronary artery disease)    History of myocardial infarction    Hx of CABG    Hyperlipemia    MI (myocardial infarction) (HCC)    Tobacco abuse    Traumatic closed fracture of distal clavicle with minimal displacement, right, initial encounter 03/22/2017    Past Surgical History:  Procedure Laterality Date   CARDIAC CATHETERIZATION N/A 07/16/2016   Procedure: Left Heart Cath and Cors/Grafts Angiography;  Surgeon: Kathleene Hazel, MD;  Location: [facility] INVASIVE CV LAB;  Service: Cardiovascular;  Laterality: N/A;   CARDIAC CATHETERIZATION N/A 07/16/2016   Procedure: Coronary Stent Intervention;  Surgeon: Kathleene Hazel, MD;  Location: [facility] INVASIVE CV LAB;  Service: Cardiovascular;  Laterality: N/A;   CORONARY ANGIOPLASTY WITH STENT PLACEMENT     CORONARY ARTERY BYPASS GRAFT     2004 Dr. Laneta Simmers LIMA to the LAD, SVG to PDA and posterior lateral, SVG to diagonal, RIMA to obtuse marginal.  Last catheterization 2008.   LIPOMA EXCISION Left 07/02/2019   Procedure: MINOR EXCISION OF CYST LEFT THIGH;  Surgeon: Lucretia Roers, MD;   Location: [facility];  Service: General;  Laterality: Left;  Pt to arrive at 8:00am     Current Outpatient Medications  Medication Sig Dispense Refill   aspirin EC 81 MG tablet Take 1 tablet (81 mg total) by mouth daily. 90 tablet 3   atorvastatin (LIPITOR) 40 MG tablet TAKE 1 TABLET EVERY DAY  AT  6PM 90 tablet 3   azelastine (ASTELIN) 0.1 % nasal spray Place 1 spray into both nostrils 2 (two) times daily. 30 mL 12   Empagliflozin (JARDIANCE PO) Take 5 mg by mouth daily. Take 1 tablet by mouth daily     GARLIC PO Take 1 tablet by mouth daily.     metFORMIN (GLUCOPHAGE XR) 500 MG 24 hr tablet Take 1 tablet (500 mg total) by mouth daily with breakfast. 90 tablet 3   Tadalafil 2.5 MG TABS Take 1 tablet (2.5 mg total) by mouth daily. 90 tablet 3   TURMERIC PO Take 1,000 mg by mouth daily.     VITAMIN E PO Take 2 capsules by mouth daily.     dapagliflozin propanediol (FARXIGA) 10 MG TABS tablet Take 1 tablet (10 mg total) by mouth daily before breakfast. (Patient not taking: Reported on 05/23/2022) 90 tablet 5   No current facility-administered medications for this visit.    Allergies:   Sulfa antibiotics    ROS:  Please see the history of  present illness.   Otherwise, review of systems are positive for none.   All other systems are reviewed and negative.    PHYSICAL EXAM: VS:  BP 120/74   Pulse (!) 59   Ht 5\' 6"  (1.676 m)   Wt 228 lb 9.6 oz (103.7 kg)   SpO2 97%   BMI 36.90 kg/m  , BMI Body mass index is 36.9 kg/m. GENERAL:  Well appearing NECK:  No jugular venous distention, waveform within normal limits, carotid upstroke brisk and symmetric, no bruits, no thyromegaly LUNGS:  Clear to auscultation bilaterally CHEST:  Well healed sternotomy scar. HEART:  PMI not displaced or sustained,S1 and S2 within normal limits, no S3, no S4, no clicks, no rubs, no murmurs ABD:  Flat, positive bowel sounds normal in frequency in pitch, no bruits, no rebound, no guarding, no midline pulsatile mass,  no hepatomegaly, no splenomegaly EXT:  2 plus pulses throughout, no edema, no cyanosis no clubbing   EKG:  EKG is  ordered today. The ekg ordered today demonstrates sinus rhythm, rate 59, axis within normal limits, intervals within normal limits, no acute ST-T wave changes.   Recent Labs: 12/20/2021: ALT 21; BUN 13; Creatinine, Ser 1.20; Potassium 4.4; Sodium 139    Lipid Panel    Component Value Date/Time   CHOL 149 07/27/2021 1509   TRIG 234 (H) 07/27/2021 1509   HDL 25 (L) 07/27/2021 1509   CHOLHDL 6.0 (H) 07/27/2021 1509   CHOLHDL 7.9 07/15/2016 0136   VLDL 69 (H) 07/15/2016 0136   LDLCALC 84 07/27/2021 1509   LDLDIRECT 85 03/17/2020 1634      Wt Readings from Last 3 Encounters:  05/23/22 228 lb 9.6 oz (103.7 kg)  03/22/22 244 lb 6.4 oz (110.9 kg)  12/20/21 252 lb (114.3 kg)      Other studies Reviewed: Additional studies/ records that were reviewed today include: Labs. Review of the above records demonstrates:  Please see elsewhere in the note.     ASSESSMENT AND PLAN:  CAD:   The patient has no new sypmtoms.  No further cardiovascular testing is indicated.  We will continue with aggressive risk reduction and meds as listed.   DYSLIPIDEMIA:    LDL was 84 with an HDL of 25.  Since then he stopped drinking sugared drinks he is about to get this checked again in December.  Goal should be an LDL less than 70.   DM:  AC1 was 6.7.  No change in therapy.   TOBACCO:    He still smokes pipes.  He has had like for him to give this up.  OBESITY:    He is lost a total of about 25 pounds and I encouraged more the same.  Current medicines are reviewed at length with the patient today.  The patient does not have concerns regarding medicines.  The following changes have been made:  None  Labs/ tests ordered today include:  None  Orders Placed This Encounter  Procedures   EKG 12-Lead     Disposition:   FU with me in two years.     Signed, Minus Breeding, MD   05/23/2022 4:40 PM    [facility]

## 2022-05-23 ENCOUNTER — Encounter: Payer: Self-pay | Admitting: Cardiology

## 2022-05-23 ENCOUNTER — Ambulatory Visit: Payer: Medicare HMO | Attending: Cardiology | Admitting: Cardiology

## 2022-05-23 VITALS — BP 120/74 | HR 59 | Ht 66.0 in | Wt 228.6 lb

## 2022-05-23 DIAGNOSIS — J432 Centrilobular emphysema: Secondary | ICD-10-CM | POA: Diagnosis not present

## 2022-05-23 DIAGNOSIS — E785 Hyperlipidemia, unspecified: Secondary | ICD-10-CM

## 2022-05-23 DIAGNOSIS — I2510 Atherosclerotic heart disease of native coronary artery without angina pectoris: Secondary | ICD-10-CM | POA: Diagnosis not present

## 2022-05-23 DIAGNOSIS — E118 Type 2 diabetes mellitus with unspecified complications: Secondary | ICD-10-CM

## 2022-05-23 DIAGNOSIS — E119 Type 2 diabetes mellitus without complications: Secondary | ICD-10-CM | POA: Diagnosis not present

## 2022-05-23 NOTE — Patient Instructions (Signed)
Medication Instructions:  NO CHANGES  *If you need a refill on your cardiac medications before your next appointment, please call your pharmacy*   Follow-Up: At [facility], you and your health needs are our priority.  As part of our continuing mission to provide you with exceptional heart care, we have created designated Provider Care Teams.  These Care Teams include your primary Cardiologist (physician) and Advanced Practice Providers (APPs -  Physician Assistants and Nurse Practitioners) who all work together to provide you with the care you need, when you need it.  We recommend signing up for the patient portal called "MyChart".  Sign up information is provided on this After Visit Summary.  MyChart is used to connect with patients for Virtual Visits (Telemedicine).  Patients are able to view lab/test results, encounter notes, upcoming appointments, etc.  Non-urgent messages can be sent to your provider as well.   To learn more about what you can do with MyChart, go to [email or web address].    Your next appointment:   2 year(s)  The format for your next appointment:   In Person  Provider:   Dr. Percival Spanish

## 2022-05-28 ENCOUNTER — Other Ambulatory Visit: Payer: Self-pay | Admitting: Family Medicine

## 2022-05-28 DIAGNOSIS — N401 Enlarged prostate with lower urinary tract symptoms: Secondary | ICD-10-CM

## 2022-05-28 NOTE — Addendum Note (Signed)
Addended by: Angelena Form on: 05/28/2022 08:16 AM   Modules accepted: Orders

## 2022-05-30 IMAGING — CT CT CHEST LUNG CANCER SCREENING LOW DOSE W/O CM
2 of 5 series · 15 of 40 positions shown, 18 images · non-contrast
Comparison: None.

CLINICAL DATA: 65-year-old male with 39 pack year history of
smoking. Lung cancer screening.



[Series 3: lungs · axial · 0.78mm/px · z∈[-333,-49]mm · 12 of 314 slices shown, 15 images]
[im 15/314  mediastinal]
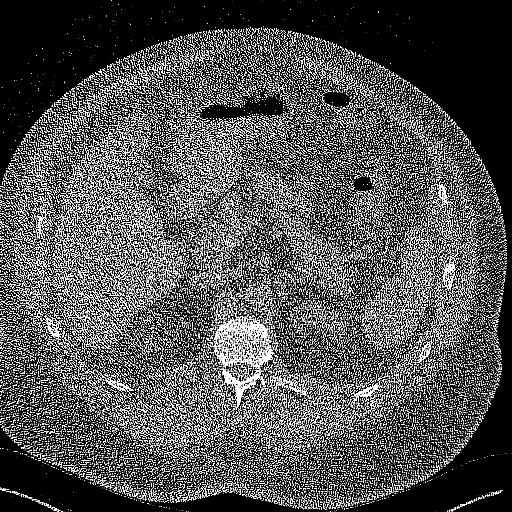
[im 15/314  lung]
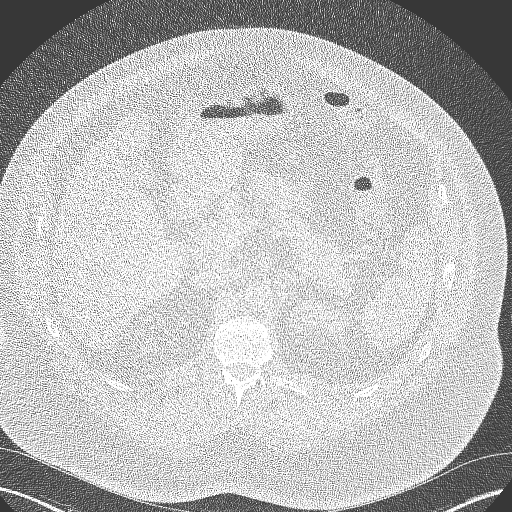
[im 43/314  lung]
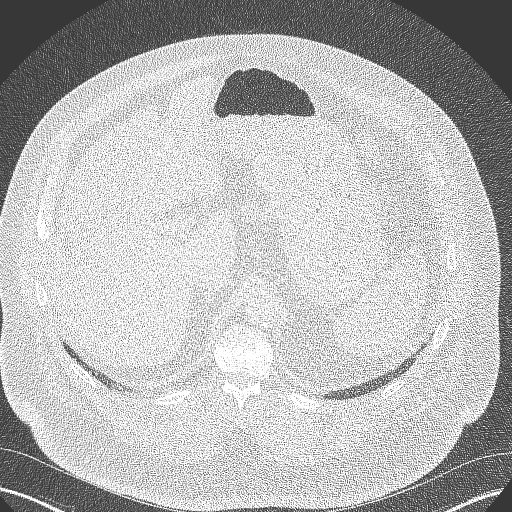
[im 72/314  lung]
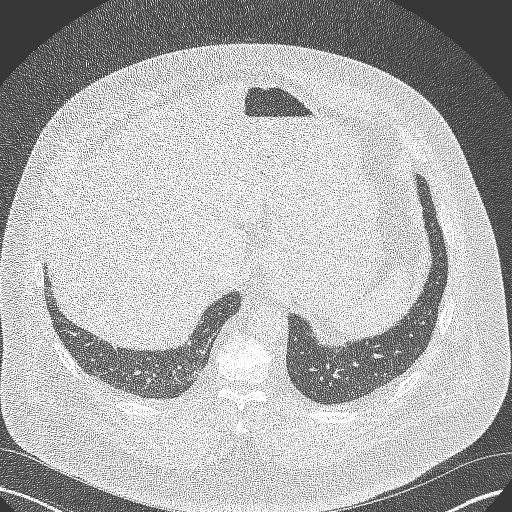
[im 100/314  lung]
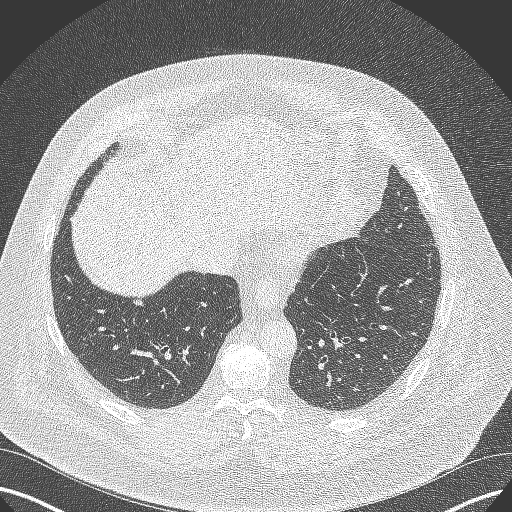
[im 114/314  mediastinal]
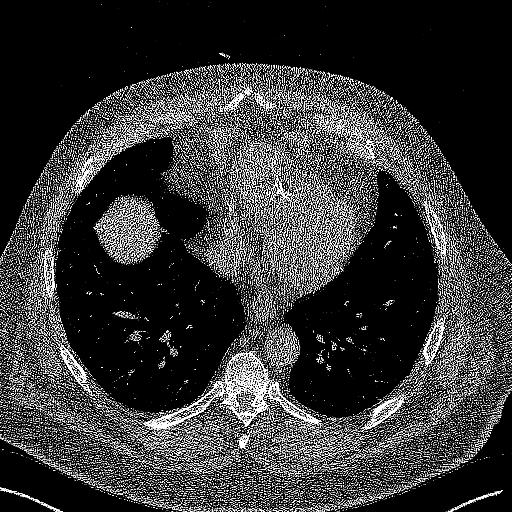
[im 114/314  lung]
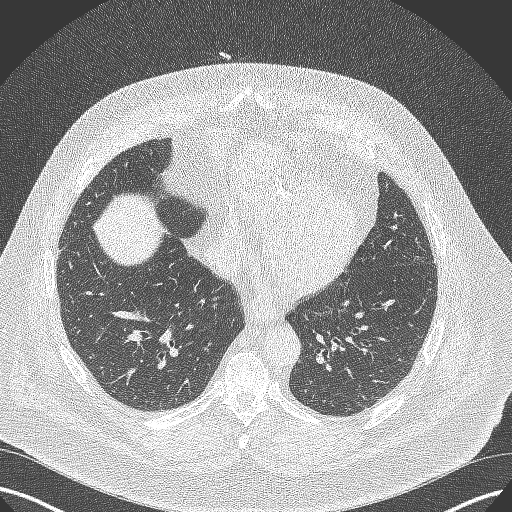
[im 143/314  lung]
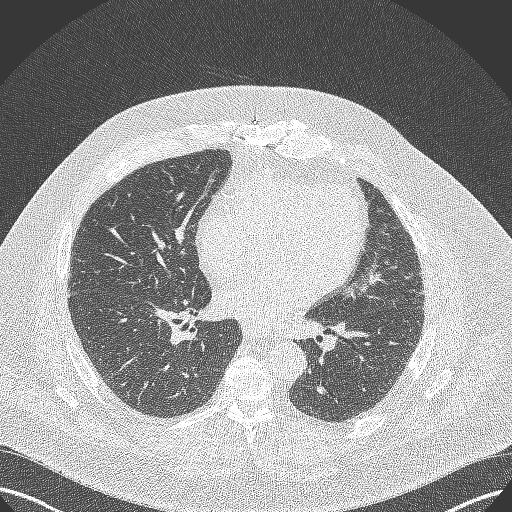
[im 171/314  lung]
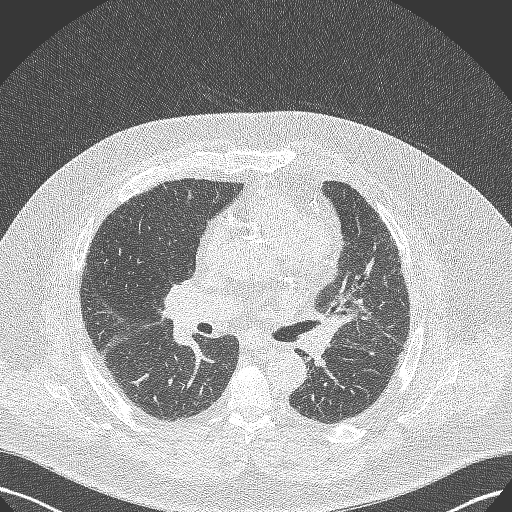
[im 200/314  lung]
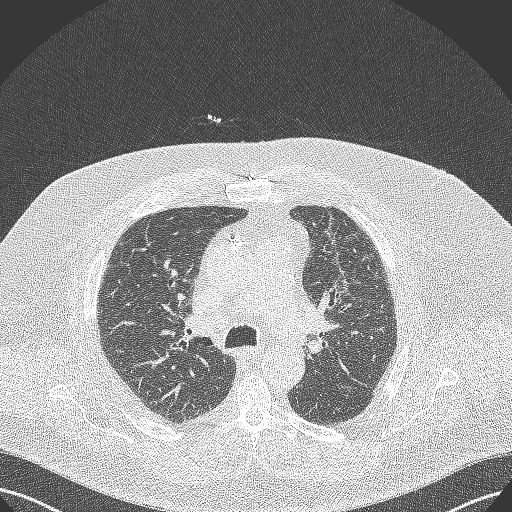
[im 214/314  mediastinal]
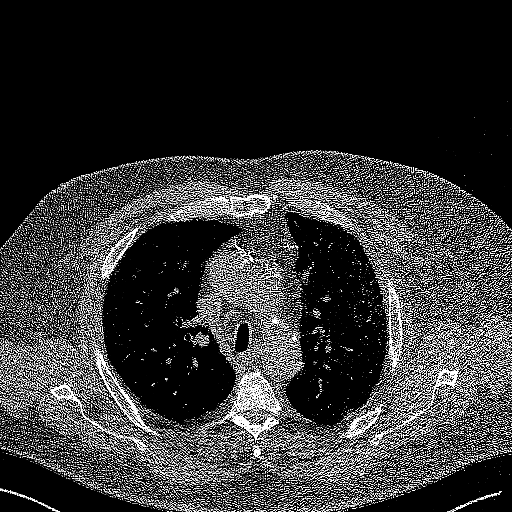
[im 214/314  lung]
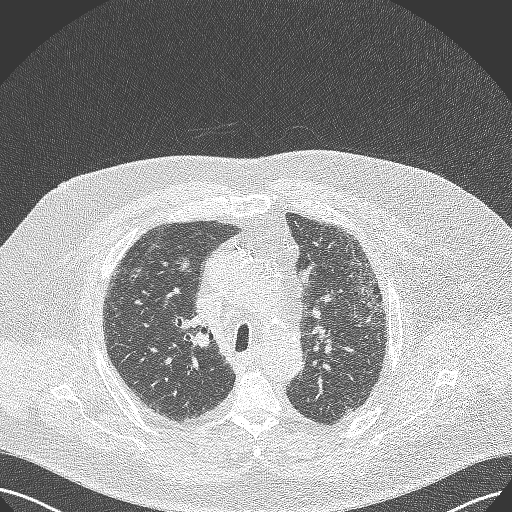
[im 242/314  lung]
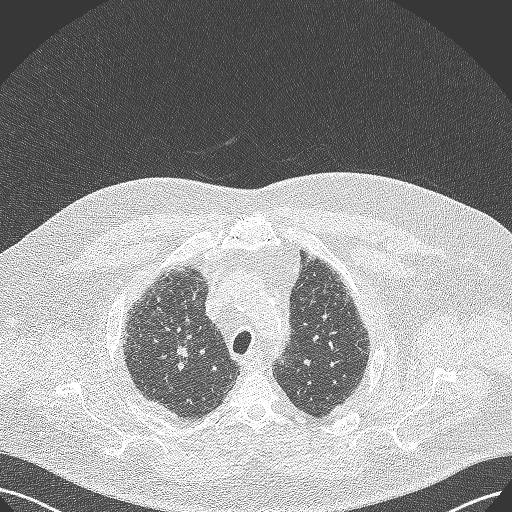
[im 271/314  lung]
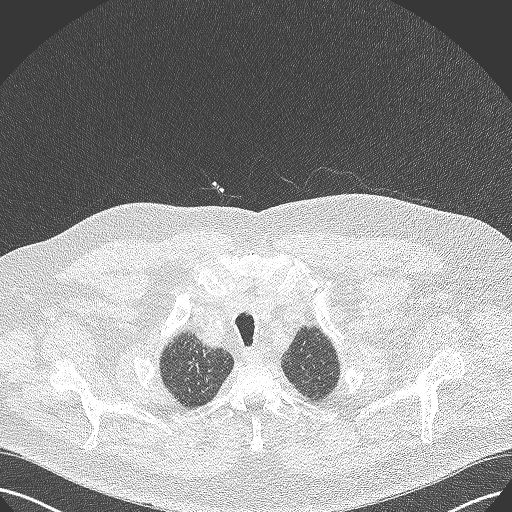
[im 299/314  lung]
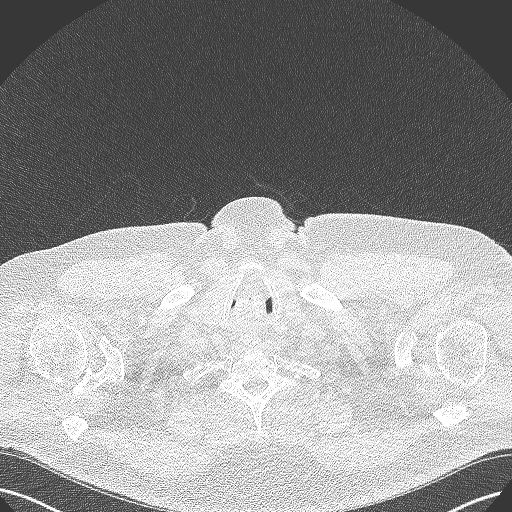

[Series 4: coronal · coronal · 0.63mm/px · 3 of 268 slices shown]
[im 54/268  lung]
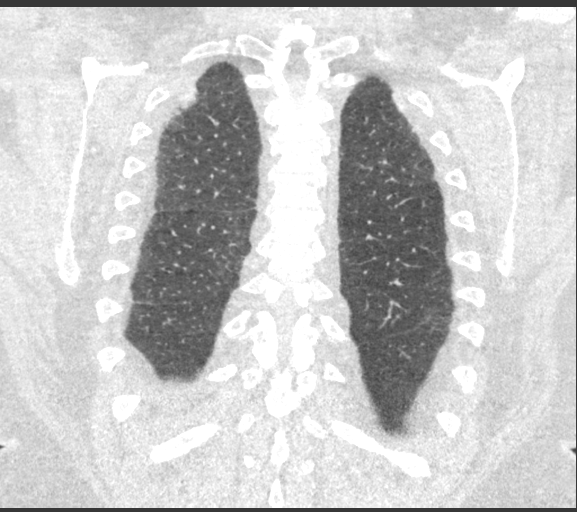
[im 107/268  lung]
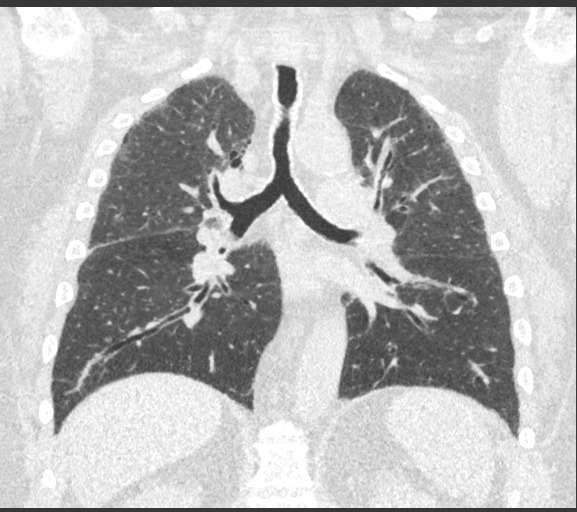
[im 161/268  lung]
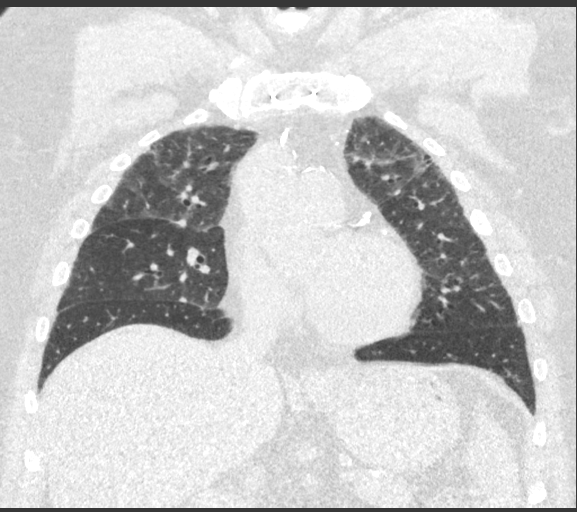

[15 of 40 positions shown; findings below may reference images not displayed]

FINDINGS: Cardiovascular: The heart size is normal. No substantial pericardial
effusion. Coronary artery calcification is evident. Status post
CABG. Mild atherosclerotic calcification is noted in the wall of the
thoracic aorta.

Mediastinum/Nodes: No mediastinal lymphadenopathy. No evidence for
gross hilar lymphadenopathy although assessment is limited by the
lack of intravenous contrast on the current study. The esophagus has
normal imaging features. There is no axillary lymphadenopathy.

Lungs/Pleura: Centrilobular emphsyema noted. Scattered tiny
bilateral pulmonary nodules identified without suspicious nodule or
mass on the current study. No focal airspace consolidation. No
pleural effusion.

Upper Abdomen: The liver shows diffusely decreased attenuation
suggesting fat deposition.

Musculoskeletal: No worrisome lytic or sclerotic osseous
abnormality. Multiple healed left rib fractures evident.
IMPRESSION: 1. Lung-RADS 2, benign appearance or behavior. Continue annual
screening with low-dose chest CT without contrast in 12 months.
2. Hepatic steatosis.
3. Aortic Atherosclerosis (8DPJP-KWA.A) and Emphysema (8DPJP-WQY.2).

## 2022-06-13 ENCOUNTER — Ambulatory Visit: Payer: Medicare HMO | Admitting: Pharmacist

## 2022-06-13 DIAGNOSIS — E118 Type 2 diabetes mellitus with unspecified complications: Secondary | ICD-10-CM

## 2022-06-13 DIAGNOSIS — I700 Atherosclerosis of aorta: Secondary | ICD-10-CM

## 2022-06-13 DIAGNOSIS — G720 Drug-induced myopathy: Secondary | ICD-10-CM

## 2022-06-13 NOTE — Progress Notes (Signed)
  Care Management   Follow Up Note   06/13/2022 Name: Randall Hall MRN: [id number] DOB: November 22, 1955   Referred by: Janora Norlander, DO Reason for referral : Chronic Care Management, Diabetes, and Hyperlipidemia   An unsuccessful telephone outreach was attempted today. The patient was referred to the case management team for assistance with care management and care coordination.   Follow Up Plan: Telephone follow up appointment with care management team member scheduled for:3 weeks    Regina Eck, PharmD, BCPS Clinical Pharmacist, [place]  II Phone [phone]

## 2022-06-17 ENCOUNTER — Telehealth: Payer: Self-pay

## 2022-06-17 NOTE — Telephone Encounter (Signed)
Received notification from [place] regarding patient assistance DENIAL for [place].   DENIED 05/20/22 DUE TO NOT [place] CRITERIA  Phone: [phone]

## 2022-06-18 ENCOUNTER — Encounter: Payer: Medicare HMO | Admitting: Nurse Practitioner

## 2022-06-26 ENCOUNTER — Telehealth: Payer: Self-pay | Admitting: Pharmacist

## 2022-06-26 NOTE — Progress Notes (Signed)
       06/27/2022 Name: Randall Hall MRN: [id number]       DOB: 1955-12-20     S:   60 yoM presents for T2DM diabetes evaluation, education, and management.  Patient was referred and last seen by Primary Care Provider on 03/22/22.  Patient's A1c has improved from 7.3% --> 6.7%, however additional CV protection and glycemic control is warranted.  Patient reports improved lifestyle changes that include restricting his carbs/sugar intake and increasing physical activity.  We will address T2DM medication regimen and statin adherence.   Insurance coverage/medication affordability: [employer] medicare   Patient reports adherence with medications. Current diabetes medications include: Jardiance, metformin Current hypertension medications include: ACEi (d/c'd in 2018--unsure  Goal 130/80 Current hyperlipidemia medications include: atorvastatin 49mg   LDL 84 on 07/2021   Patient denies hypoglycemic events.   Discussed meal planning options and Plate method for healthy eating Avoid sugary drinks and desserts Incorporate balanced protein, non starchy veggies, 1 serving of carbohydrate with each meal Increase water intake Increase physical activity as able     Patient-reported exercise habits: encouraged; patient reports increased exercise; appears motivated to make positive changes     O:   Recent Labs       Lab Results  Component Value Date    HGBA1C 6.7 (H) 03/22/2022      Lipid Panel   Labs (Brief)          Component Value Date/Time    CHOL 149 07/27/2021 1509    TRIG 234 (H) 07/27/2021 1509    HDL 25 (L) 07/27/2021 1509    CHOLHDL 6.0 (H) 07/27/2021 1509    CHOLHDL 7.9 07/15/2016 0136    VLDL 69 (H) 07/15/2016 0136    LDLCALC 84 07/27/2021 1509    LDLDIRECT 85 03/17/2020 1634         Home fasting blood sugars: <150 2 hour post-meal/random blood sugars: N/A.     Clinical Atherosclerotic Cardiovascular Disease (ASCVD): Yes    The ASCVD Risk score (Arnett DK, et al.,  2019) failed to calculate for the following reasons:   The patient has a prior MI or stroke diagnosis     A/P:   Diabetes T2DM--A1C 6.7% and patient is currently on metformin and Jardiance 10mg  samples. He will benefit from CV protection as well as glycemic control given CV history/CAD.  Patient was denied assistance with farxiga due to being over the income limit.  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  He is tolerating regimen well.    Continue Metformin (GFR 67) Continue Jardiance 10mg  daily Patient filling at the pharmacy     History of statin myopathy with simvastatin.  Will doucment G72.  Patient is working to tolerate atorvastatin low dose and has a surplus at home.  No further action is needed.  Patient will continue to take statin as prescribed.   -Extensively discussed pathophysiology of diabetes, recommended lifestyle interventions, dietary effects on blood sugar control   -Next A1C anticipated 3 MONTHS.     Written patient instructions provided.  Total time in face to face counseling 22 minutes.        Regina Eck, PharmD, BCPS Clinical Pharmacist, [facility] Phone [phone]

## 2022-06-26 NOTE — Telephone Encounter (Signed)
Patient made aware that he is denied for jardiance/farxiga patient assistance  Over income limit  He has been filling jardiance at the drug store Will update medication list

## 2022-06-27 ENCOUNTER — Ambulatory Visit (INDEPENDENT_AMBULATORY_CARE_PROVIDER_SITE_OTHER): Payer: Medicare HMO | Admitting: Pharmacist

## 2022-06-27 DIAGNOSIS — G720 Drug-induced myopathy: Secondary | ICD-10-CM | POA: Diagnosis not present

## 2022-06-27 DIAGNOSIS — E118 Type 2 diabetes mellitus with unspecified complications: Secondary | ICD-10-CM

## 2022-06-27 DIAGNOSIS — E785 Hyperlipidemia, unspecified: Secondary | ICD-10-CM | POA: Diagnosis not present

## 2022-06-27 DIAGNOSIS — E1169 Type 2 diabetes mellitus with other specified complication: Secondary | ICD-10-CM | POA: Diagnosis not present

## 2022-07-05 ENCOUNTER — Telehealth: Payer: Medicare HMO

## 2022-08-02 ENCOUNTER — Telehealth: Payer: Medicare HMO

## 2022-08-18 LAB — HM DIABETES EYE EXAM

## 2022-08-22 ENCOUNTER — Other Ambulatory Visit: Payer: Self-pay | Admitting: Family Medicine

## 2022-08-22 DIAGNOSIS — N401 Enlarged prostate with lower urinary tract symptoms: Secondary | ICD-10-CM

## 2022-09-20 ENCOUNTER — Ambulatory Visit (INDEPENDENT_AMBULATORY_CARE_PROVIDER_SITE_OTHER): Payer: Medicare HMO | Admitting: Family Medicine

## 2022-09-20 ENCOUNTER — Encounter: Payer: Self-pay | Admitting: Family Medicine

## 2022-09-20 VITALS — BP 125/72 | HR 67 | Temp 98.6°F | Ht 66.0 in | Wt 235.0 lb

## 2022-09-20 DIAGNOSIS — F5101 Primary insomnia: Secondary | ICD-10-CM

## 2022-09-20 DIAGNOSIS — E785 Hyperlipidemia, unspecified: Secondary | ICD-10-CM

## 2022-09-20 DIAGNOSIS — E118 Type 2 diabetes mellitus with unspecified complications: Secondary | ICD-10-CM | POA: Diagnosis not present

## 2022-09-20 DIAGNOSIS — I25700 Atherosclerosis of coronary artery bypass graft(s), unspecified, with unstable angina pectoris: Secondary | ICD-10-CM

## 2022-09-20 DIAGNOSIS — E1169 Type 2 diabetes mellitus with other specified complication: Secondary | ICD-10-CM | POA: Diagnosis not present

## 2022-09-20 DIAGNOSIS — I152 Hypertension secondary to endocrine disorders: Secondary | ICD-10-CM | POA: Diagnosis not present

## 2022-09-20 DIAGNOSIS — G720 Drug-induced myopathy: Secondary | ICD-10-CM

## 2022-09-20 DIAGNOSIS — J3489 Other specified disorders of nose and nasal sinuses: Secondary | ICD-10-CM | POA: Diagnosis not present

## 2022-09-20 DIAGNOSIS — I700 Atherosclerosis of aorta: Secondary | ICD-10-CM

## 2022-09-20 DIAGNOSIS — E1159 Type 2 diabetes mellitus with other circulatory complications: Secondary | ICD-10-CM

## 2022-09-20 DIAGNOSIS — Z6837 Body mass index (BMI) 37.0-37.9, adult: Secondary | ICD-10-CM

## 2022-09-20 LAB — BAYER DCA HB A1C WAIVED: HB A1C (BAYER DCA - WAIVED): 6.5 % — ABNORMAL HIGH (ref 4.8–5.6)

## 2022-09-20 MED ORDER — JARDIANCE 10 MG PO TABS: 10.0000 mg | ORAL_TABLET | Freq: Every morning | ORAL | 3 refills | Status: DC

## 2022-09-20 MED ORDER — BLOOD GLUCOSE MONITORING SUPPL DEVI: 0 refills | Status: AC

## 2022-09-20 MED ORDER — LANCET DEVICE MISC: 99 refills | Status: AC

## 2022-09-20 MED ORDER — ATORVASTATIN CALCIUM 40 MG PO TABS: ORAL_TABLET | ORAL | 3 refills | Status: DC

## 2022-09-20 MED ORDER — TRAZODONE HCL 50 MG PO TABS: 25.0000 mg | ORAL_TABLET | Freq: Every evening | ORAL | 3 refills | Status: DC | PRN

## 2022-09-20 MED ORDER — AZELASTINE HCL 0.1 % NA SOLN: 1.0000 | Freq: Two times a day (BID) | NASAL | 12 refills | Status: DC

## 2022-09-20 MED ORDER — BLOOD GLUCOSE TEST VI STRP: ORAL_STRIP | 99 refills | Status: AC

## 2022-09-20 NOTE — Addendum Note (Signed)
Addended by: Janora Norlander on: 09/20/2022 04:29 PM   Modules accepted: Orders

## 2022-09-20 NOTE — Progress Notes (Addendum)
Subjective: CC:DM PCP: Janora Norlander, DO VEL:FYBO Randall Hall is a 67 y.o. male presenting to clinic today for:  1. Type 2 Diabetes with hypertension, hyperlipidemia with history of CAD and aortic atherosclerosis:  Patient reports compliance with metformin extended release 500 mg daily.  Does not measure sugars.  He discontinued Jardiance after 1 month because he felt like it was making it difficult for him to urinate.  He would like to switch only to [place] because he thinks that perhaps it was the combination that was causing the issue.  He is experiencing some diarrheal stools with metformin.  Compliant with Lipitor.  Continues to smoke cigars up to twice daily  Last eye exam: Needs Last foot exam: Up-to-date Last A1c:  Lab Results  Component Value Date   HGBA1C 6.7 (H) 03/22/2022   Nephropathy screen indicated?:  Up-to-date Last flu, zoster and/or pneumovax:  Immunization History  Administered Date(s) Administered   Pneumococcal Conjugate-13 11/06/2020    ROS: Denies dizziness, LOC, polyuria, polydipsia, unintended weight loss/gain, foot ulcerations, numbness or tingling in extremities, shortness of breath or chest pain.  2.  Insomnia He reports some difficulty with sleep.  His wife had a trazodone and he used it and it did help.  He would like to get a prescription for himself  ROS: Per HPI  Allergies  Allergen Reactions   Sulfa Antibiotics Anaphylaxis    hives swelling   Zocor [Simvastatin] Other (See Comments)    MYOPATHY   Past Medical History:  Diagnosis Date   AKI (acute kidney injury) ([place]) 07/14/2016   CAD (coronary artery disease)    History of myocardial infarction    Hx of CABG    Hyperlipemia    MI (myocardial infarction) ([place])    Tobacco abuse    Traumatic closed fracture of distal clavicle with minimal displacement, right, initial encounter 03/22/2017    Current Outpatient Medications:    aspirin EC 81 MG tablet, Take 1 tablet (81 mg total)  by mouth daily., Disp: 90 tablet, Rfl: 3   atorvastatin (LIPITOR) 40 MG tablet, TAKE 1 TABLET EVERY DAY  AT  6PM, Disp: 90 tablet, Rfl: 3   azelastine (ASTELIN) 0.1 % nasal spray, Place 1 spray into both nostrils 2 (two) times daily., Disp: 30 mL, Rfl: 12   GARLIC PO, Take 1 tablet by mouth daily., Disp: , Rfl:    metFORMIN (GLUCOPHAGE XR) 500 MG 24 hr tablet, Take 1 tablet (500 mg total) by mouth daily with breakfast., Disp: 90 tablet, Rfl: 3   Tadalafil 2.5 MG TABS, Take 1 tablet (2.5 mg total) by mouth daily., Disp: 90 tablet, Rfl: 3   TURMERIC PO, Take 1,000 mg by mouth daily., Disp: , Rfl:    VITAMIN E PO, Take 2 capsules by mouth daily., Disp: , Rfl:    JARDIANCE 10 MG TABS tablet, Take 10 mg by mouth every morning. (Patient not taking: Reported on 09/20/2022), Disp: , Rfl:  Social History   Socioeconomic History   Marital status: Married    Spouse name: Not on file   Number of children: 4   Years of education: Not on file   Highest education level: Not on file  Occupational History   Occupation: TRUCK DRIVER    Employer: [employer]  Tobacco Use   Smoking status: Every Day    Packs/day: 1.00    Years: 21.00    Total pack years: 21.00    Types: Cigarettes   Smokeless tobacco: Never  Vaping  Use   Vaping Use: Never used  Substance and Sexual Activity   Alcohol use: Not Currently   Drug use: Not Currently   Sexual activity: Not on file  Other Topics Concern   Not on file  Social History Narrative   ** Merged History Encounter ** ** Data from: 12/28/16 [facility]  He has seven grandchildren.  ** Data from: 03/23/17 Enc Dept: [place]   Lives with wife   Self-employed truck driver   Social Determinants of Health   Financial Resource Strain: Low Risk  (10/01/2021)   Overall Financial Resource Strain (CARDIA)    Difficulty of Paying Living Expenses: Not hard at all  Food Insecurity: No Food Insecurity (10/01/2021)   Hunger Vital Sign     Worried About Running Out of Food in the Last Year: Never true    [place] in the Last Year: Never true  Transportation Needs: No Transportation Needs (10/01/2021)   PRAPARE - Hydrologist (Medical): No    Lack of Transportation (Non-Medical): No  Physical Activity: Sufficiently Active (10/01/2021)   Exercise Vital Sign    Days of Exercise per Week: 5 days    Minutes of Exercise per Session: 30 min  Stress: No Stress Concern Present (10/01/2021)   [place]    Feeling of Stress : Not at all  Social Connections: [place] (10/01/2021)   Social Connection and Isolation Panel [NHANES]    Frequency of Communication with Friends and Family: More than three times a week    Frequency of Social Gatherings with Friends and Family: More than three times a week    Attends Religious Services: More than 4 times per year    Active Member of [employer] or Organizations: Yes    Attends Music therapist: More than 4 times per year    Marital Status: Married  Human resources officer Violence: Not At Risk (10/01/2021)   Humiliation, Afraid, Rape, and Kick questionnaire    Fear of Current or Ex-Partner: No    Emotionally Abused: No    Physically Abused: No    Sexually Abused: No   Family History  Problem Relation Age of Onset   Diabetes Mother    CAD Father 27       Died   CAD Brother     Objective: Office vital signs reviewed. BP 125/72   Pulse 67   Temp 98.6 F (37 C)   Ht 5\' 6"  (1.676 m)   Wt 235 lb (106.6 kg)   SpO2 94%   BMI 37.93 kg/m   Physical Examination:  General: Awake, alert, obese., No acute distress HEENT: Sclera white.  Moist mucous membranes Cardio: regular rate and rhythm, S1S2 heard, no murmurs appreciated Pulm: Mild expiratory wheezes, no rhonchi or rales; normal work of breathing on room air GI: Protuberant. MSK: Ambulating independently  Assessment/  Plan: 67 y.o. 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  Wishes to be on solely Jardiance at this has been sent.  We will plan to advance dose to 25 mg if he is not still at goal at next visit.  He has been scheduled with [facility] for diabetic eye exam.  He will continue statin, Jardiance.  Counseled on smoking cessation  Astelin renewed for rhinorrhea.  Trazodone sent.  Use only if needed   Orders Placed This Encounter  Procedures   Bayer DCA Hb A1c Waived   CBC with Differential/Platelet   CMP14+EGFR   No orders of the defined types were placed in this encounter.    Raliegh Ip, DO [facility] [phone]

## 2022-09-21 LAB — CBC WITH DIFFERENTIAL/PLATELET
Basophils Absolute: 0.1 10*3/uL (ref 0.0–0.2)
Basos: 1 %
EOS (ABSOLUTE): 0.2 10*3/uL (ref 0.0–0.4)
Eos: 2 %
Hematocrit: 46.4 % (ref 37.5–51.0)
Hemoglobin: 15.6 g/dL (ref 13.0–17.7)
Immature Grans (Abs): 0 10*3/uL (ref 0.0–0.1)
Immature Granulocytes: 0 %
Lymphocytes Absolute: 3.4 10*3/uL — ABNORMAL HIGH (ref 0.7–3.1)
Lymphs: 35 %
MCH: 31.7 pg (ref 26.6–33.0)
MCHC: 33.6 g/dL (ref 31.5–35.7)
MCV: 94 fL (ref 79–97)
Monocytes Absolute: 0.7 10*3/uL (ref 0.1–0.9)
Monocytes: 7 %
Neutrophils Absolute: 5.4 10*3/uL (ref 1.4–7.0)
Neutrophils: 55 %
Platelets: 184 10*3/uL (ref 150–450)
RBC: 4.92 x10E6/uL (ref 4.14–5.80)
RDW: 12.3 % (ref 11.6–15.4)
WBC: 9.8 10*3/uL (ref 3.4–10.8)

## 2022-09-21 LAB — CMP14+EGFR
ALT: 11 IU/L (ref 0–44)
AST: 13 IU/L (ref 0–40)
Albumin/Globulin Ratio: 2 (ref 1.2–2.2)
Albumin: 4.3 g/dL (ref 3.9–4.9)
Alkaline Phosphatase: 84 IU/L (ref 44–121)
BUN/Creatinine Ratio: 12 (ref 10–24)
BUN: 15 mg/dL (ref 8–27)
Bilirubin Total: 0.4 mg/dL (ref 0.0–1.2)
CO2: 24 mmol/L (ref 20–29)
Calcium: 9.5 mg/dL (ref 8.6–10.2)
Chloride: 100 mmol/L (ref 96–106)
Creatinine, Ser: 1.27 mg/dL (ref 0.76–1.27)
Globulin, Total: 2.2 g/dL (ref 1.5–4.5)
Glucose: 158 mg/dL — ABNORMAL HIGH (ref 70–99)
Potassium: 4.1 mmol/L (ref 3.5–5.2)
Sodium: 139 mmol/L (ref 134–144)
Total Protein: 6.5 g/dL (ref 6.0–8.5)
eGFR: 62 mL/min/{1.73_m2} (ref >59)

## 2022-10-03 ENCOUNTER — Ambulatory Visit (INDEPENDENT_AMBULATORY_CARE_PROVIDER_SITE_OTHER): Payer: Medicare HMO

## 2022-10-03 VITALS — Ht 66.0 in | Wt 223.0 lb

## 2022-10-03 DIAGNOSIS — Z0000 Encounter for general adult medical examination without abnormal findings: Secondary | ICD-10-CM

## 2022-10-03 DIAGNOSIS — E119 Type 2 diabetes mellitus without complications: Secondary | ICD-10-CM | POA: Diagnosis not present

## 2022-10-03 DIAGNOSIS — Z0100 Encounter for examination of eyes and vision without abnormal findings: Secondary | ICD-10-CM

## 2022-10-03 DIAGNOSIS — J432 Centrilobular emphysema: Secondary | ICD-10-CM | POA: Diagnosis not present

## 2022-10-03 NOTE — Patient Instructions (Signed)
[name] , Thank you for taking time to come for your Medicare Wellness Visit. I appreciate your ongoing commitment to your health goals. Please review the following plan we discussed and let me know if I can assist you in the future.   These are the goals we discussed:  Goals      DIET - INCREASE WATER INTAKE     Weight (lb) < 200 lb (90.7 kg)        This is a list of the screening recommended for you and due dates:  Health Maintenance  Topic Date Due   Eye exam for diabetics  08/26/2022   Screening for Lung Cancer  10/17/2022   COVID-19 Vaccine (1) 10/06/2022*   Flu Shot  11/24/2022*   Zoster (Shingles) Vaccine (1 of 2) 12/20/2022*   Pneumonia Vaccine (2 of 2 - PPSV23 or PCV20) 09/21/2023*   Colon Cancer Screening  09/21/2023*   Yearly kidney health urinalysis for diabetes  12/21/2022   Complete foot exam   12/21/2022   Hemoglobin A1C  03/21/2023   Yearly kidney function blood test for diabetes  09/21/2023   Medicare Annual Wellness Visit  10/04/2023   Hepatitis C Screening: USPSTF Recommendation to screen - Ages 18-79 yo.  Completed   HPV Vaccine  Aged Out   DTaP/Tdap/Td vaccine  Discontinued  *Topic was postponed. The date shown is not the original due date.    Advanced directives: Please bring a copy of your health care power of attorney and living will to the office to be added to your chart at your convenience.   Conditions/risks identified: Aim for 30 minutes of exercise or brisk walking, 6-8 glasses of water, and 5 servings of fruits and vegetables each day.   Next appointment: Follow up in one year for your annual wellness visit.   Preventive Care 23 Years and Older, Male  Preventive care refers to lifestyle choices and visits with your health care provider that can promote health and wellness. What does preventive care include? A yearly physical exam. This is also called an annual well check. Dental exams once or twice a year. Routine eye exams. Ask your  health care provider how often you should have your eyes checked. Personal lifestyle choices, including: Daily care of your teeth and gums. Regular physical activity. Eating a healthy diet. Avoiding tobacco and drug use. Limiting alcohol use. Practicing safe sex. Taking low doses of aspirin every day. Taking vitamin and mineral supplements as recommended by your health care provider. What happens during an annual well check? The services and screenings done by your health care provider during your annual well check will depend on your age, overall health, lifestyle risk factors, and family history of disease. Counseling  Your health care provider may ask you questions about your: Alcohol use. Tobacco use. Drug use. Emotional well-being. Home and relationship well-being. Sexual activity. Eating habits. History of falls. Memory and ability to understand (cognition). Work and work Statistician. Screening  You may have the following tests or measurements: Height, weight, and BMI. Blood pressure. Lipid and cholesterol levels. These may be checked every 5 years, or more frequently if you are over 44 years old. Skin check. Lung cancer screening. You may have this screening every year starting at age 37 if you have a 30-pack-year history of smoking and currently smoke or have quit within the past 15 years. Fecal occult blood test (FOBT) of the stool. You may have this test every year starting at age 90. Flexible  sigmoidoscopy or colonoscopy. You may have a sigmoidoscopy every 5 years or a colonoscopy every 10 years starting at age 37. Prostate cancer screening. Recommendations will vary depending on your family history and other risks. Hepatitis C blood test. Hepatitis B blood test. Sexually transmitted disease (STD) testing. Diabetes screening. This is done by checking your blood sugar (glucose) after you have not eaten for a while (fasting). You may have this done every 1-3  years. Abdominal aortic aneurysm (AAA) screening. You may need this if you are a current or former smoker. Osteoporosis. You may be screened starting at age 59 if you are at high risk. Talk with your health care provider about your test results, treatment options, and if necessary, the need for more tests. Vaccines  Your health care provider may recommend certain vaccines, such as: Influenza vaccine. This is recommended every year. Tetanus, diphtheria, and acellular pertussis (Tdap, Td) vaccine. You may need a Td booster every 10 years. Zoster vaccine. You may need this after age 16. Pneumococcal 13-valent conjugate (PCV13) vaccine. One dose is recommended after age 26. Pneumococcal polysaccharide (PPSV23) vaccine. One dose is recommended after age 28. Talk to your health care provider about which screenings and vaccines you need and how often you need them. This information is not intended to replace advice given to you by your health care provider. Make sure you discuss any questions you have with your health care provider. Document Released: 09/08/2015 Document Revised: 05/01/2016 Document Reviewed: 06/13/2015 Elsevier Interactive Patient Education  2017 [place] Prevention in the Home Falls can cause injuries. They can happen to people of all ages. There are many things you can do to make your home safe and to help prevent falls. What can I do on the outside of my home? Regularly fix the edges of walkways and driveways and fix any cracks. Remove anything that might make you trip as you walk through a door, such as a raised step or threshold. Trim any bushes or trees on the path to your home. Use bright outdoor lighting. Clear any walking paths of anything that might make someone trip, such as rocks or tools. Regularly check to see if handrails are loose or broken. Make sure that both sides of any steps have handrails. Any raised decks and porches should have guardrails on the  edges. Have any leaves, snow, or ice cleared regularly. Use sand or salt on walking paths during winter. Clean up any spills in your garage right away. This includes oil or grease spills. What can I do in the bathroom? Use night lights. Install grab bars by the toilet and in the tub and shower. Do not use towel bars as grab bars. Use non-skid mats or decals in the tub or shower. If you need to sit down in the shower, use a plastic, non-slip stool. Keep the floor dry. Clean up any water that spills on the floor as soon as it happens. Remove soap buildup in the tub or shower regularly. Attach bath mats securely with double-sided non-slip rug tape. Do not have throw rugs and other things on the floor that can make you trip. What can I do in the bedroom? Use night lights. Make sure that you have a light by your bed that is easy to reach. Do not use any sheets or blankets that are too big for your bed. They should not hang down onto the floor. Have a firm chair that has side arms. You can use this for  support while you get dressed. Do not have throw rugs and other things on the floor that can make you trip. What can I do in the kitchen? Clean up any spills right away. Avoid walking on wet floors. Keep items that you use a lot in easy-to-reach places. If you need to reach something above you, use a strong step stool that has a grab bar. Keep electrical cords out of the way. Do not use floor polish or wax that makes floors slippery. If you must use wax, use non-skid floor wax. Do not have throw rugs and other things on the floor that can make you trip. What can I do with my stairs? Do not leave any items on the stairs. Make sure that there are handrails on both sides of the stairs and use them. Fix handrails that are broken or loose. Make sure that handrails are as long as the stairways. Check any carpeting to make sure that it is firmly attached to the stairs. Fix any carpet that is loose or  worn. Avoid having throw rugs at the top or bottom of the stairs. If you do have throw rugs, attach them to the floor with carpet tape. Make sure that you have a light switch at the top of the stairs and the bottom of the stairs. If you do not have them, ask someone to add them for you. What else can I do to help prevent falls? Wear shoes that: Do not have high heels. Have rubber bottoms. Are comfortable and fit you well. Are closed at the toe. Do not wear sandals. If you use a stepladder: Make sure that it is fully opened. Do not climb a closed stepladder. Make sure that both sides of the stepladder are locked into place. Ask someone to hold it for you, if possible. Clearly mark and make sure that you can see: Any grab bars or handrails. First and last steps. Where the edge of each step is. Use tools that help you move around (mobility aids) if they are needed. These include: Canes. Walkers. Scooters. Crutches. Turn on the lights when you go into a dark area. Replace any light bulbs as soon as they burn out. Set up your furniture so you have a clear path. Avoid moving your furniture around. If any of your floors are uneven, fix them. If there are any pets around you, be aware of where they are. Review your medicines with your doctor. Some medicines can make you feel dizzy. This can increase your chance of falling. Ask your doctor what other things that you can do to help prevent falls. This information is not intended to replace advice given to you by your health care provider. Make sure you discuss any questions you have with your health care provider. Document Released: 06/08/2009 Document Revised: 01/18/2016 Document Reviewed: 09/16/2014 Elsevier Interactive Patient Education  2017 [employer].

## 2022-10-03 NOTE — Progress Notes (Signed)
Subjective:   Randall Hall is a 67 y.o. male who presents for Medicare Annual/Subsequent preventive examination. I connected with  Randall Hall on 10/03/22 by a audio enabled telemedicine application and verified that I am speaking with the correct person using two identifiers.  Patient Location: Home  Provider Location: Home Office  I discussed the limitations of evaluation and management by telemedicine. The patient expressed understanding and agreed to proceed.  Review of Systems     Cardiac Risk Factors include: advanced age (>61men, >88 women);male gender;diabetes mellitus;dyslipidemia;hypertension     Objective:    Today's Vitals   10/03/22 0912  Weight: 223 lb (101.2 kg)  Height: 5\' 6"  (1.676 m)   Body mass index is 35.99 kg/m.     10/03/2022    9:18 AM 10/01/2021    4:35 PM 03/23/2017    2:00 AM 12/28/2016    6:45 PM 07/14/2016    9:44 PM 07/14/2016    4:51 PM  Advanced Directives  Does Patient Have a Medical Advance Directive? Yes No  No No No  Type of Paramedic of [place];Living will       Copy of [place] in Chart? No - copy requested       Would patient like information on creating a medical advance directive?  No - Patient declined   No - patient declined information No - patient declined information     Information is confidential and restricted. Go to Review Flowsheets to unlock data.    Current Medications (verified) Outpatient Encounter Medications as of 10/03/2022  Medication Sig   aspirin EC 81 MG tablet Take 1 tablet (81 mg total) by mouth daily.   atorvastatin (LIPITOR) 40 MG tablet TAKE 1 TABLET EVERY DAY  AT  6PM   azelastine (ASTELIN) 0.1 % nasal spray Place 1 spray into both nostrils 2 (two) times daily.   Blood Glucose Monitoring Suppl DEVI UAD to check sugar daily.  May substitute to any manufacturer covered by patient's insurance. E11.9May substitute to any manufacturer covered by patient's  insurance.   GARLIC PO Take 1 tablet by mouth daily.   Glucose Blood (BLOOD GLUCOSE TEST STRIPS) STRP UAD to check sugar daily.  May substitute to any manufacturer covered by patient's insurance. E11.9   JARDIANCE 10 MG TABS tablet Take 1 tablet (10 mg total) by mouth every morning.   Lancet Device MISC UAD to check sugar daily.  May substitute to any manufacturer covered by patient's insurance. E11.9 May substitute to any manufacturer covered by patient's insurance.   Tadalafil 2.5 MG TABS Take 1 tablet (2.5 mg total) by mouth daily.   traZODone (DESYREL) 50 MG tablet Take 0.5-1 tablets (25-50 mg total) by mouth at bedtime as needed for sleep.   TURMERIC PO Take 1,000 mg by mouth daily.   VITAMIN E PO Take 2 capsules by mouth daily.   No facility-administered encounter medications on file as of 10/03/2022.    Allergies (verified) Sulfa antibiotics and Zocor [simvastatin]   History: Past Medical History:  Diagnosis Date   AKI (acute kidney injury) ([place]) 07/14/2016   CAD (coronary artery disease)    History of myocardial infarction    Hx of CABG    Hyperlipemia    MI (myocardial infarction) ([place])    Tobacco abuse    Traumatic closed fracture of distal clavicle with minimal displacement, right, initial encounter 03/22/2017   Past Surgical History:  Procedure Laterality Date   CARDIAC CATHETERIZATION N/A 07/16/2016  Procedure: Left Heart Cath and Cors/Grafts Angiography;  Surgeon: Kathleene Hazel, MD;  Location[facility] INVASIVE CV LAB;  Service: Cardiovascular;  Laterality: N/A;   CARDIAC CATHETERIZATION N/A 07/16/2016   Procedure: Coronary Stent Intervention;  Surgeon: Kathleene Hazel, MD;  Location: [facility] INVASIVE CV LAB;  Service: Cardiovascular;  Laterality: N/A;   CORONARY ANGIOPLASTY WITH STENT PLACEMENT     CORONARY ARTERY BYPASS GRAFT     2004 Dr. Laneta Simmers LIMA to the LAD, SVG to PDA and posterior lateral, SVG to diagonal, RIMA to obtuse marginal.  Last catheterization  2008.   LIPOMA EXCISION Left 07/02/2019   Procedure: MINOR EXCISION OF CYST LEFT THIGH;  Surgeon: Lucretia Roers, MD;  Location: [facility] ORS;  Service: General;  Laterality: Left;  Pt to arrive at 8:00am   Family History  Problem Relation Age of Onset   Diabetes Mother    CAD Father 86       Died   CAD Brother    Social History   Socioeconomic History   Marital status: Married    Spouse name: Not on file   Number of children: 4   Years of education: Not on file   Highest education level: Not on file  Occupational History   Occupation: TRUCK DRIVER    Employer: [employer]  Tobacco Use   Smoking status: Every Day    Packs/day: 1.00    Years: 21.00    Total pack years: 21.00    Types: Cigarettes   Smokeless tobacco: Never  Vaping Use   Vaping Use: Never used  Substance and Sexual Activity   Alcohol use: Not Currently   Drug use: Not Currently   Sexual activity: Not on file  Other Topics Concern   Not on file  Social History Narrative   ** Merged History Encounter ** ** Data from: 12/28/16 [facility]ried.  He has seven grandchildren.  ** Data from: 03/23/17 Enc Dept: [facility]   Lives with wife   Self-employed truck driver   Social Determinants of Health   Financial Resource Strain: Low Risk  (10/03/2022)   Overall Financial Resource Strain (CARDIA)    Difficulty of Paying Living Expenses: Not hard at all  Food Insecurity: No Food Insecurity (10/03/2022)   Hunger Vital Sign    Worried About Running Out of Food in the Last Year: Never true    Ran Out of Food in the Last Year: Never true  Transportation Needs: No Transportation Needs (10/03/2022)   PRAPARE - Administrator, Civil Service (Medical): No    Lack of Transportation (Non-Medical): No  Physical Activity: Sufficiently Active (10/03/2022)   Exercise Vital Sign    Days of Exercise per Week: 5 days    Minutes of Exercise per Session: 30 min  Stress: No Stress Concern  Present (10/03/2022)   [employer] of Occupational Health - Occupational Stress Questionnaire    Feeling of Stress : Not at all  Social Connections: Moderately Integrated (10/03/2022)   Social Connection and Isolation Panel [NHANES]    Frequency of Communication with Friends and Family: More than three times a week    Frequency of Social Gatherings with Friends and Family: More than three times a week    Attends Religious Services: Never    Database administrator or Organizations: Yes    Attends Engineer, structural: More than 4 times per year    Marital Status: Married    Tobacco Counseling Ready  to quit: Not Answered Counseling given: Not Answered   Clinical Intake:  Pre-visit preparation completed: Yes  Pain : No/denies pain     Nutritional Risks: None Diabetes: Yes CBG done?: No Did pt. bring in CBG monitor from home?: No  How often do you need to have someone help you when you read instructions, pamphlets, or other written materials from your doctor or pharmacy?: 1 - Never  Diabetic?yes  Nutrition Risk Assessment:  Has the patient had any N/V/D within the last 2 months?  No  Does the patient have any non-healing wounds?  No  Has the patient had any unintentional weight loss or weight gain?  No   Diabetes:  Is the patient diabetic?  Yes  If diabetic, was a CBG obtained today?  No  Did the patient bring in their glucometer from home?  No  How often do you monitor your CBG's? 2 x week .   Financial Strains and Diabetes Management:  Are you having any financial strains with the device, your supplies or your medication? No .  Does the patient want to be seen by Chronic Care Management for management of their diabetes?  No  Would the patient like to be referred to a Nutritionist or for Diabetic Management?  No   Diabetic Exams:  Diabetic Eye Exam: Overdue for diabetic eye exam. Pt has been advised about the importance in completing this exam. Patient  advised to call and schedule an eye exam. Diabetic Foot Exam: Overdue, Pt has been advised about the importance in completing this exam. Pt is scheduled for diabetic foot exam on next office visit .   Interpreter Needed?: No  Information entered by :: Jadene Pierini, LPN   Activities of Daily Living    10/03/2022    9:18 AM 10/02/2022    3:17 PM  In your present state of health, do you have any difficulty performing the following activities:  Hearing? 0 0  Vision? 0 0  Difficulty concentrating or making decisions? 0 0  Walking or climbing stairs? 0 0  Dressing or bathing? 0 0  Doing errands, shopping? 0 0  Preparing Food and eating ? N N  Using the Toilet? N N  In the past six months, have you accidently leaked urine? N N  Do you have problems with loss of bowel control? N N  Managing your Medications? N N  Managing your Finances? N N  Housekeeping or managing your Housekeeping? N N    Patient Care Team: Janora Norlander, DO as PCP - General (Family Medicine) Minus Breeding, MD as Consulting Physician (Cardiology) Harlen Labs, MD as Referring Physician (Optometry) Blanca Friend Royce Macadamia, [facility] as Pharmacist (Family Medicine)  Indicate any recent Medical Services you may have received from other than [facility] providers in the past year (date may be approximate).     Assessment:   This is a routine wellness examination for Burle.  Hearing/Vision screen Vision Screening - Comments:: Referral 10/03/2022  Dietary issues and exercise activities discussed: Current Exercise Habits: Home exercise routine, Type of exercise: walking, Time (Minutes): 30, Frequency (Times/Week): 5, Weekly Exercise (Minutes/Week): 150, Intensity: Mild, Exercise limited by: None identified   Goals Addressed             This Visit's Progress    DIET - INCREASE WATER INTAKE         Depression Screen    10/03/2022    9:17 AM 09/20/2022    2:07 PM 12/20/2021  4:15 PM 10/01/2021    4:31 PM 07/27/2021     4:18 PM 11/06/2020    2:53 PM 03/17/2020    3:46 PM  PHQ 2/9 Scores  PHQ - 2 Score 0 0 0 0 0 0 0  PHQ- 9 Score 0 0    0     Fall Risk    10/03/2022    9:14 AM 10/02/2022    3:17 PM 09/20/2022    2:07 PM 12/20/2021    4:15 PM 10/01/2021    4:34 PM  Fall Risk   Falls in the past year? 0 0 0 0 0  Number falls in past yr: 0 0 0  0  Injury with Fall? 0 0 0  0  Risk for fall due to : No Fall Risks  No Fall Risks  No Fall Risks  Follow up Falls prevention discussed  Education provided  Falls prevention discussed    FALL RISK PREVENTION PERTAINING TO THE HOME:  Any stairs in or around the home? No  If so, are there any without handrails? No  Home free of loose throw rugs in walkways, pet beds, electrical cords, etc? Yes  Adequate lighting in your home to reduce risk of falls? Yes   ASSISTIVE DEVICES UTILIZED TO PREVENT FALLS:  Life alert? No  Use of a cane, walker or w/c? No  Grab bars in the bathroom? No  Shower chair or bench in shower? No  Elevated toilet seat or a handicapped toilet? No          10/03/2022    9:18 AM 10/01/2021    4:36 PM  6CIT Screen  What Year? 0 points 0 points  What month? 0 points 0 points  What time? 0 points 0 points  Count back from 20 0 points 0 points  Months in reverse 0 points 0 points  Repeat phrase 0 points 0 points  Total Score 0 points 0 points    Immunizations Immunization History  Administered Date(s) Administered   Pneumococcal Conjugate-13 11/06/2020    TDAP status: Due, Education has been provided regarding the importance of this vaccine. Advised may receive this vaccine at local pharmacy or Health Dept. Aware to provide a copy of the vaccination record if obtained from local pharmacy or Health Dept. Verbalized acceptance and understanding.  Flu Vaccine status: Up to date  Pneumococcal vaccine status: Up to date  Covid-19 vaccine status: Completed vaccines  Qualifies for Shingles Vaccine? Yes   Zostavax completed No   Shingrix  Completed?: No.    Education has been provided regarding the importance of this vaccine. Patient has been advised to call insurance company to determine out of pocket expense if they have not yet received this vaccine. Advised may also receive vaccine at local pharmacy or Health Dept. Verbalized acceptance and understanding.  Screening Tests Health Maintenance  Topic Date Due   OPHTHALMOLOGY EXAM  08/26/2022   Lung Cancer Screening  10/17/2022   COVID-19 Vaccine (1) 10/06/2022 (Originally 01/09/1957)   INFLUENZA VACCINE  11/24/2022 (Originally 03/26/2022)   Zoster Vaccines- Shingrix (1 of 2) 12/20/2022 (Originally 07/11/2005)   Pneumonia Vaccine 28+ Years old (2 of 2 - PPSV23 or PCV20) 09/21/2023 (Originally 01/01/2021)   COLONOSCOPY (Pts 45-2yrs Insurance coverage will need to be confirmed)  09/21/2023 (Originally 07/11/2000)   Diabetic kidney evaluation - Urine ACR  12/21/2022   FOOT EXAM  12/21/2022   HEMOGLOBIN A1C  03/21/2023   Diabetic kidney evaluation - eGFR measurement  09/21/2023  Medicare Annual Wellness (AWV)  10/04/2023   Hepatitis C Screening  Completed   HPV VACCINES  Aged Out   DTaP/Tdap/Td  Discontinued    Health Maintenance  Health Maintenance Due  Topic Date Due   OPHTHALMOLOGY EXAM  08/26/2022   Lung Cancer Screening  10/17/2022    Colorectal cancer screening: Referral to GI placed declined . Pt aware the office will call re: appt.  Lung Cancer Screening: (Low Dose CT Chest recommended if Age 51-80 years, 30 pack-year currently smoking OR have quit w/in 15years.) does not qualify.   Lung Cancer Screening Referral: n/a  Additional Screening:  Hepatitis C Screening: does not qualify; Completed 05/11/2019  Vision Screening: Recommended annual ophthalmology exams for early detection of glaucoma and other disorders of the eye. Is the patient up to date with their annual eye exam?  No  Who is the provider or what is the name of the office in which the patient  attends annual eye exams? None , referral 10/03/2022 If pt is not established with a provider, would they like to be referred to a provider to establish care? No .   Dental Screening: Recommended annual dental exams for proper oral hygiene  Community Resource Referral / Chronic Care Management: CRR required this visit?  No   CCM required this visit?  No      Plan:     I have personally reviewed and noted the following in the patient's chart:   Medical and social history Use of alcohol, tobacco or illicit drugs  Current medications and supplements including opioid prescriptions. Patient is not currently taking opioid prescriptions. Functional ability and status Nutritional status Physical activity Advanced directives List of other physicians Hospitalizations, surgeries, and ER visits in previous 12 months Vitals Screenings to include cognitive, depression, and falls Referrals and appointments  In addition, I have reviewed and discussed with patient certain preventive protocols, quality metrics, and best practice recommendations. A written personalized care plan for preventive services as well as general preventive health recommendations were provided to patient.     Daphane Shepherd, LPN   01/28/353   Nurse Notes: due TDAP vaccine , will discuss Colonoscopy with PCP

## 2022-10-17 ENCOUNTER — Ambulatory Visit (INDEPENDENT_AMBULATORY_CARE_PROVIDER_SITE_OTHER): Payer: Medicare HMO

## 2022-10-17 DIAGNOSIS — Z87891 Personal history of nicotine dependence: Secondary | ICD-10-CM

## 2022-10-17 DIAGNOSIS — F17210 Nicotine dependence, cigarettes, uncomplicated: Secondary | ICD-10-CM

## 2022-10-21 ENCOUNTER — Other Ambulatory Visit: Payer: Self-pay | Admitting: Family Medicine

## 2022-10-21 DIAGNOSIS — N2889 Other specified disorders of kidney and ureter: Secondary | ICD-10-CM

## 2022-10-24 ENCOUNTER — Telehealth: Payer: Self-pay | Admitting: Acute Care

## 2022-10-24 DIAGNOSIS — Z87891 Personal history of nicotine dependence: Secondary | ICD-10-CM

## 2022-10-24 DIAGNOSIS — F17210 Nicotine dependence, cigarettes, uncomplicated: Secondary | ICD-10-CM

## 2022-10-24 NOTE — Telephone Encounter (Signed)
Review of chart shows PCP has ordered MR Abdomen and has been schedule 11/06/22.  Order placed for annual LDCT

## 2022-10-24 NOTE — Telephone Encounter (Signed)
I have attempted to call the patient with the results of their  Low Dose CT Chest Lung cancer screening scan. There was no answer. I have left a HIPPA compliant VM requesting the patient call the office for the scan results. I included the office contact information in the message. We will await his return call. If no return call we will continue to call until patient is contacted.   From Lung cancer perspective, LR 2, 12 month follow up.  Incidental findings include    >>  a renal lesion/ cyst that has increased I size since his last scan 12 months ago. It needs to be better evaluated non-urgently with additional imaging. Radiology recommend  an abdominal MRI with and without IV gadolinium is recommended in the near future to exclude underlying neoplasm.  CAD, aortic atherosclerosis , Left main and 3 vessel CAD. He has had a CABG in the past including LIMA to the LAD. and is followed by cardiology.  We will need to let PCP know regarding the renal finding once we get in touch with the patient, so they can order follow up imaging if they feel it is clinically indicated.    Thanks so much

## 2022-10-29 ENCOUNTER — Telehealth: Payer: Self-pay | Admitting: Acute Care

## 2022-10-29 NOTE — Telephone Encounter (Signed)
I have attempted to call the patient with the results of their  Low Dose CT Chest Lung cancer screening scan. There was no answer. I have left a HIPPA compliant VM requesting the patient call the office for the scan results. I included the office contact information in the message. We will await his return call. If no return call we will continue to call until patient is contacted.    I tried calling him 2/29. There is a telephone note with the findings. He never called back, but his PCP saw the message, and must have ordered the scan. It is scheduled for 3/13. I just tried calling again and left another message. He did not answer. If he calls back everything he needs to know is in the message, but if you would prefer I call him, we can keep trying. Thanks   From Lung cancer perspective, LR 2, 12 month follow up.   Incidental findings include     >>  a renal lesion/ cyst that has increased I size since his last scan 12 months ago. It needs to be better evaluated non-urgently with additional imaging. Radiology recommend  an abdominal MRI with and without IV gadolinium is recommended in the near future to exclude underlying neoplasm.   CAD, aortic atherosclerosis , Left main and 3 vessel CAD. He has had a CABG in the past including LIMA to the LAD. and is followed by cardiology.   We will need to let PCP know regarding the renal finding once we get in touch with the patient, so they can order follow up imaging if they feel it is clinically indicated.

## 2022-10-29 NOTE — Telephone Encounter (Signed)
Letter mailed to home today regarding LDCT results with RADS2 findings.  Notation in letter to state we attempted to reach patient by phone and noted the PCP is following the renal lesion with MR Abdomen and for the patient to continue follow up PCP for further recommendations and care.  Order placed for annual LDCT.

## 2022-11-01 NOTE — Telephone Encounter (Signed)
Returned call to patient.  No answer.  Left VM and call back info

## 2022-11-06 ENCOUNTER — Ambulatory Visit
Admission: RE | Admit: 2022-11-06 | Discharge: 2022-11-06 | Disposition: A | Discharge status: Home / Self Care | Payer: Medicare HMO | Source: Ambulatory Visit | Attending: Family Medicine | Admitting: Family Medicine

## 2022-11-06 DIAGNOSIS — I700 Atherosclerosis of aorta: Secondary | ICD-10-CM | POA: Diagnosis not present

## 2022-11-06 DIAGNOSIS — N281 Cyst of kidney, acquired: Secondary | ICD-10-CM | POA: Diagnosis not present

## 2022-11-06 DIAGNOSIS — N2889 Other specified disorders of kidney and ureter: Secondary | ICD-10-CM

## 2022-11-06 MED ORDER — GADOPICLENOL 0.5 MMOL/ML IV SOLN
10.0000 mL | Freq: Once | INTRAVENOUS | Status: AC | PRN
  Administered 2022-11-06: 10 mL via INTRAVENOUS

## 2022-11-28 LAB — HM DIABETES EYE EXAM

## 2022-12-27 ENCOUNTER — Ambulatory Visit: Payer: Medicare HMO | Admitting: Family Medicine

## 2023-01-06 DIAGNOSIS — T1501XA Foreign body in cornea, right eye, initial encounter: Secondary | ICD-10-CM | POA: Diagnosis not present

## 2023-02-18 ENCOUNTER — Ambulatory Visit: Payer: Medicare HMO | Admitting: Family Medicine

## 2023-04-11 ENCOUNTER — Encounter: Payer: Self-pay | Admitting: Family Medicine

## 2023-04-11 ENCOUNTER — Ambulatory Visit (INDEPENDENT_AMBULATORY_CARE_PROVIDER_SITE_OTHER): Payer: Medicare HMO | Admitting: Family Medicine

## 2023-04-11 VITALS — BP 139/70 | HR 55 | Temp 98.3°F | Ht 66.0 in | Wt 231.6 lb

## 2023-04-11 DIAGNOSIS — I700 Atherosclerosis of aorta: Secondary | ICD-10-CM | POA: Diagnosis not present

## 2023-04-11 DIAGNOSIS — E118 Type 2 diabetes mellitus with unspecified complications: Secondary | ICD-10-CM | POA: Diagnosis not present

## 2023-04-11 DIAGNOSIS — E785 Hyperlipidemia, unspecified: Secondary | ICD-10-CM | POA: Diagnosis not present

## 2023-04-11 DIAGNOSIS — E1169 Type 2 diabetes mellitus with other specified complication: Secondary | ICD-10-CM | POA: Diagnosis not present

## 2023-04-11 DIAGNOSIS — I25700 Atherosclerosis of coronary artery bypass graft(s), unspecified, with unstable angina pectoris: Secondary | ICD-10-CM

## 2023-04-11 LAB — BAYER DCA HB A1C WAIVED: HB A1C (BAYER DCA - WAIVED): 6.6 % — ABNORMAL HIGH (ref 4.8–5.6)

## 2023-04-11 NOTE — Progress Notes (Signed)
Subjective: CC:DM PCP: Raliegh Ip, DO WUJ:[id number] Yearta is a 67 y.o. male presenting to clinic today for:  1. Type 2 Diabetes with hyperlipidemia, CAD and aortic atherosclerosis:  He has been monitoring sugars at home and they are typically running 100s to 120s.  He admits that he was on vacation recently and did not follow a strict diet.  He occasionally drinks beers but usually nothing during the week.  He stopped his Jardiance due to flank pain.  He is not having any dysuria, hematuria or other genital discoloration.  He is compliant with Lipitor, baby aspirin.  Diabetes Health Maintenance Due  Topic Date Due   FOOT EXAM  12/21/2022   HEMOGLOBIN A1C  03/21/2023   OPHTHALMOLOGY EXAM  11/28/2023    Last A1c:  Lab Results  Component Value Date   HGBA1C 6.5 (H) 09/20/2022    ROS: Denies dizziness, LOC, polyuria, polydipsia, unintended weight loss/gain, foot ulcerations, numbness or tingling in extremities, shortness of breath or chest pain.   ROS: Per HPI  Allergies  Allergen Reactions   Sulfa Antibiotics Anaphylaxis    hives swelling   Zocor [Simvastatin] Other (See Comments)    MYOPATHY   Past Medical History:  Diagnosis Date   AKI (acute kidney injury) (HCC) 07/14/2016   CAD (coronary artery disease)    History of myocardial infarction    Hx of CABG    Hyperlipemia    MI (myocardial infarction) (HCC)    Tobacco abuse    Traumatic closed fracture of distal clavicle with minimal displacement, right, initial encounter 03/22/2017    Current Outpatient Medications:    aspirin EC 81 MG tablet, Take 1 tablet (81 mg total) by mouth daily., Disp: 90 tablet, Rfl: 3   atorvastatin (LIPITOR) 40 MG tablet, TAKE 1 TABLET EVERY DAY  AT  6PM, Disp: 90 tablet, Rfl: 3   azelastine (ASTELIN) 0.1 % nasal spray, Place 1 spray into both nostrils 2 (two) times daily., Disp: 30 mL, Rfl: 12   Blood Glucose Monitoring Suppl DEVI, UAD to check sugar daily.  May substitute to any  manufacturer covered by patient's insurance. E11.9May substitute to any manufacturer covered by patient's insurance., Disp: 1 each, Rfl: 0   GARLIC PO, Take 1 tablet by mouth daily., Disp: , Rfl:    Glucose Blood (BLOOD GLUCOSE TEST STRIPS) STRP, UAD to check sugar daily.  May substitute to any manufacturer covered by patient's insurance. E11.9, Disp: 100 strip, Rfl: PRN   JARDIANCE 10 MG TABS tablet, Take 1 tablet (10 mg total) by mouth every morning., Disp: 90 tablet, Rfl: 3   Lancet Device MISC, UAD to check sugar daily.  May substitute to any manufacturer covered by patient's insurance. E11.9 May substitute to any manufacturer covered by patient's insurance., Disp: 100 each, Rfl: PRN   Tadalafil 2.5 MG TABS, Take 1 tablet (2.5 mg total) by mouth daily., Disp: 90 tablet, Rfl: 3   traZODone (DESYREL) 50 MG tablet, Take 0.5-1 tablets (25-50 mg total) by mouth at bedtime as needed for sleep., Disp: 30 tablet, Rfl: 3   TURMERIC PO, Take 1,000 mg by mouth daily., Disp: , Rfl:    VITAMIN E PO, Take 2 capsules by mouth daily., Disp: , Rfl:  Social History   Socioeconomic History   Marital status: Married    Spouse name: Not on file   Number of children: 4   Years of education: Not on file   Highest education level: Not on file  Occupational History  Occupation: TRUCK DRIVER    Employer: [employer]  Tobacco Use   Smoking status: Every Day    Current packs/day: 1.00    Average packs/day: 1 pack/day for 21.0 years (21.0 ttl pk-yrs)    Types: Cigarettes   Smokeless tobacco: Never  Vaping Use   Vaping status: Never Used  Substance and Sexual Activity   Alcohol use: Not Currently   Drug use: Not Currently   Sexual activity: Not on file  Other Topics Concern   Not on file  Social History Narrative   ** Merged History Encounter ** ** Data from: 12/28/16 [facility].  He has seven grandchildren.  ** Data from: 03/23/17 Enc Dept: [facility]   Lives with  wife   Self-employed truck driver   Social Determinants of Health   Financial Resource Strain: Low Risk  (10/03/2022)   Overall Financial Resource Strain (CARDIA)    Difficulty of Paying Living Expenses: Not hard at all  Food Insecurity: No Food Insecurity (10/03/2022)   Hunger Vital Sign    Worried About Running Out of Food in the Last Year: Never true    Ran Out of Food in the Last Year: Never true  Transportation Needs: No Transportation Needs (10/03/2022)   PRAPARE - Administrator, Civil Service (Medical): No    Lack of Transportation (Non-Medical): No  Physical Activity: Sufficiently Active (10/03/2022)   Exercise Vital Sign    Days of Exercise per Week: 5 days    Minutes of Exercise per Session: 30 min  Stress: No Stress Concern Present (10/03/2022)   [employer] of Occupational Health - Occupational Stress Questionnaire    Feeling of Stress : Not at all  Social Connections: Moderately Integrated (10/03/2022)   Social Connection and Isolation Panel [NHANES]    Frequency of Communication with Friends and Family: More than three times a week    Frequency of Social Gatherings with Friends and Family: More than three times a week    Attends Religious Services: Never    Database administrator or Organizations: Yes    Attends Engineer, structural: More than 4 times per year    Marital Status: Married  Catering manager Violence: Not At Risk (10/03/2022)   Humiliation, Afraid, Rape, and Kick questionnaire    Fear of Current or Ex-Partner: No    Emotionally Abused: No    Physically Abused: No    Sexually Abused: No   Family History  Problem Relation Age of Onset   Diabetes Mother    CAD Father 31       Died   CAD Brother     Objective: Office vital signs reviewed. BP 139/70   Pulse (!) 55   Temp 98.3 F (36.8 C)   Ht 5\' 6"  (1.676 m)   Wt 231 lb 9.6 oz (105.1 kg)   SpO2 93%   BMI 37.38 kg/m   Physical Examination:  General: Awake, alert, well  nourished, obese.  No acute distress HEENT: sclera white, MMM Cardio: regular rate and rhythm, S1S2 heard, no murmurs appreciated Pulm: clear to auscultation bilaterally, no wheezes, rhonchi or rales; normal work of breathing on room air  Diabetic Foot Exam - Simple   Simple Foot Form Diabetic Foot exam was performed with the following findings: Yes 04/11/2023  2:50 PM  Visual Inspection No deformities, no ulcerations, no other skin breakdown bilaterally: Yes Sensation Testing Intact to touch and monofilament testing bilaterally: Yes Pulse Check Posterior  Tibialis and Dorsalis pulse intact bilaterally: Yes Comments      Assessment/ Plan: 67 y.o. male   Type 2 diabetes mellitus with complication, without long-term current use of insulin (HCC) - Plan: Microalbumin / creatinine urine ratio, Bayer DCA Hb A1c Waived  Hyperlipidemia associated with type 2 diabetes mellitus (HCC) - Plan: LDL Cholesterol, Direct  Aortic atherosclerosis (HCC)  Coronary artery disease involving coronary bypass graft of native heart with unstable angina pectoris (HCC)  Check A1c, urine microalbumin.  Off of Jardiance due to intolerance.  May need to revisit alternative therapies if A1c above 7.  Encouraged ongoing lifestyle modification to promote good health.  Direct LDL collected today given nonfasting status.  Continue statin to reduce risk of cardiovascular event in the setting of known CAD and aortic atherosclerosis  Nykolas Bacallao Hulen Skains, DO [facility] [phone]

## 2023-04-12 LAB — MICROALBUMIN / CREATININE URINE RATIO
Creatinine, Urine: 193.5 mg/dL
Microalb/Creat Ratio: 3 mg/g{creat} (ref 0–29)
Microalbumin, Urine: 5.3 ug/mL

## 2023-04-12 LAB — LDL CHOLESTEROL, DIRECT: LDL Direct: 134 mg/dL — ABNORMAL HIGH (ref 0–99)

## 2023-06-05 ENCOUNTER — Other Ambulatory Visit: Payer: Self-pay

## 2023-06-05 ENCOUNTER — Emergency Department (HOSPITAL_COMMUNITY)
Admission: EM | Admit: 2023-06-05 | Discharge: 2023-06-05 | Discharge status: Against Medical Advice | Payer: Medicare HMO | Attending: Emergency Medicine | Admitting: Emergency Medicine

## 2023-06-05 ENCOUNTER — Emergency Department (HOSPITAL_COMMUNITY): Payer: Medicare HMO

## 2023-06-05 ENCOUNTER — Encounter (HOSPITAL_COMMUNITY): Payer: Self-pay

## 2023-06-05 DIAGNOSIS — I10 Essential (primary) hypertension: Secondary | ICD-10-CM | POA: Diagnosis not present

## 2023-06-05 DIAGNOSIS — I959 Hypotension, unspecified: Secondary | ICD-10-CM | POA: Diagnosis not present

## 2023-06-05 DIAGNOSIS — Z5321 Procedure and treatment not carried out due to patient leaving prior to being seen by health care provider: Secondary | ICD-10-CM | POA: Insufficient documentation

## 2023-06-05 DIAGNOSIS — R55 Syncope and collapse: Secondary | ICD-10-CM | POA: Insufficient documentation

## 2023-06-05 DIAGNOSIS — S61213A Laceration without foreign body of left middle finger without damage to nail, initial encounter: Secondary | ICD-10-CM | POA: Insufficient documentation

## 2023-06-05 DIAGNOSIS — X58XXXA Exposure to other specified factors, initial encounter: Secondary | ICD-10-CM | POA: Diagnosis not present

## 2023-06-05 LAB — CBC
HCT: 47.6 % (ref 39.0–52.0)
Hemoglobin: 15.9 g/dL (ref 13.0–17.0)
MCH: 33 pg (ref 26.0–34.0)
MCHC: 33.4 g/dL (ref 30.0–36.0)
MCV: 98.8 fL (ref 80.0–100.0)
Platelets: 187 10*3/uL (ref 150–400)
RBC: 4.82 MIL/uL (ref 4.22–5.81)
RDW: 12.2 % (ref 11.5–15.5)
WBC: 14.5 10*3/uL — ABNORMAL HIGH (ref 4.0–10.5)
nRBC: 0 % (ref 0.0–0.2)

## 2023-06-05 LAB — BASIC METABOLIC PANEL
Anion gap: 11 (ref 5–15)
BUN: 20 mg/dL (ref 8–23)
CO2: 26 mmol/L (ref 22–32)
Calcium: 9.8 mg/dL (ref 8.9–10.3)
Chloride: 101 mmol/L (ref 98–111)
Creatinine, Ser: 1.24 mg/dL (ref 0.61–1.24)
GFR, Estimated: >60 mL/min (ref >60)
Glucose, Bld: 129 mg/dL — ABNORMAL HIGH (ref 70–99)
Potassium: 4.4 mmol/L (ref 3.5–5.1)
Sodium: 138 mmol/L (ref 135–145)

## 2023-06-05 LAB — CBG MONITORING, ED: Glucose-Capillary: 165 mg/dL — ABNORMAL HIGH (ref 70–99)

## 2023-06-05 NOTE — ED Notes (Signed)
Pt left lobby at 1340.

## 2023-06-05 NOTE — ED Triage Notes (Signed)
Pt bib ems from work, pt cut his finger, saw the blood and passed out. Pt hit his head but does not take a blood thinner. Pt had bruise on left side of forehead.

## 2023-06-05 NOTE — ED Provider Triage Note (Signed)
Emergency Medicine Provider Triage Evaluation Note  Fields Oros , a 67 y.o. male  was evaluated in triage.  Pt complains of laceration to his left middle finger.  Patient reports after rinsing his finger he felt dizzy, weak, and nauseated.  Patient had syncopal episode shortly after and woke up on the floor.  He reports currently still feeling weak.    Review of Systems  Positive: As above Negative: As above  Physical Exam  BP (!) 160/68   Pulse (!) 56   Temp 98.2 F (36.8 C) (Oral)   Resp 16   Ht 5\' 6"  (1.676 m)   Wt 102.5 kg   SpO2 99%   BMI 36.48 kg/m  Gen:   Awake, no distress   Resp:  Normal effort  MSK:   Moves extremities without difficulty  Other:    Medical Decision Making  Medically screening exam initiated at 10:34 AM.  Appropriate orders placed.  Bartow Zylstra was informed that the remainder of the evaluation will be completed by another provider, this initial triage assessment does not replace that evaluation, and the importance of remaining in the ED until their evaluation is complete.     Melton Alar R, PA-C 06/05/23 1036

## 2023-06-18 ENCOUNTER — Other Ambulatory Visit: Payer: Self-pay | Admitting: Family Medicine

## 2023-06-18 DIAGNOSIS — N401 Enlarged prostate with lower urinary tract symptoms: Secondary | ICD-10-CM

## 2023-08-14 ENCOUNTER — Telehealth: Payer: Self-pay | Admitting: Family Medicine

## 2023-08-14 NOTE — Telephone Encounter (Signed)
Lmtcb.

## 2023-08-14 NOTE — Telephone Encounter (Unsigned)
Copied from CRM [id number]. Topic: Appointments - Appointment Cancel/Reschedule >> Aug 14, 2023  8:10 AM Fuller Canada P wrote: Patient/patient representative is calling to cancel or reschedule an appointment. Please Reach out to patient to Reschedule appointment.

## 2023-08-15 ENCOUNTER — Ambulatory Visit: Payer: Medicare HMO | Admitting: Family Medicine

## 2023-08-28 NOTE — Telephone Encounter (Signed)
No return call will close encounter. 

## 2023-10-17 ENCOUNTER — Encounter: Payer: Self-pay | Admitting: Family Medicine

## 2023-10-17 ENCOUNTER — Ambulatory Visit: Payer: Medicare HMO | Admitting: Family Medicine

## 2023-10-17 VITALS — BP 128/76 | HR 60 | Temp 98.7°F | Ht 66.0 in | Wt 239.0 lb

## 2023-10-17 DIAGNOSIS — I25700 Atherosclerosis of coronary artery bypass graft(s), unspecified, with unstable angina pectoris: Secondary | ICD-10-CM | POA: Diagnosis not present

## 2023-10-17 DIAGNOSIS — E785 Hyperlipidemia, unspecified: Secondary | ICD-10-CM

## 2023-10-17 DIAGNOSIS — Z6837 Body mass index (BMI) 37.0-37.9, adult: Secondary | ICD-10-CM | POA: Diagnosis not present

## 2023-10-17 DIAGNOSIS — Z23 Encounter for immunization: Secondary | ICD-10-CM

## 2023-10-17 DIAGNOSIS — Z800 Family history of malignant neoplasm of digestive organs: Secondary | ICD-10-CM

## 2023-10-17 DIAGNOSIS — F5101 Primary insomnia: Secondary | ICD-10-CM | POA: Diagnosis not present

## 2023-10-17 DIAGNOSIS — N401 Enlarged prostate with lower urinary tract symptoms: Secondary | ICD-10-CM | POA: Diagnosis not present

## 2023-10-17 DIAGNOSIS — R351 Nocturia: Secondary | ICD-10-CM

## 2023-10-17 DIAGNOSIS — E1169 Type 2 diabetes mellitus with other specified complication: Secondary | ICD-10-CM

## 2023-10-17 DIAGNOSIS — I700 Atherosclerosis of aorta: Secondary | ICD-10-CM

## 2023-10-17 DIAGNOSIS — E118 Type 2 diabetes mellitus with unspecified complications: Secondary | ICD-10-CM | POA: Diagnosis not present

## 2023-10-17 DIAGNOSIS — Z1211 Encounter for screening for malignant neoplasm of colon: Secondary | ICD-10-CM

## 2023-10-17 LAB — BAYER DCA HB A1C WAIVED: HB A1C (BAYER DCA - WAIVED): 6.2 % — ABNORMAL HIGH (ref 4.8–5.6)

## 2023-10-17 MED ORDER — TRAZODONE HCL 50 MG PO TABS: 25.0000 mg | ORAL_TABLET | Freq: Every evening | ORAL | 3 refills | Status: AC | PRN

## 2023-10-17 MED ORDER — TADALAFIL 2.5 MG PO TABS
1.0000 | ORAL_TABLET | Freq: Every day | ORAL | 3 refills | Status: DC
Start: 2023-10-17 — End: 2024-06-18

## 2023-10-17 MED ORDER — ATORVASTATIN CALCIUM 40 MG PO TABS
ORAL_TABLET | ORAL | 3 refills | Status: DC
Start: 2023-10-17 — End: 2024-06-18

## 2023-10-17 NOTE — Progress Notes (Signed)
Subjective: CC:DM PCP: Raliegh Ip, DO [id number] Fawcett is a 68 y.o. male presenting to clinic today for:  1. Type 2 Diabetes with hyperlipidemia associated with CAD:  Patient is compliant with Lipitor, baby aspirin.  He is had diet-controlled diabetes but admits that he has been eating more cakes over the last couple of months.  His wife bakes these and he is really been enjoying the holidays.  He has gained 5 pounds and admits that he is not as physically active as he normally is.  He has glucose testing supplies but is not checking it regularly Diabetes Health Maintenance Due  Topic Date Due   HEMOGLOBIN A1C  10/12/2023   OPHTHALMOLOGY EXAM  11/28/2023   FOOT EXAM  04/10/2024    Last A1c:  Lab Results  Component Value Date   HGBA1C 6.6 (H) 04/11/2023    ROS: No chest pain, shortness of breath, polydipsia, polyuria, visual disturbance.  Has lung cancer screening coming up soon.  Continues to smoke regularly.  Declined shingles and influenza vaccination but is willing to take tetanus and pneumonia   ROS: Per HPI  Allergies  Allergen Reactions   Sulfa Antibiotics Anaphylaxis    hives swelling   Jardiance [Empagliflozin]     Caused flank pain   Zocor [Simvastatin] Other (See Comments)    MYOPATHY   Past Medical History:  Diagnosis Date   AKI (acute kidney injury) (HCC) 07/14/2016   CAD (coronary artery disease)    History of myocardial infarction    Hx of CABG    Hyperlipemia    MI (myocardial infarction) (HCC)    Tobacco abuse    Traumatic closed fracture of distal clavicle with minimal displacement, right, initial encounter 03/22/2017    Current Outpatient Medications:    aspirin EC 81 MG tablet, Take 1 tablet (81 mg total) by mouth daily., Disp: 90 tablet, Rfl: 3   atorvastatin (LIPITOR) 40 MG tablet, TAKE 1 TABLET EVERY DAY  AT  6PM, Disp: 90 tablet, Rfl: 3   azelastine (ASTELIN) 0.1 % nasal spray, Place 1 spray into both nostrils 2 (two) times daily.,  Disp: 30 mL, Rfl: 12   Blood Glucose Monitoring Suppl DEVI, UAD to check sugar daily.  May substitute to any manufacturer covered by patient's insurance. E11.9May substitute to any manufacturer covered by patient's insurance., Disp: 1 each, Rfl: 0   GARLIC PO, Take 1 tablet by mouth daily., Disp: , Rfl:    Glucose Blood (BLOOD GLUCOSE TEST STRIPS) STRP, UAD to check sugar daily.  May substitute to any manufacturer covered by patient's insurance. E11.9, Disp: 100 strip, Rfl: PRN   Lancet Device MISC, UAD to check sugar daily.  May substitute to any manufacturer covered by patient's insurance. E11.9 May substitute to any manufacturer covered by patient's insurance., Disp: 100 each, Rfl: PRN   Tadalafil 2.5 MG TABS, Take 1 tablet (2.5 mg total) by mouth daily., Disp: 90 tablet, Rfl: 1   traZODone (DESYREL) 50 MG tablet, Take 0.5-1 tablets (25-50 mg total) by mouth at bedtime as needed for sleep., Disp: 30 tablet, Rfl: 3   TURMERIC PO, Take 1,000 mg by mouth daily., Disp: , Rfl:    VITAMIN E PO, Take 2 capsules by mouth daily., Disp: , Rfl:  Social History   Socioeconomic History   Marital status: Married    Spouse name: Not on file   Number of children: 4   Years of education: Not on file   Highest education level: Not on  file  Occupational History   Occupation: TRUCK DRIVER    Employer:  [employer]  Tobacco Use   Smoking status: Every Day    Current packs/day: 1.00    Average packs/day: 1 pack/day for 21.0 years (21.0 ttl pk-yrs)    Types: Cigarettes   Smokeless tobacco: Never  Vaping Use   Vaping status: Never Used  Substance and Sexual Activity   Alcohol use: Not Currently   Drug use: Not Currently   Sexual activity: Not on file  Other Topics Concern   Not on file  Social History Narrative   ** Merged History Encounter ** ** Data from: 12/28/16 [facility].  He has seven grandchildren.  ** Data from: 03/23/17 Enc Dept: [facility]   Lives  with wife   Self-employed truck driver   Social Drivers of Health   Financial Resource Strain: Low Risk  (10/03/2022)   Overall Financial Resource Strain (CARDIA)    Difficulty of Paying Living Expenses: Not hard at all  Food Insecurity: No Food Insecurity (10/03/2022)   Hunger Vital Sign    Worried About Running Out of Food in the Last Year: Never true    Ran Out of Food in the Last Year: Never true  Transportation Needs: No Transportation Needs (10/03/2022)   PRAPARE - Administrator, Civil Service (Medical): No    Lack of Transportation (Non-Medical): No  Physical Activity: Sufficiently Active (10/03/2022)   Exercise Vital Sign    Days of Exercise per Week: 5 days    Minutes of Exercise per Session: 30 min  Stress: No Stress Concern Present (10/03/2022)   [employer] of Occupational Health - Occupational Stress Questionnaire    Feeling of Stress : Not at all  Social Connections: Moderately Integrated (10/03/2022)   Social Connection and Isolation Panel [NHANES]    Frequency of Communication with Friends and Family: More than three times a week    Frequency of Social Gatherings with Friends and Family: More than three times a week    Attends Religious Services: Never    Database administrator or Organizations: Yes    Attends Engineer, structural: More than 4 times per year    Marital Status: Married  Catering manager Violence: Not At Risk (10/03/2022)   Humiliation, Afraid, Rape, and Kick questionnaire    Fear of Current or Ex-Partner: No    Emotionally Abused: No    Physically Abused: No    Sexually Abused: No   Family History  Problem Relation Age of Onset   Diabetes Mother    CAD Father 25       Died   CAD Brother     Objective: Office vital signs reviewed. BP 128/76   Pulse 60   Temp 98.7 F (37.1 C)   Ht 5\' 6"  (1.676 m)   Wt 239 lb (108.4 kg)   SpO2 93%   BMI 38.58 kg/m   Physical Examination:  General: Awake, alert, well nourished, No  acute distress HEENT: Sclera white.  Moist mucous membranes.  Has tobacco staining on his beard Cardio: regular rate and rhythm, S1S2 heard, no murmurs appreciated Pulm: clear to auscultation bilaterally, no wheezes, rhonchi or rales; normal work of breathing on room air GI: Abdomen protuberant  Assessment/ Plan: 68 y.o. male   Type 2 diabetes mellitus with complication, without long-term current use of insulin (HCC) - Plan: Bayer DCA Hb A1c Waived  Hyperlipidemia associated with type 2 diabetes mellitus (HCC) -  Plan: atorvastatin (LIPITOR) 40 MG tablet  Coronary artery disease involving coronary bypass graft of native heart with unstable angina pectoris (HCC) - Plan: atorvastatin (LIPITOR) 40 MG tablet  Aortic atherosclerosis (HCC) - Plan: atorvastatin (LIPITOR) 40 MG tablet  Morbid obesity (HCC)  BMI 37.0-37.9, adult  Primary insomnia - Plan: traZODone (DESYREL) 50 MG tablet  Colon cancer screening - Plan: Ambulatory referral to Gastroenterology  Family history of colon cancer - Plan: Ambulatory referral to Gastroenterology  Benign prostatic hyperplasia with nocturia - Plan: Tadalafil 2.5 MG TABS  Despite noncompliance recently with diet his A1c is 6.2.  Encouraged physical exercise, diet modification  Pneumococcal and tetanus shots were administered.  He declined shingles and influenza  Lipitor renewed for prevention of secondary cardiac events  Again weight loss and diet modification reinforced  Trazodone renewed for sparing use  Referral to gastroenterology for colon cancer screening.  Family history in his paternal grandfather of colon cancer.  He has no active symptoms but is an every day smoker  Cialis renewed for BPH with nocturia.  This is working well for him  Raliegh Ip, DO [facility] [phone]

## 2023-10-17 NOTE — Addendum Note (Signed)
Addended by: Waynette Buttery on: 10/17/2023 02:35 PM   Modules accepted: Orders

## 2023-10-20 ENCOUNTER — Ambulatory Visit: Payer: Medicare HMO

## 2023-10-20 DIAGNOSIS — I700 Atherosclerosis of aorta: Secondary | ICD-10-CM | POA: Diagnosis not present

## 2023-10-20 DIAGNOSIS — F17210 Nicotine dependence, cigarettes, uncomplicated: Secondary | ICD-10-CM

## 2023-10-20 DIAGNOSIS — Z122 Encounter for screening for malignant neoplasm of respiratory organs: Secondary | ICD-10-CM | POA: Diagnosis not present

## 2023-10-20 DIAGNOSIS — Z87891 Personal history of nicotine dependence: Secondary | ICD-10-CM

## 2023-10-20 DIAGNOSIS — J439 Emphysema, unspecified: Secondary | ICD-10-CM | POA: Diagnosis not present

## 2023-11-10 ENCOUNTER — Other Ambulatory Visit: Payer: Self-pay

## 2023-11-10 DIAGNOSIS — Z87891 Personal history of nicotine dependence: Secondary | ICD-10-CM

## 2023-11-10 DIAGNOSIS — F17210 Nicotine dependence, cigarettes, uncomplicated: Secondary | ICD-10-CM

## 2024-01-21 ENCOUNTER — Telehealth: Payer: Self-pay | Admitting: Family Medicine

## 2024-01-21 NOTE — Telephone Encounter (Signed)
 Copied from CRM [id number]. Topic: General - Billing Inquiry >> Jan 21, 2024  2:39 PM Zipporah Him wrote: Reason for CRM: Patient states his imaging from Feb 24th was not authorized and sent to [facility] He got a bill in the mail and he talked to [facility] and states they told him if they get the information needed for the imaging sent over they can pay it.

## 2024-01-21 NOTE — Telephone Encounter (Signed)
 Our Office did not Order CT Lung Cancer Screening - This was ordered by Pulmonary Office - Patient needs to contact Pulmonary Office in reference to Authorization of CT.

## 2024-01-22 NOTE — Telephone Encounter (Signed)
 Spoke with pt he is aware he needs to call [facility].

## 2024-01-23 ENCOUNTER — Telehealth: Payer: Self-pay | Admitting: *Deleted

## 2024-01-23 NOTE — Telephone Encounter (Signed)
 PT states we need to send [facility] for the CT he had done. He got a bill, called [employer], and they said if we send an auth they would pay it. His # is [phone]

## 2024-01-27 ENCOUNTER — Ambulatory Visit

## 2024-01-27 ENCOUNTER — Ambulatory Visit (INDEPENDENT_AMBULATORY_CARE_PROVIDER_SITE_OTHER)

## 2024-01-27 DIAGNOSIS — E118 Type 2 diabetes mellitus with unspecified complications: Secondary | ICD-10-CM | POA: Diagnosis not present

## 2024-01-27 LAB — HM DIABETES EYE EXAM

## 2024-01-27 NOTE — Progress Notes (Signed)
 Randall Hall arrived 01/27/2024 and has given verbal consent to obtain images and complete their overdue diabetic retinal screening.  The images have been sent to an ophthalmologist or optometrist for review and interpretation.  Results will be sent back to [name], DO for review.  Patient has been informed they will be contacted when we receive the results via telephone or MyChart

## 2024-03-02 ENCOUNTER — Ambulatory Visit: Payer: Self-pay | Admitting: Family Medicine

## 2024-03-23 ENCOUNTER — Ambulatory Visit

## 2024-03-23 VITALS — BP 128/76 | Ht 66.0 in | Wt 239.0 lb

## 2024-03-23 DIAGNOSIS — Z0000 Encounter for general adult medical examination without abnormal findings: Secondary | ICD-10-CM

## 2024-03-23 NOTE — Patient Instructions (Signed)
 Randall Hall , Thank you for taking time out of your busy schedule to complete your Annual Wellness Visit with me. I enjoyed our conversation and look forward to speaking with you again next year. I, as well as your care team,  appreciate your ongoing commitment to your health goals. Please review the following plan we discussed and let me know if I can assist you in the future. Your Game plan/ To Do List    Follow up Visits: Next Medicare AWV with our clinical staff: 03/24/25 at 3:50p.m.   Have you seen your provider in the last 6 months (3 months if uncontrolled diabetes)? Yes Next Office Visit with your provider: 06/18/24 at 10:30a.m.  Clinician Recommendations:  Aim for 30 minutes of exercise or brisk walking, 6-8 glasses of water, and 5 servings of fruits and vegetables each day.       This is a list of the screening recommended for you and due dates:  Health Maintenance  Topic Date Due   Zoster (Shingles) Vaccine (1 of 2) Never done   COVID-19 Vaccine (1 - 2024-25 season) Never done   Medicare Annual Wellness Visit  10/04/2023   Yearly kidney health urinalysis for diabetes  04/10/2024   Colon Cancer Screening  10/16/2024*   Flu Shot  03/26/2024   Complete foot exam   04/10/2024   Hemoglobin A1C  04/15/2024   Yearly kidney function blood test for diabetes  06/04/2024   Screening for Lung Cancer  10/19/2024   Eye exam for diabetics  01/26/2025   DTaP/Tdap/Td vaccine (2 - Td or Tdap) 10/16/2033   Pneumococcal Vaccine for age over 53  Completed   Hepatitis C Screening  Completed   Hepatitis B Vaccine  Aged Out   HPV Vaccine  Aged Out   Meningitis B Vaccine  Aged Out  *Topic was postponed. The date shown is not the original due date.    Advanced directives: (Declined) Advance directive discussed with you today. Even though you declined this today, please call our office should you change your mind, and we can give you the proper paperwork for you to fill out. Advance Care Planning  is important because it:  [x]  Makes sure you receive the medical care that is consistent with your values, goals, and preferences  [x]  It provides guidance to your family and loved ones and reduces their decisional burden about whether or not they are making the right decisions based on your wishes.  Follow the link provided in your after visit summary or read over the paperwork we have mailed to you to help you started getting your Advance Directives in place. If you need assistance in completing these, please reach out to us  so that we can help you!  See attachments for Preventive Care and Fall Prevention Tips.

## 2024-03-23 NOTE — Progress Notes (Signed)
 Subjective:   Randall Hall is a 68 y.o. who presents for a Medicare Wellness preventive visit.  As a reminder, Annual Wellness Visits don't include a physical exam, and some assessments may be limited, especially if this visit is performed virtually. We may recommend an in-person follow-up visit with your provider if needed.  Visit Complete: Virtual I connected with  Randall Hall on 03/23/24 by a audio enabled telemedicine application and verified that I am speaking with the correct person using two identifiers.  Patient Location: Home  Provider Location: Home Office  I discussed the limitations of evaluation and management by telemedicine. The patient expressed understanding and agreed to proceed.  Vital Signs: Because this visit was a virtual/telehealth visit, some criteria may be missing or patient reported. Any vitals not documented were not able to be obtained and vitals that have been documented are patient reported.  VideoDeclined- This patient declined Librarian, academic. Therefore the visit was completed with audio only.  Persons Participating in Visit: Patient.  AWV Questionnaire: No: Patient Medicare AWV questionnaire was not completed prior to this visit.  Cardiac Risk Factors include: advanced age (>78men, >69 women);diabetes mellitus;dyslipidemia;obesity (BMI >30kg/m2);smoking/ tobacco exposure     Objective:    Today's Vitals   03/23/24 1504  BP: 128/76  Weight: 239 lb (108.4 kg)  Height: 5' 6 (1.676 m)   Body mass index is 38.58 kg/m.     03/23/2024    2:55 PM 10/03/2022    9:18 AM 10/01/2021    4:35 PM 03/23/2017    2:00 AM 12/28/2016    6:45 PM 07/14/2016    9:44 PM 07/14/2016    4:51 PM  Advanced Directives  Does Patient Have a Medical Advance Directive? No Yes No  No  No  No   Type of Special educational needs teacher of [place];Living will       Copy of Healthcare Power of Attorney in Chart?  No - copy requested        Would patient like information on creating a medical advance directive?   No - Patient declined   No - patient declined information  No - patient declined information      Information is confidential and restricted. Go to Review Flowsheets to unlock data.   Data saved with a previous flowsheet row definition    Current Medications (verified) Outpatient Encounter Medications as of 03/23/2024  Medication Sig   aspirin  EC 81 MG tablet Take 1 tablet (81 mg total) by mouth daily.   atorvastatin  (LIPITOR) 40 MG tablet TAKE 1 TABLET EVERY DAY  AT  6PM   azelastine  (ASTELIN ) 0.1 % nasal spray Place 1 spray into both nostrils 2 (two) times daily.   Blood Glucose Monitoring Suppl DEVI UAD to check sugar daily.  May substitute to any manufacturer covered by patient's insurance. E11.9May substitute to any manufacturer covered by patient's insurance.   GARLIC  PO Take 1 tablet by mouth daily.   Glucose Blood (BLOOD GLUCOSE TEST STRIPS) STRP UAD to check sugar daily.  May substitute to any manufacturer covered by patient's insurance. E11.9   Lancet Device MISC UAD to check sugar daily.  May substitute to any manufacturer covered by patient's insurance. E11.9 May substitute to any manufacturer covered by patient's insurance.   Tadalafil  2.5 MG TABS Take 1 tablet (2.5 mg total) by mouth daily.   traZODone  (DESYREL ) 50 MG tablet Take 0.5-1 tablets (25-50 mg total) by mouth at bedtime as needed for sleep.  TURMERIC PO Take 1,000 mg by mouth daily.   VITAMIN E  PO Take 2 capsules by mouth daily.   No facility-administered encounter medications on file as of 03/23/2024.    Allergies (verified) Sulfa  antibiotics, Jardiance  [empagliflozin ], and Zocor [simvastatin]   History: Past Medical History:  Diagnosis Date   AKI (acute kidney injury) (HCC) 07/14/2016   CAD (coronary artery disease)    History of myocardial infarction    Hx of CABG    Hyperlipemia    MI (myocardial infarction) (HCC)    Tobacco  abuse    Traumatic closed fracture of distal clavicle with minimal displacement, right, initial encounter 03/22/2017   Past Surgical History:  Procedure Laterality Date   CARDIAC CATHETERIZATION N/A 07/16/2016   Procedure: Left Heart Cath and Cors/Grafts Angiography;  Surgeon: Lonni JONETTA Cash, MD;  [facility]AS[facility] CV LAB;  Service: Cardiovascular;  Laterality: N/A;   CARDIAC CATHETERIZATION N/A 07/16/2016   Procedure: Coronary Stent Intervention;  Surgeon: Lonni JONETTA Cash, MD;  Location: [facility] INVASIVE CV LAB;  Service: Cardiovascular;  Laterality: N/A;   CORONARY ANGIOPLASTY WITH STENT PLACEMENT     CORONARY ARTERY BYPASS GRAFT     2004 Dr. Lucas LIMA to the LAD, SVG to PDA and posterior lateral, SVG to diagonal, RIMA to obtuse marginal.  Last catheterization 2008.   LIPOMA EXCISION Left 07/02/2019   Procedure: MINOR EXCISION OF CYST LEFT THIGH;  Surgeon: Kallie Manuelita BROCKS, MD;  Location: [facility] OR[facility];  Service: General;  Laterality: Left;  Pt to arrive at 8:00am   Family History  Problem Relation Age of Onset   Diabetes Mother    CAD Father 69       Died   CAD Brother    Social History   Socioeconomic History   Marital status: Married    Spouse name: Not on file   Number of children: 4   Years of education: Not on file   Highest education level: Not on file  Occupational History   Occupation: TRUCK DRIVER    Employer: [employer]  Tobacco Use   Smoking status: Every Day    Current packs/day: 1.00    Average packs/day: 1 pack/day for 21.0 years (21.0 ttl pk-yrs)    Types: Cigarettes   Smokeless tobacco: Never  Vaping Use   Vaping status: Never Used  Substance and Sexual Activity   Alcohol use: Not Currently   Drug use: Not Currently   Sexual activity: Not on file  Other Topics Concern   Not on file  Social History Narrative   ** Merged History Encounter ** ** Data from: 12/28/16 [facility]ried.  He has seven grandchildren.  ** Data  from: 03/23/17 Enc Dept: [facility]   Lives with wife   Self-employed truck driver   Social Drivers of Health   Financial Resource Strain: Low Risk  (03/23/2024)   Overall Financial Resource Strain (CARDIA)    Difficulty of Paying Living Expenses: Not hard at all  Food Insecurity: No Food Insecurity (03/23/2024)   Hunger Vital Sign    Worried About Running Out of Food in the Last Year: Never true    Ran Out of Food in the Last Year: Never true  Transportation Needs: No Transportation Needs (03/23/2024)   PRAPARE - Administrator, Civil Service (Medical): No    Lack of Transportation (Non-Medical): No  Physical Activity: Inactive (03/23/2024)   Exercise Vital Sign    Days of Exercise per Week: 0 days  Minutes of Exercise per Session: 0 min  Stress: No Stress Concern Present (03/23/2024)   [employer] of Occupational Health - Occupational Stress Questionnaire    Feeling of Stress: Not at all  Social Connections: Moderately Integrated (03/23/2024)   Social Connection and Isolation Panel    Frequency of Communication with Friends and Family: More than three times a week    Frequency of Social Gatherings with Friends and Family: More than three times a week    Attends Religious Services: More than 4 times per year    Active Member of [employer] or Organizations: No    Attends Engineer, structural: Never    Marital Status: Married    Tobacco Counseling Ready to quit: No Counseling given: Yes    Clinical Intake:  Pre-visit preparation completed: Yes  Pain : No/denies pain     Nutritional Risks: None Diabetes: Yes CBG done?: No  Lab Results  Component Value Date   HGBA1C 6.2 (H) 10/17/2023   HGBA1C 6.6 (H) 04/11/2023   HGBA1C 6.5 (H) 09/20/2022     How often do you need to have someone help you when you read instructions, pamphlets, or other written materials from your doctor or pharmacy?: 1 - Never  Interpreter Needed?:  No  Information entered by :: alia t/cma   Activities of Daily Living     03/23/2024    2:53 PM  In your present state of health, do you have any difficulty performing the following activities:  Hearing? 0  Vision? 0  Difficulty concentrating or making decisions? 0  Walking or climbing stairs? 0  Dressing or bathing? 0  Doing errands, shopping? 0  Preparing Food and eating ? N  Using the Toilet? N  In the past six months, have you accidently leaked urine? N  Do you have problems with loss of bowel control? N  Managing your Medications? N  Managing your Finances? N  Housekeeping or managing your Housekeeping? N    Patient Care Team: Jolinda Norene HERO, DO as PCP - General (Family Medicine) Lavona Agent, MD as Consulting Physician (Cardiology) Ladora Ross Lacy Phebe, MD as Referring Physician (Optometry) Billee Mliss BIRCH, [facility] as Pharmacist (Family Medicine)  I have updated your Care Teams any recent Medical Services you may have received from other providers in the past year.     Assessment:   This is a routine wellness examination for Randall Hall.  Hearing/Vision screen Hearing Screening - Comments:: Pt denies hearing dif Vision Screening - Comments:: Pt denies vision dif/pt goes [facility] [place]/last 2025   Goals Addressed             This Visit's Progress    Patient Stated       Patient to pay of the mortgage off       Depression Screen     03/23/2024    2:57 PM 10/17/2023    1:31 PM 10/03/2022    9:17 AM 09/20/2022    2:07 PM 12/20/2021    4:15 PM 10/01/2021    4:31 PM 07/27/2021    4:18 PM  PHQ 2/9 Scores  PHQ - 2 Score 0 0 0 0 0 0 0  PHQ- 9 Score 0 0 0 0       Fall Risk     03/23/2024    2:50 PM 10/17/2023    1:32 PM 10/03/2022    9:14 AM 10/02/2022    3:17 PM 09/20/2022    2:07 PM  Fall Risk  Falls in the past year? 0 0 0 0 0  Number falls in past yr: 0 0 0 0 0  Injury with Fall? 0 0 0 0 0  Risk for fall due to : No Fall Risks No Fall Risks No  Fall Risks  No Fall Risks  Follow up Falls evaluation completed Falls evaluation completed Falls prevention discussed  Education provided    MEDICARE RISK AT HOME:  Medicare Risk at Home Any stairs in or around the home?: No If so, are there any without handrails?: No Home free of loose throw rugs in walkways, pet beds, electrical cords, etc?: Yes Adequate lighting in your home to reduce risk of falls?: Yes Life alert?: No Use of a cane, walker or w/c?: No Grab bars in the bathroom?: Yes Shower chair or bench in shower?: No Elevated toilet seat or a handicapped toilet?: No  TIMED UP AND GO:  Was the test performed?  no  Cognitive Function: 6CIT completed        03/23/2024    2:58 PM 10/03/2022    9:18 AM 10/01/2021    4:36 PM  6CIT Screen  What Year? 0 points 0 points 0 points  What month? 0 points 0 points 0 points  What time? 0 points 0 points 0 points  Count back from 20 0 points 0 points 0 points  Months in reverse 0 points 0 points 0 points  Repeat phrase 0 points 0 points 0 points  Total Score 0 points 0 points 0 points    Immunizations Immunization History  Administered Date(s) Administered   PNEUMOCOCCAL CONJUGATE-20 10/17/2023   Pneumococcal Conjugate-13 11/06/2020   Tdap 10/17/2023    Screening Tests Health Maintenance  Topic Date Due   Zoster Vaccines- Shingrix (1 of 2) Never done   COVID-19 Vaccine (1 - 2024-25 season) Never done   Diabetic kidney evaluation - Urine ACR  04/10/2024   Colonoscopy  10/16/2024 (Originally 07/11/2000)   INFLUENZA VACCINE  03/26/2024   FOOT EXAM  04/10/2024   HEMOGLOBIN A1C  04/15/2024   Diabetic kidney evaluation - eGFR measurement  06/04/2024   Lung Cancer Screening  10/19/2024   OPHTHALMOLOGY EXAM  01/26/2025   Medicare Annual Wellness (AWV)  03/23/2025   DTaP/Tdap/Td (2 - Td or Tdap) 10/16/2033   Pneumococcal Vaccine: 50+ Years  Completed   Hepatitis C Screening  Completed   Hepatitis B Vaccines  Aged Out   HPV  VACCINES  Aged Out   Meningococcal B Vaccine  Aged Out    Health Maintenance  Health Maintenance Due  Topic Date Due   Zoster Vaccines- Shingrix (1 of 2) Never done   COVID-19 Vaccine (1 - 2024-25 season) Never done   Diabetic kidney evaluation - Urine ACR  04/10/2024   Health Maintenance Items Addressed: See Nurse Notes at the end of this note  Additional Screening:  Vision Screening: Recommended annual ophthalmology exams for early detection of glaucoma and other disorders of the eye. Would you like a referral to an eye doctor? No    Dental Screening: Recommended annual dental exams for proper oral hygiene  Community Resource Referral / Chronic Care Management: CRR required this visit?  No   CCM required this visit?  No   Plan:    I have personally reviewed and noted the following in the patient's chart:   Medical and social history Use of alcohol, tobacco or illicit drugs  Current medications and supplements including opioid prescriptions. Patient is not currently taking opioid  prescriptions. Functional ability and status Nutritional status Physical activity Advanced directives List of other physicians Hospitalizations, surgeries, and ER visits in previous 12 months Vitals Screenings to include cognitive, depression, and falls Referrals and appointments  In addition, I have reviewed and discussed with patient certain preventive protocols, quality metrics, and best practice recommendations. A written personalized care plan for preventive services as well as general preventive health recommendations were provided to patient.   Randall Hall, CMA   03/23/2024   After Visit Summary: (MyChart) Due to this being a telephonic visit, the after visit summary with patients personalized plan was offered to patient via MyChart   Notes: Nothing significant to report at this time.

## 2024-06-18 ENCOUNTER — Ambulatory Visit: Payer: Medicare HMO | Admitting: Family Medicine

## 2024-06-18 ENCOUNTER — Encounter: Payer: Self-pay | Admitting: Family Medicine

## 2024-06-18 VITALS — BP 129/60 | HR 54 | Temp 97.8°F | Ht 65.0 in | Wt 236.1 lb

## 2024-06-18 DIAGNOSIS — E1169 Type 2 diabetes mellitus with other specified complication: Secondary | ICD-10-CM

## 2024-06-18 DIAGNOSIS — R351 Nocturia: Secondary | ICD-10-CM

## 2024-06-18 DIAGNOSIS — N401 Enlarged prostate with lower urinary tract symptoms: Secondary | ICD-10-CM | POA: Diagnosis not present

## 2024-06-18 DIAGNOSIS — I25700 Atherosclerosis of coronary artery bypass graft(s), unspecified, with unstable angina pectoris: Secondary | ICD-10-CM

## 2024-06-18 DIAGNOSIS — J3489 Other specified disorders of nose and nasal sinuses: Secondary | ICD-10-CM

## 2024-06-18 DIAGNOSIS — Z0001 Encounter for general adult medical examination with abnormal findings: Secondary | ICD-10-CM

## 2024-06-18 DIAGNOSIS — E118 Type 2 diabetes mellitus with unspecified complications: Secondary | ICD-10-CM

## 2024-06-18 DIAGNOSIS — Z6379 Other stressful life events affecting family and household: Secondary | ICD-10-CM | POA: Diagnosis not present

## 2024-06-18 DIAGNOSIS — Z0000 Encounter for general adult medical examination without abnormal findings: Secondary | ICD-10-CM

## 2024-06-18 DIAGNOSIS — L570 Actinic keratosis: Secondary | ICD-10-CM | POA: Diagnosis not present

## 2024-06-18 DIAGNOSIS — J432 Centrilobular emphysema: Secondary | ICD-10-CM | POA: Diagnosis not present

## 2024-06-18 DIAGNOSIS — E785 Hyperlipidemia, unspecified: Secondary | ICD-10-CM

## 2024-06-18 LAB — BAYER DCA HB A1C WAIVED: HB A1C (BAYER DCA - WAIVED): 6.4 % — ABNORMAL HIGH (ref 4.8–5.6)

## 2024-06-18 LAB — CBC WITH DIFFERENTIAL/PLATELET

## 2024-06-18 LAB — LIPID PANEL

## 2024-06-18 MED ORDER — ATORVASTATIN CALCIUM 40 MG PO TABS: ORAL_TABLET | ORAL | 3 refills | Status: AC

## 2024-06-18 MED ORDER — BUSPIRONE HCL 5 MG PO TABS
5.0000 mg | ORAL_TABLET | Freq: Three times a day (TID) | ORAL | 3 refills | Status: AC
Start: 2024-06-18 — End: ?

## 2024-06-18 MED ORDER — AZELASTINE HCL 0.1 % NA SOLN: 1.0000 | Freq: Two times a day (BID) | NASAL | 12 refills | Status: AC

## 2024-06-18 MED ORDER — TADALAFIL 2.5 MG PO TABS: 1.0000 | ORAL_TABLET | Freq: Every day | ORAL | 3 refills | Status: AC

## 2024-06-18 MED ORDER — TAMSULOSIN HCL 0.4 MG PO CAPS
0.4000 mg | ORAL_CAPSULE | Freq: Every day | ORAL | 3 refills | Status: AC
Start: 2024-06-18 — End: ?

## 2024-06-18 NOTE — Progress Notes (Signed)
 Randall Hall is a 68 y.o. male presents to office today for annual physical exam examination.     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  Last eye exam: UTD Last foot exam: needs Last A1c:  Lab Results  Component Value Date   HGBA1C 6.2 (H) 10/17/2023   Nephropathy screen indicated?: needs Last flu, zoster and/or pneumovax:  Immunization History  Administered Date(s) Administered   PNEUMOCOCCAL CONJUGATE-20 10/17/2023   Pneumococcal Conjugate-13 11/06/2020   Tdap 10/17/2023    ROS: Denies dizziness, LOC, polyuria, polydipsia, unintended weight loss/gain, foot ulcerations, numbness or tingling in extremities, shortness of breath or chest pain.  He does report ongoing nocturia despite use of Cialis  daily.  Admits to stress related to his wife's diagnosis of cancer.  He has subsequently started smoking again and is smoking 2 to 3 cigarettes/day.  No hemoptysis, shortness of breath, etc.  Willing to go on medication if needed for nerves  Occupation: drives a truck, Marital status: married, Substance use: tobacco, rare ETOH Health Maintenance Due  Topic Date Due   Zoster Vaccines- Shingrix (1 of 2) Never done   Influenza Vaccine  Never done   Diabetic kidney evaluation - Urine ACR  04/10/2024   FOOT EXAM  04/10/2024   HEMOGLOBIN A1C  04/15/2024   COVID-19 Vaccine (1 - 2025-26 season) Never done   Diabetic kidney evaluation - eGFR measurement  06/04/2024    Immunization History  Administered Date(s) Administered   PNEUMOCOCCAL CONJUGATE-20 10/17/2023   Pneumococcal Conjugate-13 11/06/2020   Tdap 10/17/2023   Past Medical History:  Diagnosis Date   AKI (acute kidney injury) 07/14/2016   CAD (coronary artery disease)    History of myocardial infarction    Hx of CABG    Hyperlipemia    MI (myocardial infarction) (HCC)    Tobacco abuse     Traumatic closed fracture of distal clavicle with minimal displacement, right, initial encounter 03/22/2017   Social History   Socioeconomic History   Marital status: Married    Spouse name: Not on file   Number of children: 4   Years of education: Not on file   Highest education level: Not on file  Occupational History   Occupation: TRUCK DRIVER    Employer: [employer]  Tobacco Use   Smoking status: Every Day    Current packs/day: 1.00    Average packs/day: 1 pack/day for 21.0 years (21.0 ttl pk-yrs)    Types: Cigarettes   Smokeless tobacco: Never  Vaping Use   Vaping status: Never Used  Substance and Sexual Activity   Alcohol use: Not Currently   Drug use: Not Currently   Sexual activity: Not on file  Other Topics Concern   Not on file  Social History Narrative   ** Merged History Encounter ** ** Data from: 12/28/16 [facility].  He has seven grandchildren.  ** Data from: 03/23/17 Enc Dept: [facility]   Lives with wife   Self-employed truck driver   Social Drivers of Health   Financial Resource Strain: Low Risk  (03/23/2024)   Overall Financial Resource Strain (CARDIA)    Difficulty of Paying Living Expenses: Not hard at all  Food Insecurity: No Food Insecurity (03/23/2024)   Hunger Vital Sign    Worried About Running Out of Food in the Last Year: Never true    Ran Out of  Food in the Last Year: Never true  Transportation Needs: No Transportation Needs (03/23/2024)   PRAPARE - Administrator, Civil Service (Medical): No    Lack of Transportation (Non-Medical): No  Physical Activity: Inactive (03/23/2024)   Exercise Vital Sign    Days of Exercise per Week: 0 days    Minutes of Exercise per Session: 0 min  Stress: No Stress Concern Present (03/23/2024)   [employer] of Occupational Health - Occupational Stress Questionnaire    Feeling of Stress: Not at all  Social Connections: Moderately Integrated (03/23/2024)    Social Connection and Isolation Panel    Frequency of Communication with Friends and Family: More than three times a week    Frequency of Social Gatherings with Friends and Family: More than three times a week    Attends Religious Services: More than 4 times per year    Active Member of [employer] or Organizations: No    Attends Banker Meetings: Never    Marital Status: Married  Catering manager Violence: Not At Risk (03/23/2024)   Humiliation, Afraid, Rape, and Kick questionnaire    Fear of Current or Ex-Partner: No    Emotionally Abused: No    Physically Abused: No    Sexually Abused: No   Past Surgical History:  Procedure Laterality Date   CARDIAC CATHETERIZATION N/A 07/16/2016   Procedure: Left Heart Cath and Cors/Grafts Angiography;  Surgeon: Lonni JONETTA Cash, MD;  Location: [facility];  Service: Cardiovascular;  Laterality: N/A;   CARDIAC CATHETERIZATION N/A 07/16/2016   Procedure: Coronary Stent Intervention;  Surgeon: Lonni JONETTA Cash, MD;  Location: [facility] INVASIVE CV LAB;  Service: Cardiovascular;  Laterality: N/A;   CORONARY ANGIOPLASTY WITH STENT PLACEMENT     CORONARY ARTERY BYPASS GRAFT     2004 Dr. Lucas LIMA to the LAD, SVG to PDA and posterior lateral, SVG to diagonal, RIMA to obtuse marginal.  Last catheterization 2008.   LIPOMA EXCISION Left 07/02/2019   Procedure: MINOR EXCISION OF CYST LEFT THIGH;  Surgeon: Kallie Manuelita BROCKS, MD;  Location: [facility];  Service: General;  Laterality: Left;  Pt to arrive at 8:00am   Family History  Problem Relation Age of Onset   Diabetes Mother    CAD Father 61       Died   CAD Brother     Current Outpatient Medications:    aspirin  EC 81 MG tablet, Take 1 tablet (81 mg total) by mouth daily., Disp: 90 tablet, Rfl: 3   atorvastatin  (LIPITOR) 40 MG tablet, TAKE 1 TABLET EVERY DAY  AT  6PM, Disp: 90 tablet, Rfl: 3   azelastine  (ASTELIN ) 0.1 % nasal spray, Place 1 spray into both nostrils 2 (two) times  daily., Disp: 30 mL, Rfl: 12   Blood Glucose Monitoring Suppl DEVI, UAD to check sugar daily.  May substitute to any manufacturer covered by patient's insurance. E11.9May substitute to any manufacturer covered by patient's insurance., Disp: 1 each, Rfl: 0   GARLIC  PO, Take 1 tablet by mouth daily., Disp: , Rfl:    Glucose Blood (BLOOD GLUCOSE TEST STRIPS) STRP, UAD to check sugar daily.  May substitute to any manufacturer covered by patient's insurance. E11.9, Disp: 100 strip, Rfl: PRN   Lancet Device MISC, UAD to check sugar daily.  May substitute to any manufacturer covered by patient's insurance. E11.9 May substitute to any manufacturer covered by patient's insurance., Disp: 100 each, Rfl: PRN   Tadalafil  2.5 MG TABS, Take 1 tablet (  2.5 mg total) by mouth daily., Disp: 90 tablet, Rfl: 3   traZODone  (DESYREL ) 50 MG tablet, Take 0.5-1 tablets (25-50 mg total) by mouth at bedtime as needed for sleep., Disp: 30 tablet, Rfl: 3   TURMERIC PO, Take 1,000 mg by mouth daily., Disp: , Rfl:    VITAMIN E  PO, Take 2 capsules by mouth daily., Disp: , Rfl:   Allergies  Allergen Reactions   Sulfa  Antibiotics Anaphylaxis    hives swelling   Jardiance  [Empagliflozin ]     Caused flank pain   Zocor [Simvastatin] Other (See Comments)    MYOPATHY     ROS: Review of Systems Pertinent items noted in HPI and remainder of comprehensive ROS otherwise negative.    Physical exam BP 129/60   Pulse (!) 54   Temp 97.8 F (36.6 C)   Ht 5' 5 (1.651 m)   Wt 236 lb 2 oz (107.1 kg)   SpO2 94%   BMI 39.29 kg/m  General appearance: alert, cooperative, appears stated age, no distress, and moderately obese Head: Normocephalic, without obvious abnormality, atraumatic Eyes: negative findings: lids and lashes normal, conjunctivae and sclerae normal, corneas clear, and pupils equal, round, reactive to light and accomodation Ears: normal TM's and external ear canals both ears Nose: Nares normal. Septum midline. Mucosa  normal. No drainage or sinus tenderness. Throat: lips, mucosa, and tongue normal; teeth and gums normal Neck: no adenopathy, no carotid bruit, supple, symmetrical, trachea midline, and thyroid not enlarged, symmetric, no tenderness/mass/nodules Back: symmetric, no curvature. ROM normal. No CVA tenderness. Lungs: Coarse breath sounds throughout.  Normal work of breathing on room air. Chest wall: no tenderness Heart: regular rate and rhythm, S1, S2 normal, no murmur, click, rub or gallop Abdomen: Protuberant, soft, nontender, nondistended Extremities: extremities normal, atraumatic, no cyanosis or edema Pulses: 2+ and symmetric Skin: Has several solar lentigo.  Very large seborrheic keratosis noted along the right apex of the scalp.  He has a smaller actinic keratosis noted at the posterior apex of the scalp along with similar lesions along the dorsal aspects of bilateral wrists  Lymph nodes: No supraclavicular or anterior cervical lymph node enlargement Neurologic: Grossly normal      03/23/2024    2:57 PM 10/17/2023    1:31 PM 10/03/2022    9:17 AM  Depression screen PHQ 2/9  Decreased Interest 0 0 0  Down, Depressed, Hopeless 0 0 0  PHQ - 2 Score 0 0 0  Altered sleeping 0 0 0  Tired, decreased energy 0 0 0  Change in appetite 0 0 0  Feeling bad or failure about yourself  0 0 0  Trouble concentrating 0 0 0  Moving slowly or fidgety/restless 0 0 0  Suicidal thoughts 0 0 0  PHQ-9 Score 0 0 0  Difficult doing work/chores Not difficult at all Not difficult at all Not difficult at all      10/17/2023    1:31 PM 09/20/2022    2:07 PM 12/20/2021    4:15 PM 07/27/2021    4:18 PM  GAD 7 : Generalized Anxiety Score  Nervous, Anxious, on Edge 0 0 0 0  Control/stop worrying 0 0 0 0  Worry too much - different things 0 0 0 0  Trouble relaxing 0 0 0 0  Restless 0 0 0 0  Easily annoyed or irritable 0 0 0 0  Afraid - awful might happen 0 0 0 0  Total GAD 7 Score 0 0 0 0  Anxiety  Difficulty  Not difficult at all Not difficult at all Not difficult at all Not difficult at all    No results found for this or any previous visit (from the past 2160 hours).  Diabetic Foot Exam - Simple   Simple Foot Form Diabetic Foot exam was performed with the following findings: Yes 06/18/2024 11:05 AM  Visual Inspection No deformities, no ulcerations, no other skin breakdown bilaterally: Yes Sensation Testing Intact to touch and monofilament testing bilaterally: Yes Pulse Check Posterior Tibialis and Dorsalis pulse intact bilaterally: Yes Comments    Cryotherapy Procedure:  Risks and benefits of procedure were reviewed with the patient.  Written consent obtained and scanned into the chart.  Lesion of concern was identified and located on dorsum of left and right hand (x1 each) and apex of posterior scalp x1.  Liquid nitrogen was applied to area of concern and extending out 1 millimeters beyond the border of the lesion.  Treated area was allowed to come back to room temperature before treating it a second time.  Patient tolerated procedure well and there were no immediate complications.  Home care instructions were reviewed with the patient and a handout was provided.  Assessment/ Plan: Norleen Groves here for annual physical exam.   Annual physical exam  Stress due to illness of family member - Plan: busPIRone (BUSPAR) 5 MG tablet  Benign prostatic hyperplasia with nocturia - Plan: CMP14+EGFR, PSA, Tadalafil  2.5 MG TABS, tamsulosin (FLOMAX) 0.4 MG CAPS capsule  Actinic keratoses  Centrilobular emphysema (HCC) - Plan: CMP14+EGFR, CBC with Differential  Morbid obesity (HCC) - Plan: CMP14+EGFR, VITAMIN D 25 Hydroxy (Vit-D Deficiency, Fractures)  Type 2 diabetes mellitus with complication, without long-term current use of insulin (HCC) - Plan: Microalbumin / creatinine urine ratio, Bayer DCA Hb A1c Waived, CMP14+EGFR, CBC with Differential  Hyperlipidemia associated with type 2 diabetes  mellitus (HCC) - Plan: CMP14+EGFR, Lipid Panel, atorvastatin  (LIPITOR) 40 MG tablet  Coronary artery disease involving coronary bypass graft of native heart with unstable angina pectoris (HCC) - Plan: atorvastatin  (LIPITOR) 40 MG tablet  Rhinorrhea - Plan: azelastine  (ASTELIN ) 0.1 % nasal spray   Labs collected today.  Patient declines vaccination.  Discussed that this may be beneficial, particularly given need for protection of his wife, who is undergoing immunotherapy for cancer  I have added BuSpar for stress.  Flomax along with ongoing use of Cialis  for BPH with ongoing symptoms  Actinic keratoses treated with cryoablation today  Diabetic foot exam performed.  Check urine microalbumin, nonfasting labs.  Type 2 diabetes has been diet controlled.  He denied needing any more testing supplies today  Continue statin given history of CAD  Astelin  renewed as rhinorrhea is chronic and stable  Counseled on healthy lifestyle choices, including diet (rich in fruits, vegetables and lean meats and low in salt and simple carbohydrates) and exercise (at least 30 minutes of moderate physical activity daily).  Patient to follow up 6 months  Xavier Fournier M. Jolinda, DO

## 2024-06-18 NOTE — Patient Instructions (Addendum)
 Flomax added today for prostate/ urination Buspar if needed for nerves You had PREcancerous spots frozen off today. STOP SMOKING Consider flu shot and shingles shot to protect your wife while she is undergoing treatments

## 2024-06-19 LAB — CBC WITH DIFFERENTIAL/PLATELET
Basophils Absolute: 0.1 x10E3/uL (ref 0.0–0.2)
Basos: 1 %
EOS (ABSOLUTE): 0.2 x10E3/uL (ref 0.0–0.4)
Eos: 2 %
Hematocrit: 47.9 % (ref 37.5–51.0)
Hemoglobin: 15.7 g/dL (ref 13.0–17.7)
Immature Grans (Abs): 0.1 x10E3/uL (ref 0.0–0.1)
Immature Granulocytes: 1 %
Lymphocytes Absolute: 3.1 x10E3/uL (ref 0.7–3.1)
Lymphs: 30 %
MCH: 32 pg (ref 26.6–33.0)
MCHC: 32.8 g/dL (ref 31.5–35.7)
MCV: 98 fL — ABNORMAL HIGH (ref 79–97)
Monocytes Absolute: 0.9 x10E3/uL (ref 0.1–0.9)
Monocytes: 9 %
Neutrophils Absolute: 6.1 x10E3/uL (ref 1.4–7.0)
Neutrophils: 57 %
Platelets: 196 x10E3/uL (ref 150–450)
RBC: 4.9 x10E6/uL (ref 4.14–5.80)
RDW: 12.3 % (ref 11.6–15.4)
WBC: 10.4 x10E3/uL (ref 3.4–10.8)

## 2024-06-19 LAB — CMP14+EGFR
ALT: 16 IU/L (ref 0–44)
AST: 18 IU/L (ref 0–40)
Albumin: 4.1 g/dL (ref 3.9–4.9)
Alkaline Phosphatase: 92 IU/L (ref 47–123)
BUN/Creatinine Ratio: 16 (ref 10–24)
BUN: 21 mg/dL (ref 8–27)
Bilirubin Total: 0.3 mg/dL (ref 0.0–1.2)
CO2: 23 mmol/L (ref 20–29)
Calcium: 9.5 mg/dL (ref 8.6–10.2)
Chloride: 101 mmol/L (ref 96–106)
Creatinine, Ser: 1.32 mg/dL — ABNORMAL HIGH (ref 0.76–1.27)
Globulin, Total: 2.4 g/dL (ref 1.5–4.5)
Glucose: 72 mg/dL (ref 70–99)
Potassium: 4.4 mmol/L (ref 3.5–5.2)
Sodium: 139 mmol/L (ref 134–144)
Total Protein: 6.5 g/dL (ref 6.0–8.5)
eGFR: 59 mL/min/1.73 — ABNORMAL LOW (ref >59)

## 2024-06-19 LAB — LIPID PANEL
Chol/HDL Ratio: 6.5 ratio — ABNORMAL HIGH (ref 0.0–5.0)
Cholesterol, Total: 221 mg/dL — ABNORMAL HIGH (ref 100–199)
HDL: 34 mg/dL — ABNORMAL LOW (ref >39)
LDL Chol Calc (NIH): 140 mg/dL — ABNORMAL HIGH (ref 0–99)
Triglycerides: 256 mg/dL — ABNORMAL HIGH (ref 0–149)
VLDL Cholesterol Cal: 47 mg/dL — ABNORMAL HIGH (ref 5–40)

## 2024-06-19 LAB — MICROALBUMIN / CREATININE URINE RATIO
Creatinine, Urine: 116.7 mg/dL
Microalb/Creat Ratio: 3 mg/g{creat} (ref 0–29)
Microalbumin, Urine: 4 ug/mL

## 2024-06-19 LAB — VITAMIN D 25 HYDROXY (VIT D DEFICIENCY, FRACTURES): Vit D, 25-Hydroxy: 37 ng/mL (ref 30.0–100.0)

## 2024-06-19 LAB — PSA: Prostate Specific Ag, Serum: 2.3 ng/mL (ref 0.0–4.0)

## 2024-06-21 ENCOUNTER — Ambulatory Visit: Payer: Self-pay | Admitting: Family Medicine

## 2024-09-22 ENCOUNTER — Encounter: Payer: Self-pay | Admitting: Acute Care

## 2024-12-17 ENCOUNTER — Ambulatory Visit: Payer: Self-pay | Admitting: Family Medicine

## 2025-03-24 ENCOUNTER — Ambulatory Visit: Payer: Self-pay

## 2025-03-25 ENCOUNTER — Ambulatory Visit

## 2025-06-21 ENCOUNTER — Encounter: Payer: Self-pay | Admitting: Family Medicine
